# Patient Record
Sex: Female | Born: 1935 | Race: White | Hispanic: No | Smoking: Current every day smoker
Health system: Southern US, Community
[De-identification: ages and names within clinical notes are randomized; demographics above are authoritative.]

## PROBLEM LIST (undated history)

## (undated) DIAGNOSIS — K449 Diaphragmatic hernia without obstruction or gangrene: Secondary | ICD-10-CM

## (undated) DIAGNOSIS — I6529 Occlusion and stenosis of unspecified carotid artery: Secondary | ICD-10-CM

## (undated) DIAGNOSIS — K838 Other specified diseases of biliary tract: Secondary | ICD-10-CM

## (undated) DIAGNOSIS — I639 Cerebral infarction, unspecified: Secondary | ICD-10-CM

## (undated) DIAGNOSIS — R112 Nausea with vomiting, unspecified: Secondary | ICD-10-CM

## (undated) DIAGNOSIS — K8020 Calculus of gallbladder without cholecystitis without obstruction: Secondary | ICD-10-CM

## (undated) DIAGNOSIS — C801 Malignant (primary) neoplasm, unspecified: Secondary | ICD-10-CM

## (undated) DIAGNOSIS — Z9889 Other specified postprocedural states: Secondary | ICD-10-CM

## (undated) DIAGNOSIS — I700 Atherosclerosis of aorta: Secondary | ICD-10-CM

## (undated) DIAGNOSIS — I2510 Atherosclerotic heart disease of native coronary artery without angina pectoris: Secondary | ICD-10-CM

## (undated) DIAGNOSIS — E785 Hyperlipidemia, unspecified: Secondary | ICD-10-CM

## (undated) DIAGNOSIS — R51 Headache: Secondary | ICD-10-CM

## (undated) DIAGNOSIS — I219 Acute myocardial infarction, unspecified: Secondary | ICD-10-CM

## (undated) HISTORY — DX: Hyperlipidemia, unspecified: E78.5

## (undated) HISTORY — DX: Diaphragmatic hernia without obstruction or gangrene: K44.9

## (undated) HISTORY — DX: Other specified diseases of biliary tract: K83.8

## (undated) HISTORY — DX: Cerebral infarction, unspecified: I63.9

## (undated) HISTORY — PX: TONSILLECTOMY: SUR1361

## (undated) HISTORY — DX: Headache: R51

## (undated) HISTORY — PX: APPENDECTOMY: SHX54

## (undated) HISTORY — PX: ROTATOR CUFF REPAIR: SHX139

## (undated) HISTORY — PX: ANKLE FRACTURE SURGERY: SHX122

## (undated) HISTORY — DX: Calculus of gallbladder without cholecystitis without obstruction: K80.20

## (undated) HISTORY — DX: Atherosclerotic heart disease of native coronary artery without angina pectoris: I25.10

## (undated) HISTORY — PX: BLADDER SURGERY: SHX569

## (undated) HISTORY — PX: ABDOMINAL HYSTERECTOMY: SHX81

## (undated) HISTORY — DX: Acute myocardial infarction, unspecified: I21.9

## (undated) HISTORY — DX: Occlusion and stenosis of unspecified carotid artery: I65.29

## (undated) HISTORY — DX: Atherosclerosis of aorta: I70.0

---

## 1997-10-08 ENCOUNTER — Inpatient Hospital Stay (HOSPITAL_COMMUNITY): Admission: RE | Admit: 1997-10-08 | Discharge: 1997-10-10 | Payer: Self-pay | Admitting: Orthopedic Surgery

## 2001-05-22 DIAGNOSIS — I639 Cerebral infarction, unspecified: Secondary | ICD-10-CM

## 2001-05-22 HISTORY — DX: Cerebral infarction, unspecified: I63.9

## 2002-01-19 ENCOUNTER — Inpatient Hospital Stay (HOSPITAL_COMMUNITY): Admission: EM | Admit: 2002-01-19 | Discharge: 2002-01-24 | Payer: Self-pay

## 2002-01-21 ENCOUNTER — Encounter (INDEPENDENT_AMBULATORY_CARE_PROVIDER_SITE_OTHER): Payer: Self-pay | Admitting: Cardiology

## 2002-01-22 ENCOUNTER — Encounter: Payer: Self-pay | Admitting: Neurology

## 2002-01-23 ENCOUNTER — Encounter: Payer: Self-pay | Admitting: Neurology

## 2002-07-01 ENCOUNTER — Ambulatory Visit (HOSPITAL_COMMUNITY): Admission: RE | Admit: 2002-07-01 | Discharge: 2002-07-01 | Payer: Self-pay | Admitting: Neurology

## 2003-01-01 ENCOUNTER — Ambulatory Visit (HOSPITAL_COMMUNITY): Admission: RE | Admit: 2003-01-01 | Discharge: 2003-01-01 | Payer: Self-pay | Admitting: Neurology

## 2003-07-05 ENCOUNTER — Inpatient Hospital Stay (HOSPITAL_COMMUNITY): Admission: EM | Admit: 2003-07-05 | Discharge: 2003-07-09 | Payer: Self-pay | Admitting: Emergency Medicine

## 2003-10-14 ENCOUNTER — Ambulatory Visit (HOSPITAL_COMMUNITY): Admission: RE | Admit: 2003-10-14 | Discharge: 2003-10-14 | Payer: Self-pay | Admitting: Cardiology

## 2005-07-28 ENCOUNTER — Encounter: Admission: RE | Admit: 2005-07-28 | Discharge: 2005-07-28 | Payer: Self-pay | Admitting: Orthopedic Surgery

## 2006-09-04 ENCOUNTER — Ambulatory Visit: Payer: Self-pay | Admitting: Cardiology

## 2006-11-16 ENCOUNTER — Ambulatory Visit: Payer: Self-pay | Admitting: Vascular Surgery

## 2007-11-25 ENCOUNTER — Ambulatory Visit: Payer: Self-pay | Admitting: Cardiology

## 2007-11-27 ENCOUNTER — Ambulatory Visit: Payer: Self-pay | Admitting: Vascular Surgery

## 2008-06-01 ENCOUNTER — Encounter: Admission: RE | Admit: 2008-06-01 | Discharge: 2008-06-01 | Payer: Self-pay | Admitting: Family Medicine

## 2008-12-09 ENCOUNTER — Encounter: Payer: Self-pay | Admitting: Cardiology

## 2008-12-09 ENCOUNTER — Ambulatory Visit: Payer: Self-pay | Admitting: Vascular Surgery

## 2009-02-17 DIAGNOSIS — I6350 Cerebral infarction due to unspecified occlusion or stenosis of unspecified cerebral artery: Secondary | ICD-10-CM | POA: Insufficient documentation

## 2009-02-17 DIAGNOSIS — K2980 Duodenitis without bleeding: Secondary | ICD-10-CM | POA: Insufficient documentation

## 2009-02-17 DIAGNOSIS — I6529 Occlusion and stenosis of unspecified carotid artery: Secondary | ICD-10-CM | POA: Insufficient documentation

## 2009-02-17 DIAGNOSIS — I2510 Atherosclerotic heart disease of native coronary artery without angina pectoris: Secondary | ICD-10-CM | POA: Insufficient documentation

## 2009-02-17 DIAGNOSIS — E785 Hyperlipidemia, unspecified: Secondary | ICD-10-CM

## 2009-02-17 DIAGNOSIS — F411 Generalized anxiety disorder: Secondary | ICD-10-CM | POA: Insufficient documentation

## 2009-02-18 ENCOUNTER — Ambulatory Visit: Payer: Self-pay | Admitting: Cardiology

## 2009-02-18 DIAGNOSIS — Z720 Tobacco use: Secondary | ICD-10-CM

## 2009-03-03 ENCOUNTER — Ambulatory Visit (HOSPITAL_COMMUNITY): Admission: RE | Admit: 2009-03-03 | Discharge: 2009-03-03 | Payer: Self-pay | Admitting: Cardiology

## 2009-03-03 ENCOUNTER — Ambulatory Visit: Payer: Self-pay

## 2009-03-03 ENCOUNTER — Ambulatory Visit: Payer: Self-pay | Admitting: Cardiology

## 2009-03-03 ENCOUNTER — Encounter: Payer: Self-pay | Admitting: Cardiology

## 2009-03-16 ENCOUNTER — Telehealth: Payer: Self-pay | Admitting: Cardiology

## 2010-01-19 ENCOUNTER — Ambulatory Visit: Payer: Self-pay | Admitting: Vascular Surgery

## 2010-10-04 NOTE — Procedures (Signed)
CAROTID DUPLEX EXAM   INDICATION:  Followup carotid stenosis.   HISTORY:  Diabetes:  No.  Cardiac:  CAD with stent in 2005.  Hypertension:  No.  Smoking:  Yes.  Previous Surgery:  No.  CV History:  Currently asymptomatic.  Amaurosis Fugax No, Paresthesias No, Hemiparesis No                                       RIGHT             LEFT  Brachial systolic pressure:         134               130  Brachial Doppler waveforms:         Normal            Normal  Vertebral direction of flow:        Antegrade  DUPLEX VELOCITIES (cm/sec)  CCA peak systolic                   63  ECA peak systolic                   80  ICA peak systolic                   131  ICA end diastolic                   36  PLAQUE MORPHOLOGY:                  Mixed  PLAQUE AMOUNT:                      Mild  PLAQUE LOCATION:                    ICA   IMPRESSION:  1. Doppler velocity suggests a 40%-59% stenosis of the right proximal      internal carotid artery.  2. The left carotid system was not evaluated due to known functional      occlusion of the left internal carotid artery.  3. Mildly increased velocities of the right internal carotid artery      noted when compared to the previous exam on 12/09/2008.   ___________________________________________  Janetta Hora. Fields, MD   CH/MEDQ  D:  01/20/2010  T:  01/20/2010  Job:  [id number]

## 2010-10-04 NOTE — Assessment & Plan Note (Signed)
OFFICE VISIT   [name], [name]  DOB:  September 14, 1928                                       11/27/2007  ZOXWR#:[id number]   The patient returns for followup today.  She has had a previous left  internal carotid artery occlusion.  We have been following her for mild  asymptomatic right internal carotid artery disease.  She had a stroke in  August of 2003.  She has had no symptoms since then.  She states she  does get a little bit dizzy if she stands up too quickly, but otherwise  has been doing well.  She is trying to quit smoking.  She is down from 2  packs a day to 1/2 pack per day.  She continues to take 1 aspirin daily.   PHYSICAL EXAM:  Today blood pressure is 133/68 in the left arm, 147/64  in the right arm, pulse is 70 and regular.  HEENT is unremarkable.  She  has 2+ carotid pulses without bruit.  Chest:  Clear to auscultation.  Cardiac exam is regular rate and rhythm without murmur.  Abdomen is  soft, nontender without mass.  Extremities:  She has 2+ femoral pulses.   She had a repeat carotid duplex exam today which showed less than 40%  stenosis of the right internal carotid artery and then a chronic  occlusion of the left internal carotid artery.  This is essentially  unchanged from 1 year ago.  Overall, the patient is doing well.  Hopefully, she will be able to quit smoking.  She will continue her  aspirin daily.  Will repeat her duplex exam in 1 year's time.   Cc Dr. Jeannetta Nap and Dr. Riley Kill office note and duplex report.   Janetta Hora. Fields, MD  Electronically Signed   CEF/MEDQ  D:  11/27/2007  T:  11/28/2007  Job:  [id number]   cc:   Arturo Morton. Riley Kill, MD, [facility]  Windle Guard, M.D.

## 2010-10-04 NOTE — Procedures (Signed)
CAROTID DUPLEX EXAM   INDICATION:  Followup, carotid artery disease, known left ICA occlusion.   HISTORY:  Diabetes:  No.  Cardiac:  CAD.  Hypertension:  No.  Smoking:  No.  Previous Surgery:  No.  CV History:  Left CVA in 2003.  Amaurosis Fugax No, Paresthesias No, Hemiparesis No.                                       RIGHT             LEFT  Brachial systolic pressure:         162               160  Brachial Doppler waveforms:         Normal            Normal  Vertebral direction of flow:        Antegrade         Antegrade  DUPLEX VELOCITIES (cm/sec)  CCA peak systolic                   79                84  ECA peak systolic                   91                107  ICA peak systolic                   99                Occluded  ICA end diastolic                   23  PLAQUE MORPHOLOGY:                  Heterogenous      Calcific  PLAQUE AMOUNT:                      Mild              Moderate-to-severe  PLAQUE LOCATION:                    ICA/ECA           ICA   IMPRESSION:  1. 1-39% stenosis of the right internal carotid artery.  2. Known occlusion of the left internal carotid artery, by      arteriogram, with elevated velocities of >250 cm/s noted in the      left proximal internal carotid artery region, which appeared to be      a possible collateral vessel.  3. No significant change noted from the previous examination on      11/16/06.   ___________________________________________  Janetta Hora. Fields, MD   CH/MEDQ  D:  11/27/2007  T:  11/27/2007  Job:  [id number]

## 2010-10-04 NOTE — Letter (Signed)
November 25, 2007    Windle Guard, M.D.  [address]  [place], [place] [address].   RE:  [name], [name]  MRN:  [id number]  /  DOB:  08-12-1928   Dear Andrey Campanile:   I had the pleasure of seeing [name] in the office today in  followup.  As you know, she continues to smoke about half a pack of  cigarettes a day.  She does have carotid disease and is scheduled to see  Dr. Darrick Penna.  She gave me her lipid numbers, and these look really quite  encouraging.  I know she is on an aggressive lipid regimen which  includes small doses of light niacin, lovastatin as well as gemfibrozil.  Her HDL was 79, LDL 69, and triglycerides 54.   PHYSICAL EXAMINATION:  VITALS:  Today on physical, the blood pressure  was 160/80, but then when rechecked by me it was 140/80 with a pulse of  68.  HEENT:  Interestingly, I could not hear loud carotid bruits.  LUNGS:  The lung fields were clear to auscultation and percussion with  prolonged expiration.  HEART:  Cardiac rhythm was regular.   The electrocardiogram demonstrates normal sinus rhythm with criteria for  LVH voltage-wise, but without other changes.   Overall, she seems to be doing well from a cardiac standpoint.  Her  lipids are relatively optimal.  She had a previously placed non drug-  eluting stent for acute myocardial infarction.  She does need follow up  on her carotids and this will be done soon.  With regard to her lipid  situation, I do think that perhaps she might be able to get by without  her gemfibrozil, but certainly would leave this up to your  recommendations.   I appreciate the opportunity of sharing this nice lady's care.    Sincerely,       Arturo Morton. Riley Kill, MD, [facility]      Arturo Morton. Riley Kill, MD, [facility]    TDS/MedQ  DD: 11/25/2007  DT: 11/25/2007  Job #: [id number]

## 2010-10-04 NOTE — Procedures (Signed)
CAROTID DUPLEX EXAM   INDICATION:  Followup evaluation of known carotid artery disease.  The  patient has a known left ICA occlusion by angiogram.   HISTORY:  Diabetes:  No.  Cardiac:  Coronary artery disease stent on 02/02/2004.  Hypertension:  No.  Smoking:  Yes.  Previous Surgery:  No.  CV History:  The patient had a stroke in 2003.  Previous duplex on  11/27/2007 revealed a 1-39% right ICA stenosis and an occlusion at the  left ICA.  The patient reports no cerebrovascular symptoms at this time.  Amaurosis Fugax No, Paresthesias No, Hemiparesis No                                       RIGHT             LEFT  Brachial systolic pressure:         168               172  Brachial Doppler waveforms:         Triphasic         Triphasic  Vertebral direction of flow:        Antegrade         Antegrade  DUPLEX VELOCITIES (cm/sec)  CCA peak systolic                   71                56  ECA peak systolic                   90                85  ICA peak systolic                   106               Occluded  ICA end diastolic                   33                Occluded  PLAQUE MORPHOLOGY:                  Mixed             Mixed  PLAQUE AMOUNT:                      Mild              Severe  PLAQUE LOCATION:                    Proximal ICA      Throughout ICA   IMPRESSION:  1. 20-39% right ICA stenosis.  2. Although the left ICA is functionally occluded by angiogram there      is a very small flow channel with a peak systolic velocity of 381      cm/second and end diastolic velocity of 142 cm/second.  3. No significant change from previous study.  4. Dr. Darrick Penna was notified of preliminary results and a followup      appointment was scheduled for 12 months.   ___________________________________________  Janetta Hora. Fields, MD   MC/MEDQ  D:  12/09/2008  T:  12/09/2008  Job:  [id number]   cc:  Arturo Morton. Riley Kill, MD, [facility]  Dr Shelah Lewandowsky

## 2010-10-07 NOTE — H&P (Signed)
NAME:  [name], [name]                       ACCOUNT NO.:  [id number]   MEDICAL RECORD NO.:  [id number]                   PATIENT TYPE:  INP   LOCATION:  1825                                 FACILITY:  [facility]   PHYSICIAN:  Doylene Canning. Ladona Ridgel, M.D.               DATE OF BIRTH:  01-Feb-1929   DATE OF ADMISSION:  07/05/2003  DATE OF DISCHARGE:                                HISTORY & PHYSICAL   ADMISSION DIAGNOSIS:  Chest pain/right upper quadrant pain consistent with  non-Q-wave myocardial infarction.   HISTORY OF PRESENT ILLNESS:  The patient is a 77 year old woman with a  history of known cerebrovascular disease followed by Dr. Windle Guard.  She  is status post stroke.  She has hyperlipidemia.  She was in her usual state  of health until last night when she developed right upper quadrant and  substernal chest pain radiating into the neck and jaw.  It also radiated  into her shoulders.  It was associated with shortness of breath and severe  nausea and vomiting.  The discomfort was waxing and waning; however, it was  always present.  Today she called her primary physician who instructed her  to come to the emergency room for additional evaluation and treatment.  She  denies syncope.  She denies chronic cough.  She does have diaphoresis  associated with her nausea and vomiting.   PAST MEDICAL HISTORY:  1. As previously noted.  In addition there is history of anxiety disorder.  2. She is status post hysterectomy in the past.  3. She is status post rotator cuff surgery.   SOCIAL HISTORY:  The patient lives in [place].  She has a long tobacco  abuse history, stopping smoking two years ago.  She denies alcohol abuse.   FAMILY HISTORY:  Notable for coronary disease.   REVIEW OF SYSTEMS:  The patient had some fevers several days ago.  Those have resolved.  She  denies chills.  She does have diaphoresis and sweating with her symptoms as  previously noted.  She denies headache, vision  or hearing problems.  She  denies any skin rashes.  She denies shortness of breath except with her  chest pressure.  She denies claudication or wheezing.  She denies cough or  syncope as noted.  She denies dysuria , hematuria, or nocturia.  She denies  any weakness, numbness, or anxiety problems.  She denies any arthralgias or  other joint aches.  She has nausea and vomiting as previously noted.  She  denies any reflux symptoms.  She denies polyuria or polydipsia.  The rest of  the review of systems was negative.   PHYSICAL EXAMINATION:  GENERAL:  On exam she is a pleasant, ill-appearing,  elderly-appearing woman in no distress.  She looks somewhat older than her  stated age.  VITAL SIGNS:  Blood pressure was 115/62, the pulse was 62 and regular, the  respirations were 20.  Temperature was 98.4.  HEENT:  Normocephalic, atraumatic.  Pupils equal and round.  The oropharynx  was moist.  The sclerae were anicteric.  NECK:  Revealed no jugular venous distention.  There is no thyromegaly.  The  trachea was midline.  CARDIOVASCULAR:  Revealed a regular rate and rhythm with normal S1 and S2.  I could not appreciate any obvious murmurs or gallops.  LUNGS:  Clear bilaterally to auscultation.  There are somewhat decreased  breath sounds throughout.  ABDOMEN:  Soft.  Nontender, nondistended.  There is no organomegaly.  She  did have some tenderness in the right upper quadrant.  EXTREMITIES:  There was no cyanosis, clubbing, or edema noted.  Pulses were  1+ and symmetric in the lower extremities.  NEUROLOGIC:  Grossly nonfocal.  All extremities moved well.   LABORATORY DATA:  Demonstrates normal sinus rhythm with 1 mm of ST segment  depression in the high lateral leads as well as the lateral leads.   Initial labs demonstrated a hemoglobin of 13, a white count of 10.9.  Potassium 3.2, creatinine 0.9.  Initial I stat MB was 80 and troponin was  4.7.  Other labs were within normal limits with the  exception of the AST  which was 111.   IMPRESSION:  1. Acute non-Q-wave myocardial infarction.  2. Probable upper gastrointestinal bleed with stable hemoglobin at present.  3. Gallbladder disease.  4. Hyperlipidemia.  5. Cerebrovascular disease.   DISCUSSION:  We plan to admit the patient to the hospital.  Will confirm her  abnormal cardiac enzymes with a serum draw.  Different enzymes continue to  be elevated and she continues to have symptoms and we would proceed with  urgent catheterization and possible coronary intervention.  If her next set  of cardiac enzymes is in fact borderline and were normal, then we would not  in fact proceed with intervention secondary to concerns about worsening her  presently __________GI bleeding.  Because of the question for bleeding at  present will hold heparin, although this may be required if she undergoes  catheterization.  Will also start her on hydrogen pump blocker to treat any  acid reflux disease.                                                Doylene Canning. Ladona Ridgel, M.D.    GWT/MEDQ  D:  07/05/2003  T:  07/05/2003  Job:  [id number]   cc:   Windle Guard, M.D.  [address]  [place], [place] [address]  Fax: [phone]

## 2010-10-07 NOTE — Discharge Summary (Signed)
NAME:  [name], BERGGREN                       ACCOUNT NO.:  [id number]   MEDICAL RECORD NO.:  [id number]                   PATIENT TYPE:  INP   LOCATION:  3705                                 FACILITY:  [facility]   PHYSICIAN:  Rene Paci, M.D. [facility]          DATE OF BIRTH:  [birth year]   DATE OF ADMISSION:  07/05/2003  DATE OF DISCHARGE:  07/09/2003                           DISCHARGE SUMMARY - REFERRING   DISCHARGE DIAGNOSES:  1. Non-ST segment elevated myocardial infarction, inferior wall.  2. Mild duodenitis.  3. Former tobacco abuse.  4. Status post hysterectomy.  5. Status post rotator cuff surgery.  6. History of anxiety disorder.  7. History of cerebral vascular accident.  8. History of hyperlipidemia.   HOSPITAL COURSE:  Ms. [name] is a 77 year old female with a known history  of cerebrovascular disease as well as  hyperlipidemia. On the evening prior  to admission, she developed right upper quadrant and subsequent chest pain  radiating into the neck and jaw as well as into her shoulders. She did have  accompanying shortness of breath as well as nausea and vomiting. On July 05, 2003, she called her primary physician who directed her to the emergency  room. Her EKG on presentation revealed a normal sinus rhythm with 1-mm ST  segment depression in the high lateral leads as well as the lateral leads.  Troponin was 4.7.   Of note, the patient did complain of nausea and vomiting within 24 hours  prior to the admission and did note coffee-ground material. Ultimately, a GI  consult was obtained under the care of [facility], Dr. Arlyce Dice, and  mild duodenitis was noted.   The patient underwent emergent cardiac catheterization on July 05, 2003  under the care of Dr. Shawnie Pons. EF was noted to be 48% with mild MVP  and mild MR. The right coronary artery revealed a subtotal lesion 99% which  was reduced to 0% lesion post procedure with a nondrug-eluding  stent. This  type of stent was utilized secondary to the probable GI bleed with coffee-  ground material, even though the hemoglobin was stable. Otherwise, the  patient's blood vessels revealed a LAD with two mid 30% lesions. The  circumflex had a 70% ostial marginal lesion.   The patient continued to progress well over the next several days.  Ambulation was reinforced, and by July 09, 2003, the patient was ready  for discharge to home.   LABORATORY DATA:  Lab studies during this stay included hemoglobin of 10.6,  hematocrit 31.3, white count 6.9, platelets 267. Hemoccult negative. Sodium  128, potassium 3.5, BUN 11, creatinine 0.9. Maximum CK 18.01 with 236 MB  fraction, maximum troponin of 53.39. BNP 435. Total cholesterol 190,  triglycerides 89, HDL 77, LDL 95.   DISCHARGE INSTRUCTIONS:  The patient discharge medications include enteric-  coated aspirin 325 mg a day, Plavix 75 mg a day. The patient will need to  remain  on this combination for one year. In addition, Toprol-XL 25 mg a day.  She is to continue the Lexapro, calcium, Lopid, Lipitor, Xanax, and  multivitamins. She is given a prescription for sublingual nitroglycerin  p.r.n. chest pain. No strenuous activity. Will resume activity as per  cardiac rehab. Remain on a low fat diet. Clean over catheter site with soap  and water, no scrubbing. Call for questions or concerns. She does have a  followup appointment with Dr. Riley Kill on July 31, 2003 at 3 p.m.   In addition, we have ordered Pepcid for this patient for the mild  duodenitis, and she will need to remain on this for at least one year while  she is on the aspirin and Plavix.      Guy Franco, P.A. [facility]                      Rene Paci, M.D. [facility]    LB/MEDQ  D:  07/09/2003  T:  07/09/2003  Job:  [id number]   cc:   Arturo Morton. Riley Kill, M.D.   Windle Guard, M.D.  [address]  [place], [place] [address]  Fax: [phone]

## 2010-10-07 NOTE — Cardiovascular Report (Signed)
NAME:  [name], [name]                       ACCOUNT NO.:  [id number]   MEDICAL RECORD NO.:  [id number]                   PATIENT TYPE:  INP   LOCATION:  1825                                 FACILITY:  [facility]   PHYSICIAN:  Arturo Morton. Riley Kill, M.D.             DATE OF BIRTH:  1929/05/11   DATE OF PROCEDURE:  07/05/2003  DATE OF DISCHARGE:                              CARDIAC CATHETERIZATION   INDICATIONS FOR PROCEDURE:  [name] is a 77 year old woman who presents  with enzyme positive non ST elevation MI. She was seen in the emergency room  by Dr. Ladona Ridgel and set up for cardiac catheterization. At the time in the  emergency room she had vomiting which consisted of a small  amount of blood.  However, her enzymes were markedly positive and she was brought to the  catheterization laboratory  for urgent evaluation. The  risks, benefits and  alternatives were discussed  with the patient in the catheterization  laboratory by me and previously by Dr. Ladona Ridgel.   PROCEDURE:  1. Left heart catheterization.  2. Selective coronary arteriography.  3. Selective left ventriculography.  4. Percutaneous transluminal coronary angioplasty and stenting of the right     coronary artery using a non DES platform secondary to GI bleed.   DESCRIPTION OF PROCEDURE:  The patient was brought to the catheterization  laboratory  and prepped and draped in the usual fashion. Through an anterior  puncture the right femoral artery was easily entered. A 6 French sheath was  then placed. Views of the left and right coronary arteries were then  obtained in multiple angiographic projections. There was slow antegrade flow  in the right coronary artery. Ventriculography was performed in the RAO  projection. We got the IV team to start a 2nd IV.   A JR-4 6 [place] guiding catheter with sideholes was then utilized. We gave  bivalirudin with the hope that this could be discontinued quickly. The  artery was then crossed  with a high-torque floppy wire. Initial dilatation  was done with a 2.25 x 20 Maverick balloon at 8 atmospheres. The vessel then  opened.   The patient developed significant bradycardia  with reperfusion. She was  given 0.6 mg of Atropine with eventual rise in blood pressure and heart  rate. Stenting was then performed using a 2.75 x 28 Express 2 non drug  eluding stent. This was taken up to 15 atmospheres. Post dilatations were  done with a 3.25-mm Power Sail balloon. Several inflations were performed to  document complete apposition of the stent. The Power Sail was subsequently  removed.   All catheters were subsequently removed and the femoral sheaths sewn into  place. The Angiomax was discontinued. She was taken to the surgical  intensive care unit in satisfactory clinical condition, due to the absence  of a bed in the coronary care unit.   HEMODYNAMIC DATA:  1. Central aortic pressure 165/83.  2. Left ventricle 143/11.  3. No gradient on pullback across the aortic valve.   ANGIOGRAPHIC DATA:  1. Ventriculography was performed in the RAO projection. There was mild     mitral valve prolapse and mild mitral regurgitation. There was     inferobasilar hypo to akinesis. Ejection fraction was calculated at 48%.  2. The left main coronary artery was free of critical disease and had mild     calcification.  3. The left anterior descending coronary artery coursed to the apex. Beyond     the origin of the diagonal there was about 30% narrowing and then a 30%     to 50% area beyond this in a bend point in the vessel. There was slight     hypodensity at this location. There was a major diagonal branch with some     mild proximal irregularity but without high-grade stenosis.  4. There was a ramus intermediate without significant narrowing.  5. The circumflex provides a small  marginal branch and then a large     bifurcating marginal branch as well as a posterolateral branch. The      marginal branch has 70% narrowing but is a relatively  small  vessel.  6. The right coronary artery is large caliber vessel. Just beyond the origin     of the SA nodal artery is a subtotal occlusion of the vessel in the mid     vessel with TIMI 1 flow. Following  balloon  dilatation and stenting,     this was reduced to 0% with TIMI 3 flow into the distal vessel. The     distal vessel was without critical narrowing.   CONCLUSION:  1. Moderate reduction in LV function with inferobasal wall motion     abnormality.  2. Subtotal occlusion of the right coronary artery with TIMI 1 flow  with     subsequent percutaneous stenting using a non drug-eluding stent because     of the recent  history of GI bleed.  3. Scattered abnormalities of the left coronary system as noted above.   DISPOSITION:  The patient will be treated with beta blockers, hopefully  aspirin and clopidogrel. A GI consult will be obtained. She will be started  on intravenous Protonix as an 80 mg dose, then an 8 mg per hour drip. This  will eventually be discontinued. Hopefully the patient will stabilize.  Angiomax has been totally discontinued. She will be off Angiomax at the  present time. She will need no other  intravenous anticoagulations.  Unfortunately she has been on Plavix continuously and this should provide a  small  amount of buffer with regard to antiplatelet therapy in the short  run. Four weeks of antiplatelet therapy should be adequate with regard to  Plavix if there are continued problems with GI bleeding.                                               Arturo Morton. Riley Kill, M.D.    TDS/MEDQ  D:  07/05/2003  T:  07/05/2003  Job:  [id number]   cc:   Doylene Canning. Ladona Ridgel, M.D.   Windle Guard, M.D.  [address]  [place], [place] [address]  Fax: [phone]

## 2010-10-07 NOTE — H&P (Signed)
NAME:  [name], [name]                       ACCOUNT NO.:  [id number]   MEDICAL RECORD NO.:  [id number]                   PATIENT TYPE:  EMS   LOCATION:  MAJO                                 FACILITY:  [facility]   PHYSICIAN:  Marlan Palau, M.D.               DATE OF BIRTH:  August 04, 1928   DATE OF ADMISSION:  01/19/2002  DATE OF DISCHARGE:                                HISTORY & PHYSICAL   HISTORY OF PRESENT ILLNESS:  The patient is a 77 year old right handed white  female born Feb 22, 1929, with a history of tobacco abuse; otherwise  without significant past medical history.  Her primary physician is Dr.  Windle Guard.  This patient comes to the [facility] Emergency Room for an  evaluation of undulating numbness of the right upper extremity that has been  occurring for the last 10 days.  On the evening prior to this admission, the  patient developed some weakness of the right arm and right leg with  inability to walk, inability to use the right arm effectively, increased  numbness, no headache, no visual field changes noted.  The patient improved  a bit but noted some worsening this morning.  The patient has come to the  emergency room and again has noted some improvement in her deficit.  A CT  scan of the brain has been performed showing bilateral deep white matter  disease, left greater than right.  No definite acute changes are seen.  No  intracranial hemorrhages seen.  The patient is being admitted for further  evaluation of an unstable undulating neurologic deficit.   PAST MEDICAL HISTORY:  1. New onset of right hemiparesis that is undulating in severity.  2. Tobacco abuse.  3. Right cataract surgery.  4. History of anemia.  5. History of right ankle fracture status post surgery in the past.  6. History of rotator cuff tear repair on the left.  7. History of bladder resuspension procedure in the past.  8. History of hysterectomy.   MEDICATIONS:  The patient was just  recently started on Celebrex.  The  patient was not on aspirin on a regular basis prior to this admission.   ALLERGIES:  DEXEDRINE.   SOCIAL HISTORY:  Smokes a half-pack of cigarettes a day.  Drinks alcohol on  occasion.  This patient is married, lives in the [place] area.  Is  essentially retired.  Has three children, four stepchildren.  One child died  with leukemia.   FAMILY MEDICAL HISTORY:  Notable for the fact that the mother died with an  MI and stroke.  Father died with senile dementia of Alzheimer's type and  pneumonia, stroke.  The patient has one sister who has had a stroke.   REVIEW OF SYSTEMS:  Notable for feeling hot last evening.  The patient  denies headache, has slight neck pain.  Denies shortness of breath.  Denies  chest pain.  Denies any problems in controlling the bowels or the bladder.  Has had some dizziness.  Denies any blackout episodes.   PHYSICAL EXAMINATION:  VITAL SIGNS:  Blood pressure is 165/67.  Heart rate  is 97.  Respiratory rate 20.  Temperature afebrile.  GENERAL:  This patient is fairly well-developed white female who is alert,  cooperative at the time of examination.  HEENT:  Head is atraumatic.  Eyes:  Pupils equal, round, and reactive to  light.  Disks:  Soft, flat bilaterally, cataract is present on the left, not  on the right.  NECK:  Supple, no carotid bruits noted.  RESPIRATORY:  Clear.  CARDIOVASCULAR:  Regular rate and rhythm without obvious murmurs or rubs  noted.  ABDOMEN:  Reveals positive bowel sounds, no organomegaly or tenderness  noted.  EXTREMITIES:  Without significant edema.  NEUROLOGIC:  Cranial nerves as above.  Facial symmetry is present.  The  patient has good pinprick sensation on face bilaterally.  Has good full  visual fields.  Double simultaneous stimulation.  Speech is well enunciated,  not aphasic.  Motor test was 5/5 strength in all four extremities.  Good  symmetric motor tone is noted throughout. Sensory  testing is intact to  pinprick, soft touch, vibratory sensation throughout.  The patient has good  finger-nose-finger bilaterally, good toe-to-finger bilaterally.  Deep tendon  reflexes somewhat depressed but symmetric and equivocal.  Toe upgoing on the  right and downgoing on the left.  The patient is not ambulated.   LABORATORY DATA:  Notable for a sodium of 139, potassium 4.1, chloride of  104, CO2 27, glucose 102, BUN of 14, creatinine 0.9, calcium 9.3, white  count 6.9, hemoglobin 13.1, hematocrit of 38.1, MCV of 87.0, platelets of  211, INR 0.9.   CT of the head shows small vessel disease, left greater than right, as  above.  EKG has been performed and shows normal sinus rhythm with  nonspecific ST and T wave abnormalities.  Heart rate is 87.  Chest x-ray is  pending.   IMPRESSION:  1. Left brain transient ischemic attack versus stroke.  2. Tobacco abuse.   DISCUSSION:  This patient has had an unstable neurologic picture with  undulating deficits of numbness, weakness on the right body.  Do need to  consider large vessel occlusion in this case.  Will pursue further workup at  this point.  Heparin is indicated at this time.   PLAN:  1. Admission to [facility].  2. IV heparin therapy.  3. MRI scan of the brain.  4. MI angiogram, intracranial and carotid vessels.  5. Follow patient's clinical course in house.  6. A 2D echocardiogram will be performed.                                               Marlan Palau, M.D.    CKW/MEDQ  D:  01/19/2002  T:  01/20/2002  Job:  [id number]   cc:   Windle Guard, M.D.   [facility]

## 2010-10-07 NOTE — Assessment & Plan Note (Signed)
[facility]                            CARDIOLOGY OFFICE NOTE   NAME:[name], Martha GUIDO                    MRN:          [id number]  DATE:09/04/2006                            DOB:          07/30/1928    Ms. Vosler is in for a followup visit. She rarely if ever gets any  chest discomfort. She occasionally has an episode of dizziness. She has  bilateral carotid disease and is scheduled to see Dr. Darrick Penna in a  regular followup visit soon. She remains on aspirin currently. She does  unfortunately continue to smoke. She had stopped but has resumed this.   The patient previously underwent stenting with a non drug-eluting stent.  This was done in the setting of an acute infarct and the patient had  subtotal occlusion but she also had vomiting of bloody material and  underwent upper endoscopy at the time of her infarct. She is having  regular lab studies done with Dr. Windle Guard. Her sugar was recently  noted to be elevated at 120 mg/DL.   PHYSICAL EXAMINATION:  VITAL SIGNS:  Today on examination, the blood  pressure was 130/70, the pulse was 89.  LUNGS:  The lung fields reveal prolonged expiration.  CARDIAC:  There is not a significant murmur noted. She does have soft  bilateral carotid bruits.   The patient has had previous CT scanning that demonstrated no evidence  of a subdural hematoma that was obtained 5 days after a fall. She did  have scattered low density in the periventricular what matter which was  nonspecific but suggestive of probably small vessel ischemic change.   Today her EKG reveals sinus rhythm with minor nonspecific ST  abnormality.   IMPRESSION:  1. Coronary artery disease status post percutaneous coronary      intervention with non drug-eluting platform for acute myocardial      infarction.  2. Bilateral carotid artery disease.  3. Hyperlipidemia on lipid-lowering therapy.  4. Borderline sugar.  5. Continued tobacco  use.   RECOMMENDATIONS:  1. Strong recommendation to followup with Dr. Darrick Penna.  2. Continued strong recommendation to followup with Dr. Jeannetta Nap for      lipid management.  3. Discontinuation of cigarette smoking.     Arturo Morton. Riley Kill, MD, [facility]  Electronically Signed   TDS/MedQ  DD: 09/04/2006  DT: 09/04/2006  Job #: [id number]

## 2010-10-07 NOTE — Discharge Summary (Signed)
NAME:  [name], [name]                       ACCOUNT NO.:  [id number]   MEDICAL RECORD NO.:  [id number]                   PATIENT TYPE:  INP   LOCATION:  3025                                 FACILITY:  [facility]   PHYSICIAN:  Casimiro Needle L. Thad Ranger, M.D.           DATE OF BIRTH:  [birth year]   DATE OF ADMISSION:  01/19/2002  DATE OF DISCHARGE:  01/24/2002                                 DISCHARGE SUMMARY   DISCHARGE DIAGNOSES:  1. Left frontoparietal scattered white matter infarctions.  2. Left intracranial artery total occlusion.  3. Right carotid stenosis at 25 percent.  4. Hyperlipidemia.  5. Hypertension.  6. Tobacco abuse.  7. History of right cataract surgery.  8. History of anemia.  9. History of right ankle fracture, status post surgery in the pat.  10.History of rotator cuff tear repair on the left.  11.History of bladder resuspension procedure in the past.  12.History of hysterectomy.   DISCHARGE MEDICATIONS:  1. Aspirin 325 mg q.d.  2. Plavix 75 mg q.d.  3. Lipitor 10 mg q.d.  4. Pain management, not applicable.   DISCHARGE ACTIVITIES:  No restrictions; however, no driving times 1 week.   DISCHARGE DIET:  Low-fat, low-salt, regular consistency thin liquids.   WOUND CARE:  Not applicable.   STUDIES PERFORMED:  1. CT of the head performed 01/19/2002 showing scattered paraventricular and     subcortical white matter disease.  No acute strokes seen.  2. MRI of the brain 01/22/2002 showing several small acute nonhemorrhagic     left frontal lobe infarcts along the left middle cerebral artery     distribution, suspicious for embolic disease.  Atrophy.  3. MRI of the brain performed 01/22/2002 showing severe intracranial     atherosclerotic disease most notable along the left internal carotid     artery distribution which appears that the left internal carotid artery     is occluded or severely narrowed.  Several __________ stenoses along the     portion of the left  anterior circulation.  Significant stenosis also     noted in the M1 segment of the right MCA and posterior cerebral arteries.  4. MRA of the neck showing left ICA occlusion beyond its origin.  No String     sign noted; however, it cannot be excluded, 50 percent stenosis of the     proximal right ICA, high grade focal stenosis left vertebral artery,     focal stenosis proximal right subclavian artery, co-dominant     vertebrobasilar system.  5. A 2D echocardiogram performed 01/21/2002 showed ejection fraction of 55-     65 percent.  Inadequate evaluation of left ventricular regional wall     abnormalities.  No embolic source identified.  6. Carotid doppler performed 01/21/2002 showing left ICA occlusion, right 40-     60 percent ICA.  Vertebral artery flow antegrade on the left.  Right     vertebral artery  flow stenosis.  7. Cerebral angiogram performed 01/23/2002 by Dr. Kerby Nora, appearing     better than expected.  Left ICA was totally occluded.  Right ICA was 25-     30 percent stenosed.  No focal vertebral disease.  Does appear better     than MRA.  8. Chest x-ray.  It was performed 01/19/2002 showing COPD, ? mild pulmonary     vascular congestion; however, no overt CHF.  9. EKG performed 01/19/2002 showing normal sinus rhythm with nonspecific ST     and T wave abnormalities.   LABORATORY DATA:  CBC was normal.  Differential normal.  Coagulation studies  normal.  Admission chemistry also normal with CMET at discharge showing  sodium 136, potassium 3.7, chloride 103, CO2 26, BUN 12, creatinine 1.0.  Glucose 118.  Note liver functions were slightly elevated with SGOT of 48,  SGPT of 42, total bilirubin normal at 0.5, alkaline phosphatase normal at 88  and albumin 3.4.  Cholesterol was elevated 283, triglycerides elevated at  428, HDL normal at 47, LDL unable to be calculated.   HISTORY OF PRESENT ILLNESS:  [name] is a 77 year old right-  handed white female with a  history of tobacco abuse.  She comes to the [facility] Emergency Room for evaluation of undulating numbness of the right upper  extremity that has been occurring for the past 10 days.  On the evening  prior to admission, the patient developed some weakness of the right arm and  right leg with inability to walk, inability to use the right arm  effectively, increased numbness.  It improved a bit but noticed increased  worsening in the morning.  By the time the patient got to the emergency room  the patient again had had some improvement in her deficit.  CT of the brain  showed some bilateral deep white matter disease, left greater than right,  but no acute stroke.  No acute intracranial hemorrhage.  The patient was  admitted for further evaluation.   HOSPITAL COURSE:  MRI did reveal acute stroke in the left frontoparietal  white matter.  MRA revealed multiple stenoses including a left ICA occlusion  and significant stenosis in the right ICA.  Followup cerebral angiogram by  Dr. Kerby Nora revealed stenosis to be not as severe as originally felt,  but did have a totally occluded left ICA and a 25-30 percent right ICA.  Her  symptoms of stroke including her numbness and weakness resolved during  hospitalization.  It was decided to place the patient on aspirin and Plavix  for stroke prophylaxis and followup dopplers in 6 months to 1 year   The patient has multiple secondary risks factors including elevated  cholesterol and triglycerides, smoking history, and hypertension.  The  patient was started on Lipitor 10 mg p.o. q.d. in the hospital, will follow  up labs in 4-6 weeks.  Consider increasing Lipitor dose at that time.  The  patient was encouraged to stop smoking, stay hydrated, and take it easy for  the next few weeks.  Will follow back in the office and also will see her  primary physician.   CONDITION ON DISCHARGE:  The patient alert and oriented x3.  Speech clear. No aphagia.  No  facial weakness.  Strength is normal.  No ataxia.  Gait is  steady.  Sensation intact.  Chest is clear.  Heart rate is regular.   DISCHARGE INSTRUCTIONS:  1. Discharge home.  2. Aspirin  and Plavix for stroke prophylaxis.  3. New statin this admission.  Will need followup labs in 4-6 weeks with     probable increase in statin dose.  4. Stop smoking.  5.     Followup with primary physician, please make an appointment.  6. Followup with [facility].  Make an appointment with Demetrio Lapping, PA on the day that Dr. Thad Ranger is there in 4-6 weeks.     Annie Main, NP                           Marolyn Hammock. Thad Ranger, M.D.    SB/MEDQ  D:  01/24/2002  T:  01/26/2002  Job:  [id number]   cc:   Casimiro Needle L. Thad Ranger, M.D.  [address]  [place]  [place] [address]  Fax: [phone]   Windle Guard, M.D.

## 2011-01-03 ENCOUNTER — Encounter: Payer: Self-pay | Admitting: Vascular Surgery

## 2011-01-18 ENCOUNTER — Encounter: Payer: Self-pay | Admitting: Vascular Surgery

## 2011-01-19 ENCOUNTER — Ambulatory Visit (INDEPENDENT_AMBULATORY_CARE_PROVIDER_SITE_OTHER): Payer: Medicare Other | Admitting: Vascular Surgery

## 2011-01-19 ENCOUNTER — Encounter: Payer: Self-pay | Admitting: Vascular Surgery

## 2011-01-19 VITALS — BP 157/82 | HR 80 | Ht 61.5 in | Wt 138.0 lb

## 2011-01-19 DIAGNOSIS — I6529 Occlusion and stenosis of unspecified carotid artery: Secondary | ICD-10-CM

## 2011-01-19 NOTE — Progress Notes (Signed)
Patient is an 77 year old female with known left internal carotid artery occlusion. We have been following her for a moderate right carotid stenosis. She denies any symptoms of TIA amaurosis or stroke. Chronic medical problems include elevated cholesterol hypertension and tobacco abuse. These problems are currently stable and followed by her primary care physician. She was previously taking Plavix but now on aspirin therapy alone.  Review of systems: She denies any shortness of breath or chest pain.   Physical exam: Blood pressure 157/82, pulse 80, height 5' 1.5" (1.562 m), weight 138 lb (62.596 kg), SpO2 98.00%.  HEENT: Negative  Chest: Clear to auscultation bilaterally  Cardiac: Regular rate and rhythm without murmur  Neck: 2+ carotid pulses no bruit  Peripheral vascular: 2+ radial pulses bilaterally  Noninvasive vascular exam: Patient had a carotid duplex exam today which I reviewed and interpreted. This shows a 40-60% right internal carotid artery stenosis. Patient has a known left internal carotid artery occlusion from 2003.  Assessment: Moderate right internal carotid artery stenosis unchanged. She is asymptomatic.  Plan: The patient has cut back on her smoking and will continue to try to quit smoking.  She will have a followup carotid duplex exam in one year.  She will return for followup sooner if she has any symptoms of TIA amaurosis or stroke.  She'll continue to take her aspirin daily.

## 2011-02-09 NOTE — Procedures (Unsigned)
CAROTID DUPLEX EXAM  INDICATION:  Followup carotid stenosis.  HISTORY: Diabetes:  No. Cardiac:  Stent, CAD. Hypertension:  Yes. Smoking:  Currently. Previous Surgery:  No carotid intervention, known left internal carotid artery occlusion by MRA on 01/23/2002. CV History:  2003 CVA. Amaurosis Fugax No, Paresthesias No, Hemiparesis No                                      RIGHT             LEFT Brachial systolic pressure:         138               138 Brachial Doppler waveforms:         WNL               WNL Vertebral direction of flow:        Antegrade DUPLEX VELOCITIES (cm/sec) CCA peak systolic                   92 ECA peak systolic                   114 ICA peak systolic                   159 ICA end diastolic                   41 PLAQUE MORPHOLOGY:                  Heterogeneous PLAQUE AMOUNT:                      Moderate PLAQUE LOCATION:                    CCA/ICA  IMPRESSION: 1. Right internal carotid artery stenosis in the 40%-59% range. 2. Right external carotid artery appears patent. 3. Right vertebral artery is patent and antegrade. 4. Left internal carotid artery known occlusion and not evaluated on     today's study. 5. Essentially unchanged since the previous study on 01/19/2010.  ___________________________________________ Janetta Hora. Fields, MD  SH/MEDQ  D:  01/20/2011  T:  01/20/2011  Job:  [id number]

## 2011-08-25 ENCOUNTER — Telehealth: Payer: Self-pay | Admitting: Cardiology

## 2011-08-25 MED ORDER — NITROGLYCERIN 0.4 MG SL SUBL: 0.4000 mg | SUBLINGUAL_TABLET | SUBLINGUAL | Status: DC | PRN

## 2011-08-25 NOTE — Telephone Encounter (Signed)
Spoke with pt. Pt  asking for fresh NTG, she states she is not having any problems. Last OV with Dr Riley Kill 01/2009. I told pt I would refill NTG but would not be able to fill again until OV with Dr Riley Kill. She agreed with making an appt. I transferred her to PCP to schedule appt with Dr Riley Kill.

## 2011-08-25 NOTE — Telephone Encounter (Signed)
New refill Pt wants to get refill of nitroglycerin tablets. She said she last got filled about five years ago. Please call to [facility] and let her know

## 2011-10-18 ENCOUNTER — Ambulatory Visit (INDEPENDENT_AMBULATORY_CARE_PROVIDER_SITE_OTHER): Payer: Medicare Other | Admitting: Cardiology

## 2011-10-18 ENCOUNTER — Encounter: Payer: Self-pay | Admitting: Cardiology

## 2011-10-18 VITALS — BP 162/76 | HR 81 | Ht 61.5 in | Wt 133.0 lb

## 2011-10-18 DIAGNOSIS — I10 Essential (primary) hypertension: Secondary | ICD-10-CM

## 2011-10-18 DIAGNOSIS — F17200 Nicotine dependence, unspecified, uncomplicated: Secondary | ICD-10-CM

## 2011-10-18 DIAGNOSIS — I6529 Occlusion and stenosis of unspecified carotid artery: Secondary | ICD-10-CM

## 2011-10-18 DIAGNOSIS — E785 Hyperlipidemia, unspecified: Secondary | ICD-10-CM

## 2011-10-18 DIAGNOSIS — I2510 Atherosclerotic heart disease of native coronary artery without angina pectoris: Secondary | ICD-10-CM

## 2011-10-18 MED ORDER — METOPROLOL SUCCINATE ER 100 MG PO TB24: 100.0000 mg | ORAL_TABLET | Freq: Every day | ORAL | Status: DC

## 2011-10-18 NOTE — Progress Notes (Signed)
HPI:  The patient returns for a follow up visit.  She sees Dr. Jeannetta Nap and I have not seen her in some time.  It has been a stressful period for her.  Her sister in [place] is in hospice.  She does continue to smoke about a pack of cigarettes per day.  She denies any ongoing chest pain.  Dr. Jeannetta Nap checks per labs regularly according to the patient.   Current Outpatient Prescriptions  Medication Sig Dispense Refill  . aspirin 81 MG EC tablet Take 81 mg by mouth daily.        . Calcium Carbonate-Vitamin D (CALCIUM-D PO) Take by mouth daily.        Marland Kitchen gemfibrozil (LOPID) 600 MG tablet 600 mg 2 (two) times daily.       . Glucosamine-Chondroit-Vit C-Mn (GLUCOSAMINE CHONDR 1500 COMPLX PO) Take 1,500 capsules by mouth daily.      Marland Kitchen LOVASTATIN PO Take 20 mg by mouth daily.       . metoprolol succinate (TOPROL-XL) 100 MG 24 hr tablet Take 100 mg by mouth. Take 1/2 tablet daily      . multivitamin (THERAGRAN) per tablet Take 1 tablet by mouth daily.        Marland Kitchen NIACIN PO Take by mouth daily.        . nitroGLYCERIN (NITROSTAT) 0.4 MG SL tablet Place 1 tablet (0.4 mg total) under the tongue every 5 (five) minutes as needed for chest pain.  25 tablet  0    Allergies  Allergen Reactions  . Dextroamphetamine Sulfate Er     Past Medical History  Diagnosis Date  . Carotid artery occlusion   . Stroke 2003  . Hiatal hernia   . Headache     Past Surgical History  Procedure Date  . Abdominal hysterectomy   . Rotator cuff surgery     left shoulder  . Tonsillectomy   . Bladder surgery     Family History  Problem Relation Age of Onset  . Stroke Mother     History   Social History  . Marital Status: Married    Spouse Name: N/A    Number of Children: N/A  . Years of Education: N/A   Occupational History  . Not on file.   Social History Main Topics  . Smoking status: Current Everyday Smoker    Types: Cigarettes  . Smokeless tobacco: Not on file  . Alcohol Use: No  . Drug Use: No  .  Sexually Active: Not on file   Other Topics Concern  . Not on file   Social History Narrative  . No narrative on file    ROS: Please see the HPI.  All other systems reviewed and negative.  PHYSICAL EXAM:  BP 162/76  Pulse 81  Ht 5' 1.5" (1.562 m)  Wt 133 lb (60.328 kg)  BMI 24.72 kg/m2  General:Thin older woman in no distress.   Head:  Normocephalic and atraumatic. Neck: no JVD.  No L bruit, soft R bruit.   Lungs: Moderate ronchii and some expiratory wheezes.   Heart: Normal S1 and S2.  S4 gallop.  No murmur, rubs or gallops.  Abdomen:  Normal bowel sounds; soft; non tender; no organomegaly Pulses: Pulses normal in all 4 extremities. Extremities: No clubbing or cyanosis. No edema. Neurologic: Alert and oriented x 3.  EKG:  NSR.  LVH with repolarization changes.  Cath 2005    ANGIOGRAPHIC DATA:  1. Ventriculography was performed in the RAO projection. There  was mild  mitral valve prolapse and mild mitral regurgitation. There was  inferobasilar hypo to akinesis. Ejection fraction was calculated at 48%.  2. The left main coronary artery was free of critical disease and had mild  calcification.  3. The left anterior descending coronary artery coursed to the apex. Beyond  the origin of the diagonal there was about 30% narrowing and then a 30%  to 50% area beyond this in a bend point in the vessel. There was slight  hypodensity at this location. There was a major diagonal branch with some  mild proximal irregularity but without high-grade stenosis.  4. There was a ramus intermediate without significant narrowing.  5. The circumflex provides a small marginal branch and then a large  bifurcating marginal branch as well as a posterolateral branch. The  marginal branch has 70% narrowing but is a relatively small vessel.  6. The right coronary artery is large caliber vessel. Just beyond the origin  of the SA nodal artery is a subtotal occlusion of the vessel in the mid  vessel  with TIMI 1 flow. Following balloon dilatation and stenting,  this was reduced to 0% with TIMI 3 flow into the distal vessel. The  distal vessel was without critical narrowing.  CONCLUSION:  1. Moderate reduction in LV function with inferobasal wall motion  abnormality.  2. Subtotal occlusion of the right coronary artery with TIMI 1 flow with  subsequent percutaneous stenting using a non drug-eluding stent because  of the recent history of GI bleed.  3. Scattered abnormalities of the left coronary system as noted above.  DISPOSITION: The patient will be treated with beta blockers, hopefully  aspirin and clopidogrel. A GI consult will be obtained. She will be started  on intravenous Protonix as an 80 mg dose, then an 8 mg per hour drip. This  will eventually be discontinued. Hopefully the patient will stabilize.  Angiomax has been totally discontinued. She will be off Angiomax at the  present time. She will need no other intravenous anticoagulations.  Unfortunately she has been on Plavix continuously and this should provide a  small amount of buffer with regard to antiplatelet therapy in the short  run. Four weeks of antiplatelet therapy should be adequate with regard to  Plavix if there are continued problems with GI bleeding.  Arturo Morton. Riley Kill, M.D.  TDS/MEDQ D: 07/05/2003 T: 07/05/2003 Job: [id number]    ASSESSMENT AND PLAN:

## 2011-10-18 NOTE — Patient Instructions (Addendum)
Your physician has recommended you make the following change in your medication: STOP Niacin, INCREASE Metoprolol Succinate to 100mg  one by mouth daily  Your physician wants you to follow-up in: 4 MONTHS with Dr Riley Kill.  You will receive a reminder letter in the mail two months in advance. If you don't receive a letter, please call our office to schedule the follow-up appointment.

## 2011-10-19 DIAGNOSIS — I10 Essential (primary) hypertension: Secondary | ICD-10-CM | POA: Insufficient documentation

## 2011-10-19 NOTE — Assessment & Plan Note (Signed)
Followed by Dr. Jeannetta Nap.  Of note, I told her that she could stop her niacin---she takes a tiny dose and I reviewed some of the recent niacin data with her.

## 2011-10-19 NOTE — Assessment & Plan Note (Signed)
Unfortunately the patient continues to smoke. She understands that she is at increased risk for MI.  She stopped initially when she had her stroke, which preceeded her MI, but has struggled ever since.  She currently has no symptoms.  Continued medical therapy would be appropriate.

## 2011-10-19 NOTE — Assessment & Plan Note (Signed)
Her BP is elevated, and she says whenever she checks it it seems to be in the 160 range or higher.  Therefore, the findings today are likely indicative of where she remains on a regular basis.  He still has a lot of room with her pulse, so to simplify things seems appropriate to give her a trial of Metoprolol XL 100mg  per day  (She already is on half that amount).  I have encouraged her to follow up with Dr. Jeannetta Nap soon to assess her BP and make appropriate adjustments.  She was notified that the metoprolol could cause some side effects, and these were reviewed.

## 2011-10-19 NOTE — Assessment & Plan Note (Signed)
Has been unsuccesful in her attempts.  Options reviewed.  TS

## 2011-10-19 NOTE — Assessment & Plan Note (Signed)
Has been following with Dr. Darrick Penna on a regular basis.  See data. It appears he will continue to monitor.

## 2012-01-05 ENCOUNTER — Ambulatory Visit: Payer: Medicare Other | Admitting: Neurosurgery

## 2012-01-05 ENCOUNTER — Other Ambulatory Visit: Payer: Medicare Other

## 2012-01-17 ENCOUNTER — Other Ambulatory Visit: Payer: Medicare Other

## 2012-01-17 ENCOUNTER — Ambulatory Visit: Payer: Medicare Other | Admitting: Neurosurgery

## 2012-11-27 ENCOUNTER — Other Ambulatory Visit: Payer: Self-pay | Admitting: *Deleted

## 2012-11-27 DIAGNOSIS — I2510 Atherosclerotic heart disease of native coronary artery without angina pectoris: Secondary | ICD-10-CM

## 2012-11-27 MED ORDER — METOPROLOL SUCCINATE ER 100 MG PO TB24: 100.0000 mg | ORAL_TABLET | Freq: Every day | ORAL | Status: DC

## 2012-11-27 NOTE — Telephone Encounter (Signed)
Metoprolol succinate refill sent

## 2013-02-14 ENCOUNTER — Encounter (HOSPITAL_COMMUNITY): Payer: Self-pay | Admitting: Emergency Medicine

## 2013-02-14 ENCOUNTER — Emergency Department (HOSPITAL_COMMUNITY)
Admission: EM | Admit: 2013-02-14 | Discharge: 2013-02-14 | Discharge status: Against Medical Advice | Payer: Medicare Other | Attending: Emergency Medicine | Admitting: Emergency Medicine

## 2013-02-14 ENCOUNTER — Emergency Department (HOSPITAL_COMMUNITY): Payer: Medicare Other

## 2013-02-14 DIAGNOSIS — F17200 Nicotine dependence, unspecified, uncomplicated: Secondary | ICD-10-CM | POA: Insufficient documentation

## 2013-02-14 DIAGNOSIS — K838 Other specified diseases of biliary tract: Secondary | ICD-10-CM | POA: Insufficient documentation

## 2013-02-14 DIAGNOSIS — Z8679 Personal history of other diseases of the circulatory system: Secondary | ICD-10-CM | POA: Insufficient documentation

## 2013-02-14 DIAGNOSIS — Z79899 Other long term (current) drug therapy: Secondary | ICD-10-CM | POA: Insufficient documentation

## 2013-02-14 DIAGNOSIS — Z8719 Personal history of other diseases of the digestive system: Secondary | ICD-10-CM | POA: Insufficient documentation

## 2013-02-14 DIAGNOSIS — R1013 Epigastric pain: Secondary | ICD-10-CM

## 2013-02-14 DIAGNOSIS — K8020 Calculus of gallbladder without cholecystitis without obstruction: Secondary | ICD-10-CM

## 2013-02-14 DIAGNOSIS — Z8673 Personal history of transient ischemic attack (TIA), and cerebral infarction without residual deficits: Secondary | ICD-10-CM | POA: Insufficient documentation

## 2013-02-14 DIAGNOSIS — Z7982 Long term (current) use of aspirin: Secondary | ICD-10-CM | POA: Insufficient documentation

## 2013-02-14 LAB — CBC WITH DIFFERENTIAL/PLATELET
Basophils Relative: 1 % (ref 0–1)
Eosinophils Absolute: 0.1 10*3/uL (ref 0.0–0.7)
Eosinophils Relative: 2 % (ref 0–5)
HCT: 42.6 % (ref 36.0–46.0)
Hemoglobin: 14.3 g/dL (ref 12.0–15.0)
MCH: 30.2 pg (ref 26.0–34.0)
MCHC: 33.6 g/dL (ref 30.0–36.0)
MCV: 89.9 fL (ref 78.0–100.0)
Monocytes Absolute: 0.5 10*3/uL (ref 0.1–1.0)
Monocytes Relative: 6 % (ref 3–12)

## 2013-02-14 LAB — COMPREHENSIVE METABOLIC PANEL
Albumin: 3.7 g/dL (ref 3.5–5.2)
BUN: 14 mg/dL (ref 6–23)
Creatinine, Ser: 1.02 mg/dL (ref 0.50–1.10)
GFR calc Af Amer: 58 mL/min — ABNORMAL LOW (ref >90)
Glucose, Bld: 118 mg/dL — ABNORMAL HIGH (ref 70–99)
Total Bilirubin: 0.3 mg/dL (ref 0.3–1.2)
Total Protein: 7.2 g/dL (ref 6.0–8.3)

## 2013-02-14 LAB — LIPASE, BLOOD: Lipase: 40 U/L (ref 11–59)

## 2013-02-14 LAB — POCT I-STAT TROPONIN I: Troponin i, poc: 0.01 ng/mL (ref 0.00–0.08)

## 2013-02-14 NOTE — ED Provider Notes (Signed)
Medical screening examination/treatment/procedure(s) were performed by non-physician practitioner and as supervising physician I was immediately available for consultation/collaboration.  Shaden Higley R. Eulalie Speights, MD 02/14/13 2049 

## 2013-02-14 NOTE — ED Provider Notes (Signed)
CSN: [id number]     Arrival date & time 02/14/13  1255 History   First MD Initiated Contact with Patient 02/14/13 1319     Chief Complaint  Patient presents with  . Abdominal Pain   (Consider location/radiation/quality/duration/timing/severity/associated sxs/prior Treatment) Patient is a 79 y.o. female presenting with abdominal pain. The history is provided by the patient and medical records.  Abdominal Pain Associated symptoms: nausea    Pt presents to the ED for epigastric abdominal pain with radiation to right chest, onset this morning.  Pain was described as constat, sharp, associated with some nausea, dry heaving, and diaphoresis.  Pt states sx were concerning but they were identical to her prior MI in 2005.  At the time of prior MI she did have a cardiac catherization with bare metal stent placed in RCA.  Pt was previously followed by Dr. Riley Kill but has not seen another cardiologist since he retired last year.  States she rarely has any episodes of chest pain.  Pt does note she has had problems with her gallbladder in the past, prior instance of choledocholithiasis several years ago.  Pt pain free on arrival following 325mg  ASA en route by EMS.  No recent fevers, sweats, or chills.  Pt is a daily smoker.  Past Medical History  Diagnosis Date  . Carotid artery occlusion   . Stroke 2003  . Hiatal hernia   . NWGNFAOZ(308.6)    Past Surgical History  Procedure Laterality Date  . Abdominal hysterectomy    . Rotator cuff surgery      left shoulder  . Tonsillectomy    . Bladder surgery     Family History  Problem Relation Age of Onset  . Stroke Mother    History  Substance Use Topics  . Smoking status: Current Every Day Smoker    Types: Cigarettes  . Smokeless tobacco: Not on file  . Alcohol Use: No   OB History   Grav Para Term Preterm Abortions TAB SAB Ect Mult Living                 Review of Systems  Gastrointestinal: Positive for nausea and abdominal pain.  All  other systems reviewed and are negative.    Allergies  Dextroamphetamine sulfate er  Home Medications   Current Outpatient Rx  Name  Route  Sig  Dispense  Refill  . aspirin 81 MG EC tablet   Oral   Take 81 mg by mouth daily.           Marland Kitchen atenolol (TENORMIN) 25 MG tablet   Oral   Take 25 mg by mouth daily.         . pravastatin (PRAVACHOL) 10 MG tablet   Oral   Take 10 mg by mouth daily.         Marland Kitchen EXPIRED: nitroGLYCERIN (NITROSTAT) 0.4 MG SL tablet   Sublingual   Place 1 tablet (0.4 mg total) under the tongue every 5 (five) minutes as needed for chest pain.   25 tablet   0    BP 165/60  Pulse 82  Temp(Src) 97.6 F (36.4 C) (Oral)  Ht 5' 1.25" (1.556 m)  Wt 135 lb (61.236 kg)  BMI 25.29 kg/m2  SpO2 100%  Physical Exam  Nursing note and vitals reviewed. Constitutional: She is oriented to person, place, and time. She appears well-developed and well-nourished. No distress.  HENT:  Head: Normocephalic and atraumatic.  Mouth/Throat: Oropharynx is clear and moist.  Eyes: Conjunctivae and EOM  are normal. Pupils are equal, round, and reactive to light.  Neck: Normal range of motion. Neck supple.  Cardiovascular: Normal rate, regular rhythm and normal heart sounds.   Pulmonary/Chest: Effort normal and breath sounds normal. No respiratory distress.  Coarse breath sounds bilaterally  Abdominal: Soft. Bowel sounds are normal. There is no tenderness. There is no rigidity, no guarding and no CVA tenderness.  Musculoskeletal: Normal range of motion. She exhibits no edema.  Neurological: She is alert and oriented to person, place, and time. She has normal strength. She displays no tremor. No cranial nerve deficit or sensory deficit. She displays no seizure activity. Gait normal.  No focal neuro deficits appreciated  Skin: Skin is warm and dry. She is not diaphoretic.  Psychiatric: She has a normal mood and affect.    ED Course  Procedures (including critical care  time)   Date: 02/14/2013  Rate: 77  Rhythm: normal sinus rhythm  QRS Axis: normal  Intervals: normal  ST/T Wave abnormalities: nonspecific ST/T changes  Conduction Disutrbances:none  Narrative Interpretation:   Old EKG Reviewed: changes noted   Labs Review Labs Reviewed  COMPREHENSIVE METABOLIC PANEL - Abnormal; Notable for the following:    Glucose, Bld 118 (*)    AST 48 (*)    Alkaline Phosphatase 122 (*)    GFR calc non Af Amer 50 (*)    GFR calc Af Amer 58 (*)    All other components within normal limits  CBC WITH DIFFERENTIAL  LIPASE, BLOOD  POCT I-STAT TROPONIN I   Imaging Review Dg Chest 2 View  02/14/2013   CLINICAL DATA:  Mid chest pain. History of tobacco abuse.  EXAM: CHEST  2 VIEW  COMPARISON:  One-view chest 07/05/2003.  FINDINGS: The heart size is normal. Emphysematous changes are present. No focal airspace disease is evident. The visualized soft tissues and bony thorax are unremarkable.  IMPRESSION: 1. No acute cardiopulmonary disease. 2. Emphysema.   Electronically Signed   By: Gennette Pac   On: 02/14/2013 14:00   US Abdomen Complete  02/14/2013   CLINICAL DATA:  Right upper quadrant abdominal pain.  EXAM: ULTRASOUND ABDOMEN COMPLETE  COMPARISON:  None.  FINDINGS: Gallbladder  Approximate 1.6 cm shadowing gallstone in the vicinity of the gallbladder neck which did not move with changes in patient position. No other visible gallstones. No gallbladder wall thickening or pericholecystic fluid. Negative sonographic Murphy sign according to the ultrasound technologist.  Common bile duct  Diameter: 11 mm, moderately dilated. Distal duct obscured by duodenum bowel gas. No visible bile duct stones.  Liver  Mild central intrahepatic biliary ductal dilation. Normal size and echotexture without focal parenchymal abnormality. Patent portal vein with hepatopetal flow.  IVC  Patent.  Pancreas  Normal size and echotexture without focal parenchymal abnormality.  Spleen  Normal size  and echotexture without focal parenchymal abnormality.  Right Kidney  Length: Approximately 8.2 cm. Mild diffuse cortical thinning. Normal parenchymal echotexture without focal parenchymal abnormality. No hydronephrosis. No visible shadowing calculi.  Left Kidney  Length: Approximately 8.7 cm. Mild diffuse cortical thinning. Normal parenchymal echotexture without focal parenchymal abnormality. No hydronephrosis. No visible shadowing calculi.  Abdominal aorta  Atherosclerotic with maximum diameter of the distal aorta approximating 2.5 cm.  IMPRESSION: 1. Approximate 1.6 cm gallstone in the gallbladder neck which did not move with changes in patient position, indicating that it may be impacted. No sonographic evidence of acute cholecystitis, however. 2. Dilation of the common bile duct up to approximately 11 mm.  No visible bile duct stones. Mild central intrahepatic biliary ductal dilation. 3. Mild diffuse cortical thinning involving both kidneys, consistent with the patient's age. No focal renal parenchymal abnormalities. 4. Abdominal aortic atherosclerosis without evidence of aneurysm.   Electronically Signed   By: Hulan Saas   On: 02/14/2013 16:45    MDM   1. Cholelithiasis   2. Common bile duct dilation   3. Epigastric abdominal pain     EKG NSR, nonspecific ST/T changes which appear more pronounced when compared with previous.  Trop negative.  CXR with emphysematous changes.  Abd u/s as above-- possible impacted 1.6cm stone in gallbladder neck and dilated CBD at 11mm.  No evidence of acute cholecystitis.  Given pts hx of prior choledocholithiasis and prior MI w/stenting, advised pt to be admitted for further evaluation of pain with serial trops and possible ERCP/MRCP.   Pt agreeable to this.   5:51 PM Spoke with pt again, she has changed her mind and does not want hospital admission.  She is of sound mind, no hx of dementia, capable of making her own medical decisions.  I have addressed the  risks associated with this including worsening symptoms, illness, and potential death.  She acknowledged understanding and still wishes to leave.  She was given GI FU if she would like further work-up on OP basis.  Also strongly encouraged her to FU with cardiology given today's ED visit.  She has agreed to do so.   Given strict return precautions should sx worsen.  Pt signed out AMA.   Garlon Hatchet, PA-C 02/14/13 1906

## 2013-02-14 NOTE — ED Notes (Signed)
Pt to ED via EMS with c/o epigastric pain that radiates to right chest, onset this morning. Pt states symptoms similar to when she had heart attack 12 years ago. Pt reports SOB with coughing. Per EMS, BP-138/87, Pulse-82, SpO-96% on room air, RR-18.

## 2013-02-17 ENCOUNTER — Encounter: Payer: Self-pay | Admitting: Internal Medicine

## 2013-03-14 ENCOUNTER — Encounter: Payer: Self-pay | Admitting: Gastroenterology

## 2013-03-20 ENCOUNTER — Ambulatory Visit (INDEPENDENT_AMBULATORY_CARE_PROVIDER_SITE_OTHER): Payer: Medicare Other | Admitting: Internal Medicine

## 2013-03-20 ENCOUNTER — Other Ambulatory Visit (INDEPENDENT_AMBULATORY_CARE_PROVIDER_SITE_OTHER): Payer: Medicare Other

## 2013-03-20 ENCOUNTER — Encounter: Payer: Self-pay | Admitting: Internal Medicine

## 2013-03-20 VITALS — BP 160/80 | HR 108 | Ht 61.0 in | Wt 133.0 lb

## 2013-03-20 DIAGNOSIS — K8020 Calculus of gallbladder without cholecystitis without obstruction: Secondary | ICD-10-CM

## 2013-03-20 DIAGNOSIS — I700 Atherosclerosis of aorta: Secondary | ICD-10-CM | POA: Insufficient documentation

## 2013-03-20 DIAGNOSIS — K838 Other specified diseases of biliary tract: Secondary | ICD-10-CM | POA: Insufficient documentation

## 2013-03-20 LAB — COMPREHENSIVE METABOLIC PANEL
AST: 20 U/L (ref 0–37)
Albumin: 4 g/dL (ref 3.5–5.2)
Alkaline Phosphatase: 98 U/L (ref 39–117)
BUN: 17 mg/dL (ref 6–23)
CO2: 27 mEq/L (ref 19–32)
Creatinine, Ser: 1.1 mg/dL (ref 0.4–1.2)
GFR: 52.7 mL/min — ABNORMAL LOW (ref >60.00)
Glucose, Bld: 106 mg/dL — ABNORMAL HIGH (ref 70–99)
Sodium: 139 mEq/L (ref 135–145)
Total Bilirubin: 0.7 mg/dL (ref 0.3–1.2)
Total Protein: 7.3 g/dL (ref 6.0–8.3)

## 2013-03-20 NOTE — Assessment & Plan Note (Signed)
She at least has this at this point, wonder she might not even have some chronic cholecystitis. We will refer her to surgery for consideration of surgical therapy with cholecystectomy. MRCP is ordered given the dilated bile duct. I explained that she could need an ERCP with stone extraction depending upon what is seen on the MRCP. Repeat LFTs today as well. She was advised that she developed fever or vomiting or severe pains that would not remit she should go to the emergency room.

## 2013-03-20 NOTE — Assessment & Plan Note (Signed)
MRCP to clear the bile duct prior to possible laparoscopic cholecystectomy is ordered.

## 2013-03-20 NOTE — Progress Notes (Signed)
Subjective:    Patient ID: [name], female    DOB: June 14, 1928, 79 y.o.   MRN: [id number]  HPI The patient is a very pleasant 79 year old married white woman who went to the emergency department in late September because of severe right upper quadrant pain associated with nausea and vomiting along with sweats. This happened in midafternoon presumably after lunch. She said she had similar pains 25 years ago was told it was her gallbladder. Over the intervening time she  not seem to really have much in the way of problems. The attack she had in late September or lasted for about 90 minutes and was gone by the time she got to the emergency room.  She had an ultrasound, see below. There is a gallstone in the neck of the gallbladder and it common duct maximally dilated at 11 mm. Mild elevation of LFTs. See below.  Allergies  Allergen Reactions  . Dextroamphetamine Sulfate Er Other (See Comments)    Euphoric feeling   Outpatient Prescriptions Prior to Visit  Medication Sig Dispense Refill  . aspirin 81 MG EC tablet Take 81 mg by mouth daily.        . Calcium Carbonate-Vitamin D (CALCIUM + D PO) Take 1 tablet by mouth daily.      . Glucosamine-Chondroitin (GLUCOSAMINE CHONDR COMPLEX PO) Take 1 capsule by mouth daily.      Marland Kitchen lovastatin (MEVACOR) 20 MG tablet Take 20 mg by mouth at bedtime.      . metoprolol succinate (TOPROL-XL) 100 MG 24 hr tablet Take 100 mg by mouth daily. Take with or immediately following a meal.      . Multiple Vitamin (MULTIVITAMIN WITH MINERALS) TABS tablet Take 1 tablet by mouth at bedtime.      . nitroGLYCERIN (NITROSTAT) 0.4 MG SL tablet Place 0.4 mg under the tongue every 5 (five) minutes as needed for chest pain.       No facility-administered medications prior to visit.   Past Medical History  Diagnosis Date  . Carotid artery occlusion   . Stroke 2003  . Hiatal hernia   . Headache(784.0)   . Gallstones   . Common bile duct dilation   . Abdominal  aortic atherosclerosis   . HLD (hyperlipidemia)   . CAD (coronary artery disease)   . Myocardial infarction    Past Surgical History  Procedure Laterality Date  . Abdominal hysterectomy    . Rotator cuff repair Left   . Tonsillectomy    . Bladder surgery    . Appendectomy     History   Social History  . Marital Status: Married    Spouse Name: N/A    Number of Children: 3  . Years of Education: N/A   Occupational History  . Retired    Social History Main Topics  . Smoking status: Current Every Day Smoker    Types: Cigarettes  . Smokeless tobacco: Never Used  . Alcohol Use: No  . Drug Use: No   Social History Narrative   The patient is married with 3 adult sons   Retired   4 cups of caffeine daily   Originally from [place]   03/20/2013         Family History  Problem Relation Age of Onset  . Stroke Mother   . Breast cancer Maternal Aunt   . Prostate cancer Maternal Grandfather         Review of Systems This is positive for those things mentiones in the HPI,  also positive for hearing decrease, urinary leakage. All other review of systems are negative.      Objective:   Physical Exam General:  Well-developed, well-nourished and in no acute distress Eyes:  anicteric. ENT:   Mouth and posterior pharynx free of lesions.  Neck:   supple w/o thyromegaly or mass.  Lungs: Clear to auscultation bilaterally. Heart:  S1S2, no rubs, murmurs, gallops. Abdomen:  soft, no hepatosplenomegaly, hernia, or mass and BS+. Mildly tender in the right upper quadrant on repeat exams, no clear Murphy's sign no rebound or guarding  Lymph:  no cervical or supraclavicular adenopathy. Extremities:   no edema Skin   no rash. Neuro:  A&O x 3.  Psych:  appropriate mood and  Affect.   Data Reviewed: IMPRESSION:  Abdominal ultrasound February 14, 2013 - images reviewed 1. Approximate 1.6 cm gallstone in the gallbladder neck which did  not move with changes in patient position,  indicating that it may be  impacted. No sonographic evidence of acute cholecystitis, however.  2. Dilation of the common bile duct up to approximately 11 mm. No  visible bile duct stones. Mild central intrahepatic biliary ductal  dilation.  3. Mild diffuse cortical thinning involving both kidneys, consistent  with the patient's age. No focal renal parenchymal abnormalities.  4. Abdominal aortic atherosclerosis without evidence of aneurysm.   Lab Results  Component Value Date   ALT 23 02/14/2013   AST 48* 02/14/2013   ALKPHOS 122* 02/14/2013   BILITOT 0.3 02/14/2013   Lab Results  Component Value Date   LIPASE 40 02/14/2013   Lab Results  Component Value Date   WBC 7.5 02/14/2013   HGB 14.3 02/14/2013   HCT 42.6 02/14/2013   MCV 89.9 02/14/2013   PLT 177 02/14/2013    ED note of 02/14/2013 reviewed     Assessment & Plan:   1. Symptomatic cholelithiasis   2. Dilated bile duct

## 2013-03-20 NOTE — Patient Instructions (Addendum)
Your physician has requested that you go to the basement for the following lab work before leaving today: CMET  You have been scheduled for an appointment with Dr. Axel Filler at [facility]. Your appointment is on 03/25/13 at 10:45pm. Please arrive at 10:30am for registration. Make certain to bring a list of current medications, including any over the counter medications or vitamins. Also bring your co-pay if you have one as well as your insurance cards. [facility] is located at [address], [address]. Should you need to reschedule your appointment, please contact them at [phone].  We are giving you handouts to read on gallstones and gallbladder removal by laparoscopy.  You have been scheduled for a open MRCP at [facility] on 03/21/13. Your appointment time is 6:45pm. Please arrive at 6:15pm to register. The prep consist of nothing to eat four hours prior to the test. However, if you have any metal in your body, have a pacemaker or defibrillator, please be sure to let your ordering physician know. This test typically takes 45 minutes to 1 hour to complete.  They are located at [address] , phone # [phone].  I appreciate the opportunity to care for you.

## 2013-03-21 ENCOUNTER — Ambulatory Visit
Admission: RE | Admit: 2013-03-21 | Discharge: 2013-03-21 | Disposition: A | Discharge status: Home / Self Care | Payer: Medicare Other | Source: Ambulatory Visit | Attending: Internal Medicine | Admitting: Internal Medicine

## 2013-03-21 DIAGNOSIS — K838 Other specified diseases of biliary tract: Secondary | ICD-10-CM

## 2013-03-21 DIAGNOSIS — K8020 Calculus of gallbladder without cholecystitis without obstruction: Secondary | ICD-10-CM

## 2013-03-25 ENCOUNTER — Telehealth (INDEPENDENT_AMBULATORY_CARE_PROVIDER_SITE_OTHER): Payer: Self-pay | Admitting: *Deleted

## 2013-03-25 ENCOUNTER — Ambulatory Visit (INDEPENDENT_AMBULATORY_CARE_PROVIDER_SITE_OTHER): Payer: Medicare Other | Admitting: General Surgery

## 2013-03-25 ENCOUNTER — Encounter (INDEPENDENT_AMBULATORY_CARE_PROVIDER_SITE_OTHER): Payer: Self-pay | Admitting: General Surgery

## 2013-03-25 VITALS — BP 148/88 | HR 71 | Temp 98.9°F | Resp 16 | Ht 61.0 in | Wt 133.6 lb

## 2013-03-25 DIAGNOSIS — Z01810 Encounter for preprocedural cardiovascular examination: Secondary | ICD-10-CM

## 2013-03-25 DIAGNOSIS — K8020 Calculus of gallbladder without cholecystitis without obstruction: Secondary | ICD-10-CM

## 2013-03-25 NOTE — Telephone Encounter (Signed)
I called and spoke with [name] to inform her of her appt with Dr. Tenny Craw at [facility] on 04/04/13 with an arrival time of 11:15am.  [name] is aware of their location as she has been there before.  [name] agreeable to appt.

## 2013-03-25 NOTE — Progress Notes (Signed)
Patient ID: [name], female   DOB: [birth year], 79 y.o.   MRN: [id number]  No chief complaint on file.   HPI [name] is a 79 y.o. female.  The patient is an 79 year old female who is referred by Dr. Marvell Fuller office for an evaluation of symptomatic gallstones. The patient states she's had a six-week history of a previous gallbladder attack and pain in the right upper quadrant. She states she's had this previously approximately 20 years ago. She underwent ultrasound which revealed a large 16 mm gallstone. The patient was undergo MRCP for further evaluation of a dilated common bile duct. The common bile duct measured 11 mm on ultrasound, and there is no obstructive stone that was seen.  Of note the patient has had a previous stroke in 2003 and heart attack in 2005. Patient was seen Dr. Tedra Senegal cardiology. She is not seen him recently or any other cardiologists. She does still take nitroglycerin.  HPI  Past Medical History  Diagnosis Date  . Carotid artery occlusion   . Stroke 2003  . Hiatal hernia   . Headache(784.0)   . Gallstones   . Common bile duct dilation   . Abdominal aortic atherosclerosis   . HLD (hyperlipidemia)   . CAD (coronary artery disease)   . Myocardial infarction     Past Surgical History  Procedure Laterality Date  . Abdominal hysterectomy    . Rotator cuff repair Left   . Tonsillectomy    . Bladder surgery    . Appendectomy      Family History  Problem Relation Age of Onset  . Stroke Mother   . Breast cancer Maternal Aunt   . Prostate cancer Maternal Grandfather     Social History History  Substance Use Topics  . Smoking status: Current Every Day Smoker    Types: Cigarettes  . Smokeless tobacco: Never Used  . Alcohol Use: No    Allergies  Allergen Reactions  . Dextroamphetamine Sulfate Er Other (See Comments)    Euphoric feeling    Current Outpatient Prescriptions  Medication Sig Dispense Refill  . aspirin 81 MG EC tablet  Take 81 mg by mouth daily.        . Calcium Carbonate-Vitamin D (CALCIUM + D PO) Take 1 tablet by mouth daily.      . Glucosamine-Chondroitin (GLUCOSAMINE CHONDR COMPLEX PO) Take 1 capsule by mouth daily.      Marland Kitchen lovastatin (MEVACOR) 20 MG tablet Take 20 mg by mouth at bedtime.      . metoprolol succinate (TOPROL-XL) 100 MG 24 hr tablet Take 100 mg by mouth daily. Take with or immediately following a meal.      . Multiple Vitamin (MULTIVITAMIN WITH MINERALS) TABS tablet Take 1 tablet by mouth at bedtime.      . nitroGLYCERIN (NITROSTAT) 0.4 MG SL tablet Place 0.4 mg under the tongue every 5 (five) minutes as needed for chest pain.       No current facility-administered medications for this visit.    Review of Systems Review of Systems  Blood pressure 148/88, pulse 71, temperature 98.9 F (37.2 C), temperature source Temporal, resp. rate 16, height 5\' 1"  (1.549 m), weight 133 lb 9.6 oz (60.601 kg).  Physical Exam Physical Exam  Data Reviewed Ultrasound revealed 16 mm gallstone.  LFTs within normal limits    Assessment    79 year old female with a symptomatic gallstone.     Plan    1. Will proceed with cardiac evaluation prior  to setting up her surgery. 2. We'll proceed with a laparoscopic cholecystectomy with IOC after cardiac evaluation. 3. All risks and benefits were discussed with the patient to generally include: infection, bleeding, possible need for post op ERCP, damage to the bile ducts, and bile leak. Alternatives were offered and described.  All questions were answered and the patient voiced understanding of the procedure and wishes to proceed at this point with a laparoscopic cholecystectomy         Marigene Ehlers., [facility] 03/25/2013, 10:57 AM

## 2013-03-25 NOTE — Progress Notes (Signed)
Quick Note:  Labs are ok  She did not have MRCP??  Please contact her ______

## 2013-04-02 ENCOUNTER — Other Ambulatory Visit: Payer: Medicare Other

## 2013-04-04 ENCOUNTER — Encounter: Payer: Self-pay | Admitting: Internal Medicine

## 2013-04-04 ENCOUNTER — Ambulatory Visit (INDEPENDENT_AMBULATORY_CARE_PROVIDER_SITE_OTHER): Payer: Medicare Other | Admitting: Internal Medicine

## 2013-04-04 VITALS — BP 157/87 | HR 81 | Ht 61.0 in | Wt 133.0 lb

## 2013-04-04 DIAGNOSIS — E785 Hyperlipidemia, unspecified: Secondary | ICD-10-CM

## 2013-04-04 DIAGNOSIS — I25810 Atherosclerosis of coronary artery bypass graft(s) without angina pectoris: Secondary | ICD-10-CM

## 2013-04-04 NOTE — Progress Notes (Signed)
HPI:   Patient is an 79 yo with a history of CAD and CV disease  She was previously followed by T Stuckey.   Since seen in clinic she denies CP  Breating is OK.    Current Outpatient Prescriptions  Medication Sig Dispense Refill  . aspirin 81 MG EC tablet Take 81 mg by mouth daily.        . Calcium Carbonate-Vitamin D (CALCIUM + D PO) Take 1 tablet by mouth daily.      . Glucosamine-Chondroitin (GLUCOSAMINE CHONDR COMPLEX PO) Take 1 capsule by mouth daily.      Marland Kitchen lovastatin (MEVACOR) 20 MG tablet Take 20 mg by mouth at bedtime.      . metoprolol succinate (TOPROL-XL) 100 MG 24 hr tablet Take 100 mg by mouth daily. Take with or immediately following a meal.      . Multiple Vitamin (MULTIVITAMIN WITH MINERALS) TABS tablet Take 1 tablet by mouth at bedtime.      . nitroGLYCERIN (NITROSTAT) 0.4 MG SL tablet Place 0.4 mg under the tongue every 5 (five) minutes as needed for chest pain.       No current facility-administered medications for this visit.    Allergies  Allergen Reactions  . Dextroamphetamine Sulfate Er Other (See Comments)    Euphoric feeling    Past Medical History  Diagnosis Date  . Carotid artery occlusion   . Stroke 2003  . Hiatal hernia   . Headache(784.0)   . Gallstones   . Common bile duct dilation   . Abdominal aortic atherosclerosis   . HLD (hyperlipidemia)   . CAD (coronary artery disease)   . Myocardial infarction     Past Surgical History  Procedure Laterality Date  . Abdominal hysterectomy    . Rotator cuff repair Left   . Tonsillectomy    . Bladder surgery    . Appendectomy      Family History  Problem Relation Age of Onset  . Stroke Mother   . Breast cancer Maternal Aunt   . Prostate cancer Maternal Grandfather     History   Social History  . Marital Status: Married    Spouse Name: N/A    Number of Children: 3  . Years of Education: N/A   Occupational History  . Retired    Social History Main Topics  . Smoking status: Current  Every Day Smoker    Types: Cigarettes  . Smokeless tobacco: Never Used  . Alcohol Use: No  . Drug Use: No  . Sexual Activity: Not on file   Other Topics Concern  . Not on file   Social History Narrative   The patient is married with 3 adult sons   Retired   4 cups of caffeine daily   Originally from [place]   03/20/2013          ROS: Please see the HPI.  All other systems reviewed and negative.  PHYSICAL EXAM:  BP 157/87  Pulse 81  Ht 5\' 1"  (1.549 m)  Wt 133 lb (60.328 kg)  BMI 25.14 kg/m2  General:Thin older woman in no distress.   Head:  Normocephalic and atraumatic. Neck: no JVD.  No L bruit, soft R bruit.   Lungs: Moderate ronchii and some expiratory wheezes.   Heart: Normal S1 and S2.  S4 gallop.  No murmur, rubs or gallops.  Abdomen:  Normal bowel sounds; soft; non tender; no organomegaly Pulses: Pulses normal in all 4 extremities. Extremities: No clubbing or cyanosis.  No edema. Neurologic: Alert and oriented  Cath 2005    ANGIOGRAPHIC DATA:  1. Ventriculography was performed in the RAO projection. There was mild  mitral valve prolapse and mild mitral regurgitation. There was  inferobasilar hypo to akinesis. Ejection fraction was calculated at 48%.  2. The left main coronary artery was free of critical disease and had mild  calcification.  3. The left anterior descending coronary artery coursed to the apex. Beyond  the origin of the diagonal there was about 30% narrowing and then a 30%  to 50% area beyond this in a bend point in the vessel. There was slight  hypodensity at this location. There was a major diagonal branch with some  mild proximal irregularity but without high-grade stenosis.  4. There was a ramus intermediate without significant narrowing.  5. The circumflex provides a small marginal branch and then a large  bifurcating marginal branch as well as a posterolateral branch. The  marginal branch has 70% narrowing but is a relatively small  vessel.  6. The right coronary artery is large caliber vessel. Just beyond the origin  of the SA nodal artery is a subtotal occlusion of the vessel in the mid  vessel with TIMI 1 flow. Following balloon dilatation and stenting,  this was reduced to 0% with TIMI 3 flow into the distal vessel. The  distal vessel was without critical narrowing.      ASSESSMENT AND PLAN:  1.  CAD  I am not convinced active ischenmia  She is being evaluate for surgery.  I would recomm An echot to evaluate LV function  From a cardiac standpoint I feel that she is at a rel low risk form major ischemic event  Follow BP, fluids carefuly    .2HTN  BP is a little high  Follow for now  3.  HL  Will need to review  On lovastatin.

## 2013-04-04 NOTE — Patient Instructions (Signed)
Your physician wants you to follow-up in: ONE YEAR WITH DR ROSS You will receive a reminder letter in the mail two months in advance. If you don't receive a letter, please call our office to schedule the follow-up appointment.   Your physician has requested that you have an echocardiogram. Echocardiography is a painless test that uses sound waves to create images of your heart. It provides your doctor with information about the size and shape of your heart and how well your heart's chambers and valves are working. This procedure takes approximately one hour. There are no restrictions for this procedure.   Your physician recommends that you return for lab work FASTING WITH ECHO 

## 2013-04-16 ENCOUNTER — Telehealth (INDEPENDENT_AMBULATORY_CARE_PROVIDER_SITE_OTHER): Payer: Self-pay

## 2013-04-16 ENCOUNTER — Other Ambulatory Visit (INDEPENDENT_AMBULATORY_CARE_PROVIDER_SITE_OTHER): Payer: Medicare Other

## 2013-04-16 ENCOUNTER — Encounter: Payer: Self-pay | Admitting: Cardiology

## 2013-04-16 ENCOUNTER — Ambulatory Visit (HOSPITAL_COMMUNITY): Payer: Medicare Other | Attending: Cardiology | Admitting: Radiology

## 2013-04-16 DIAGNOSIS — Z8673 Personal history of transient ischemic attack (TIA), and cerebral infarction without residual deficits: Secondary | ICD-10-CM | POA: Insufficient documentation

## 2013-04-16 DIAGNOSIS — F17200 Nicotine dependence, unspecified, uncomplicated: Secondary | ICD-10-CM | POA: Insufficient documentation

## 2013-04-16 DIAGNOSIS — I25810 Atherosclerosis of coronary artery bypass graft(s) without angina pectoris: Secondary | ICD-10-CM

## 2013-04-16 DIAGNOSIS — E785 Hyperlipidemia, unspecified: Secondary | ICD-10-CM

## 2013-04-16 DIAGNOSIS — I2510 Atherosclerotic heart disease of native coronary artery without angina pectoris: Secondary | ICD-10-CM | POA: Insufficient documentation

## 2013-04-16 LAB — LIPID PANEL
Cholesterol: 182 mg/dL (ref 0–200)
Triglycerides: 137 mg/dL (ref 0.0–149.0)
VLDL: 27.4 mg/dL (ref 0.0–40.0)

## 2013-04-16 LAB — CBC WITH DIFFERENTIAL/PLATELET
Basophils Relative: 0.7 % (ref 0.0–3.0)
Hemoglobin: 13.5 g/dL (ref 12.0–15.0)
Lymphs Abs: 1.9 10*3/uL (ref 0.7–4.0)
MCHC: 33.8 g/dL (ref 30.0–36.0)
MCV: 90.3 fl (ref 78.0–100.0)
Neutrophils Relative %: 60.1 % (ref 43.0–77.0)
RBC: 4.43 Mil/uL (ref 3.87–5.11)
RDW: 14.2 % (ref 11.5–14.6)

## 2013-04-16 NOTE — Telephone Encounter (Signed)
Called and spoke to patient to make aware that we have rec'd her cardiac clearance from Dr. Tenny Craw and to be expecting a call from our surgery schedulers no later than Monday 04/21/2013.  Patient aware our office will be closed Thanksgiving Day and that following Friday as well.

## 2013-04-16 NOTE — Progress Notes (Signed)
Echocardiogram performed.  

## 2013-04-21 ENCOUNTER — Encounter (HOSPITAL_COMMUNITY): Payer: Self-pay | Admitting: Pharmacy Technician

## 2013-04-23 ENCOUNTER — Other Ambulatory Visit: Payer: Self-pay | Admitting: *Deleted

## 2013-04-23 MED ORDER — LOVASTATIN 40 MG PO TABS: 40.0000 mg | ORAL_TABLET | Freq: Every day | ORAL | Status: DC

## 2013-04-25 ENCOUNTER — Other Ambulatory Visit (INDEPENDENT_AMBULATORY_CARE_PROVIDER_SITE_OTHER): Payer: Self-pay | Admitting: General Surgery

## 2013-05-01 ENCOUNTER — Encounter (HOSPITAL_COMMUNITY)
Admission: RE | Admit: 2013-05-01 | Discharge: 2013-05-01 | Disposition: A | Discharge status: Home / Self Care | Payer: Medicare Other | Source: Ambulatory Visit | Attending: General Surgery | Admitting: General Surgery

## 2013-05-01 ENCOUNTER — Encounter (HOSPITAL_COMMUNITY): Payer: Self-pay

## 2013-05-01 DIAGNOSIS — Z01812 Encounter for preprocedural laboratory examination: Secondary | ICD-10-CM | POA: Insufficient documentation

## 2013-05-01 DIAGNOSIS — Z01818 Encounter for other preprocedural examination: Secondary | ICD-10-CM | POA: Insufficient documentation

## 2013-05-01 HISTORY — DX: Other specified postprocedural states: Z98.890

## 2013-05-01 HISTORY — DX: Other specified postprocedural states: R11.2

## 2013-05-01 LAB — BASIC METABOLIC PANEL
BUN: 20 mg/dL (ref 6–23)
CO2: 27 mEq/L (ref 19–32)
Chloride: 101 mEq/L (ref 96–112)
GFR calc Af Amer: 56 mL/min — ABNORMAL LOW (ref >90)
Potassium: 4.1 mEq/L (ref 3.5–5.1)
Sodium: 139 mEq/L (ref 135–145)

## 2013-05-01 LAB — CBC
HCT: 43.1 % (ref 36.0–46.0)
Hemoglobin: 14.7 g/dL (ref 12.0–15.0)
MCHC: 34.1 g/dL (ref 30.0–36.0)
MCV: 91.9 fL (ref 78.0–100.0)
WBC: 7.4 10*3/uL (ref 4.0–10.5)

## 2013-05-01 MED ORDER — CHLORHEXIDINE GLUCONATE 4 % EX LIQD: 1.0000 "application " | Freq: Once | CUTANEOUS | Status: DC

## 2013-05-01 NOTE — Pre-Procedure Instructions (Addendum)
[name]  05/01/2013   Your procedure is scheduled on:  May 05, 2013 @ 7:30  Report to [facility](Entrance A) at 5:30 AM.  Call this number if you have problems the morning of surgery: [phone]   Remember:  Discontinue aspirin and herbal medications today 12/11   Do not eat food or drink liquids after midnight.   Take these medicines the morning of surgery with A SIP OF WATER: metoprolol succinate (TOPROL-XL)     Do not wear jewelry, make-up or nail polish.  Do not wear lotions, powders, or perfumes. You may wear deodorant.  Do not shave 48 hours prior to surgery. Men may shave face and neck.  Do not bring valuables to the hospital.  [facility] is not responsible  for any belongings or valuables.               Contacts, dentures or bridgework may not be worn into surgery.  Leave suitcase in the car. After surgery it may be brought to your room.  For patients admitted to the hospital, discharge time is determined by your  treatment team.               Patients discharged the day of surgery will not be allowed to drive  home.  Name and phone number of your driver:   Special Instructions: Shower using CHG 2 nights before surgery and the night before surgery.  If you shower the day of surgery use CHG.  Use special wash - you have one bottle of CHG for all showers.  You should use approximately 1/3 of the bottle for each shower.   Please read over the following fact sheets that you were given: Pain Booklet, Coughing and Deep Breathing and Surgical Site Infection Prevention

## 2013-05-04 MED ORDER — CEFAZOLIN SODIUM-DEXTROSE 2-3 GM-% IV SOLR
2.0000 g | INTRAVENOUS | Status: AC
  Administered 2013-05-05: 2 g via INTRAVENOUS
  Filled 2013-05-04: qty 50

## 2013-05-05 ENCOUNTER — Ambulatory Visit (HOSPITAL_COMMUNITY): Payer: Medicare Other | Admitting: Anesthesiology

## 2013-05-05 ENCOUNTER — Ambulatory Visit (HOSPITAL_COMMUNITY)
Admission: RE | Admit: 2013-05-05 | Discharge: 2013-05-05 | Disposition: A | Discharge status: Home / Self Care | Payer: Medicare Other | Source: Ambulatory Visit | Attending: General Surgery | Admitting: General Surgery

## 2013-05-05 ENCOUNTER — Encounter (HOSPITAL_COMMUNITY): Payer: Self-pay | Admitting: *Deleted

## 2013-05-05 ENCOUNTER — Encounter (HOSPITAL_COMMUNITY): Payer: Medicare Other | Admitting: Anesthesiology

## 2013-05-05 ENCOUNTER — Encounter (HOSPITAL_COMMUNITY)
Admission: RE | Disposition: A | Discharge status: Home / Self Care | Payer: Self-pay | Source: Ambulatory Visit | Attending: General Surgery

## 2013-05-05 ENCOUNTER — Ambulatory Visit (HOSPITAL_COMMUNITY): Payer: Medicare Other

## 2013-05-05 DIAGNOSIS — F17200 Nicotine dependence, unspecified, uncomplicated: Secondary | ICD-10-CM | POA: Insufficient documentation

## 2013-05-05 DIAGNOSIS — K449 Diaphragmatic hernia without obstruction or gangrene: Secondary | ICD-10-CM | POA: Insufficient documentation

## 2013-05-05 DIAGNOSIS — K8010 Calculus of gallbladder with chronic cholecystitis without obstruction: Secondary | ICD-10-CM | POA: Insufficient documentation

## 2013-05-05 DIAGNOSIS — I6529 Occlusion and stenosis of unspecified carotid artery: Secondary | ICD-10-CM | POA: Insufficient documentation

## 2013-05-05 DIAGNOSIS — Z8673 Personal history of transient ischemic attack (TIA), and cerebral infarction without residual deficits: Secondary | ICD-10-CM | POA: Insufficient documentation

## 2013-05-05 DIAGNOSIS — Z79899 Other long term (current) drug therapy: Secondary | ICD-10-CM | POA: Insufficient documentation

## 2013-05-05 DIAGNOSIS — I2510 Atherosclerotic heart disease of native coronary artery without angina pectoris: Secondary | ICD-10-CM | POA: Insufficient documentation

## 2013-05-05 DIAGNOSIS — E785 Hyperlipidemia, unspecified: Secondary | ICD-10-CM | POA: Insufficient documentation

## 2013-05-05 DIAGNOSIS — I700 Atherosclerosis of aorta: Secondary | ICD-10-CM | POA: Insufficient documentation

## 2013-05-05 HISTORY — PX: CHOLECYSTECTOMY: SHX55

## 2013-05-05 SURGERY — LAPAROSCOPIC CHOLECYSTECTOMY WITH INTRAOPERATIVE CHOLANGIOGRAM
Anesthesia: General | Site: Abdomen

## 2013-05-05 MED ORDER — ACETAMINOPHEN 650 MG RE SUPP: 650.0000 mg | RECTAL | Status: DC | PRN

## 2013-05-05 MED ORDER — ROCURONIUM BROMIDE 100 MG/10ML IV SOLN
INTRAVENOUS | Status: DC | PRN
  Administered 2013-05-05: 30 mg via INTRAVENOUS

## 2013-05-05 MED ORDER — BUPIVACAINE HCL (PF) 0.25 % IJ SOLN
INTRAMUSCULAR | Status: AC
  Filled 2013-05-05: qty 30

## 2013-05-05 MED ORDER — NEOSTIGMINE METHYLSULFATE 1 MG/ML IJ SOLN
INTRAMUSCULAR | Status: DC | PRN
  Administered 2013-05-05: 3 mg via INTRAVENOUS

## 2013-05-05 MED ORDER — LIDOCAINE HCL (CARDIAC) 20 MG/ML IV SOLN
INTRAVENOUS | Status: DC | PRN
  Administered 2013-05-05: 40 mg via INTRAVENOUS

## 2013-05-05 MED ORDER — ACETAMINOPHEN 325 MG PO TABS: 650.0000 mg | ORAL_TABLET | ORAL | Status: DC | PRN

## 2013-05-05 MED ORDER — LIDOCAINE HCL (CARDIAC) 20 MG/ML IV SOLN: INTRAVENOUS | Status: DC | PRN

## 2013-05-05 MED ORDER — SODIUM CHLORIDE 0.9 % IV SOLN: 250.0000 mL | INTRAVENOUS | Status: DC | PRN

## 2013-05-05 MED ORDER — LACTATED RINGERS IV SOLN
INTRAVENOUS | Status: DC | PRN
  Administered 2013-05-05 (×2): via INTRAVENOUS

## 2013-05-05 MED ORDER — SODIUM CHLORIDE 0.9 % IJ SOLN: 3.0000 mL | INTRAMUSCULAR | Status: DC | PRN

## 2013-05-05 MED ORDER — EPHEDRINE SULFATE 50 MG/ML IJ SOLN
INTRAMUSCULAR | Status: DC | PRN
  Administered 2013-05-05 (×2): 10 mg via INTRAVENOUS

## 2013-05-05 MED ORDER — HYDROMORPHONE HCL PF 1 MG/ML IJ SOLN
INTRAMUSCULAR | Status: AC
  Filled 2013-05-05: qty 1

## 2013-05-05 MED ORDER — SODIUM CHLORIDE 0.9 % IR SOLN
Status: DC | PRN
  Administered 2013-05-05: 1000 mL

## 2013-05-05 MED ORDER — 0.9 % SODIUM CHLORIDE (POUR BTL) OPTIME
TOPICAL | Status: DC | PRN
  Administered 2013-05-05: 1000 mL

## 2013-05-05 MED ORDER — SODIUM CHLORIDE 0.9 % IJ SOLN: 3.0000 mL | Freq: Two times a day (BID) | INTRAMUSCULAR | Status: DC

## 2013-05-05 MED ORDER — ONDANSETRON HCL 4 MG/2ML IJ SOLN
INTRAMUSCULAR | Status: DC | PRN
  Administered 2013-05-05: 4 mg via INTRAVENOUS

## 2013-05-05 MED ORDER — LIDOCAINE HCL 4 % MT SOLN
OROMUCOSAL | Status: DC | PRN
  Administered 2013-05-05: 4 mL via TOPICAL

## 2013-05-05 MED ORDER — IOHEXOL 300 MG/ML  SOLN
INTRAMUSCULAR | Status: DC | PRN
  Administered 2013-05-05: 9 mL via INTRAVENOUS

## 2013-05-05 MED ORDER — OXYCODONE-ACETAMINOPHEN 5-325 MG PO TABS: 1.0000 | ORAL_TABLET | ORAL | Status: DC | PRN

## 2013-05-05 MED ORDER — FENTANYL CITRATE 0.05 MG/ML IJ SOLN
INTRAMUSCULAR | Status: DC | PRN
  Administered 2013-05-05 (×4): 50 ug via INTRAVENOUS

## 2013-05-05 MED ORDER — BUPIVACAINE HCL 0.25 % IJ SOLN
INTRAMUSCULAR | Status: DC | PRN
  Administered 2013-05-05: 7 mL

## 2013-05-05 MED ORDER — ONDANSETRON HCL 4 MG/2ML IJ SOLN: 4.0000 mg | Freq: Four times a day (QID) | INTRAMUSCULAR | Status: DC | PRN

## 2013-05-05 MED ORDER — OXYCODONE HCL 5 MG PO TABS: 5.0000 mg | ORAL_TABLET | ORAL | Status: DC | PRN

## 2013-05-05 MED ORDER — HYDROMORPHONE HCL PF 1 MG/ML IJ SOLN
0.2500 mg | INTRAMUSCULAR | Status: DC | PRN
  Administered 2013-05-05: 0.25 mg via INTRAVENOUS
  Administered 2013-05-05: 0.5 mg via INTRAVENOUS
  Administered 2013-05-05: 0.25 mg via INTRAVENOUS

## 2013-05-05 MED ORDER — ARTIFICIAL TEARS OP OINT
TOPICAL_OINTMENT | OPHTHALMIC | Status: DC | PRN
  Administered 2013-05-05: 1 via OPHTHALMIC

## 2013-05-05 MED ORDER — GLYCOPYRROLATE 0.2 MG/ML IJ SOLN
INTRAMUSCULAR | Status: DC | PRN
  Administered 2013-05-05: .4 mg via INTRAVENOUS

## 2013-05-05 MED ORDER — PROPOFOL 10 MG/ML IV BOLUS
INTRAVENOUS | Status: DC | PRN
  Administered 2013-05-05: 100 mg via INTRAVENOUS

## 2013-05-05 MED ORDER — ONDANSETRON HCL 4 MG/2ML IJ SOLN: 4.0000 mg | Freq: Once | INTRAMUSCULAR | Status: DC | PRN

## 2013-05-05 MED ORDER — ROCURONIUM BROMIDE 100 MG/10ML IV SOLN: INTRAVENOUS | Status: DC | PRN

## 2013-05-05 SURGICAL SUPPLY — 47 items
APL SKNCLS STERI-STRIP NONHPOA (GAUZE/BANDAGES/DRESSINGS) ×1
APPLIER CLIP 5 13 M/L LIGAMAX5 (MISCELLANEOUS)
APR CLP MED LRG 5 ANG JAW (MISCELLANEOUS)
BAG SPEC RTRVL LRG 6X4 10 (ENDOMECHANICALS)
BENZOIN TINCTURE PRP APPL 2/3 (GAUZE/BANDAGES/DRESSINGS) ×2 IMPLANT
CANISTER SUCTION 2500CC (MISCELLANEOUS) ×2 IMPLANT
CHLORAPREP W/TINT 26ML (MISCELLANEOUS) ×2 IMPLANT
CLIP APPLIE 5 13 M/L LIGAMAX5 (MISCELLANEOUS) IMPLANT
CLIP LIGATING HEMO O LOK GREEN (MISCELLANEOUS) ×2 IMPLANT
COVER MAYO STAND STRL (DRAPES) ×2 IMPLANT
COVER SURGICAL LIGHT HANDLE (MISCELLANEOUS) ×2 IMPLANT
COVER TRANSDUCER ULTRASND (DRAPES) ×1 IMPLANT
DEVICE TROCAR PUNCTURE CLOSURE (ENDOMECHANICALS) ×2 IMPLANT
DRAPE C-ARM 42X72 X-RAY (DRAPES) ×2 IMPLANT
DRAPE UTILITY 15X26 W/TAPE STR (DRAPE) ×4 IMPLANT
ELECT REM PT RETURN 9FT ADLT (ELECTROSURGICAL) ×2
ELECTRODE REM PT RTRN 9FT ADLT (ELECTROSURGICAL) ×1 IMPLANT
GAUZE SPONGE 2X2 8PLY STRL LF (GAUZE/BANDAGES/DRESSINGS) ×1 IMPLANT
GLOVE BIO SURGEON STRL SZ7 (GLOVE) ×1 IMPLANT
GLOVE BIO SURGEON STRL SZ7.5 (GLOVE) ×2 IMPLANT
GLOVE BIOGEL PI IND STRL 7.0 (GLOVE) IMPLANT
GLOVE BIOGEL PI INDICATOR 7.0 (GLOVE) ×4
GLOVE SURG SS PI 7.0 STRL IVOR (GLOVE) ×2 IMPLANT
GOWN STRL NON-REIN LRG LVL3 (GOWN DISPOSABLE) ×5 IMPLANT
GOWN STRL REIN XL XLG (GOWN DISPOSABLE) ×2 IMPLANT
IV CATH 14GX2 1/4 (CATHETERS) ×2 IMPLANT
KIT BASIN OR (CUSTOM PROCEDURE TRAY) ×2 IMPLANT
KIT ROOM TURNOVER OR (KITS) ×2 IMPLANT
NDL INSUFFLATION 14GA 120MM (NEEDLE) ×1 IMPLANT
NEEDLE INSUFFLATION 14GA 120MM (NEEDLE) ×2 IMPLANT
NS IRRIG 1000ML POUR BTL (IV SOLUTION) ×2 IMPLANT
PAD ARMBOARD 7.5X6 YLW CONV (MISCELLANEOUS) ×4 IMPLANT
POUCH SPECIMEN RETRIEVAL 10MM (ENDOMECHANICALS) IMPLANT
SCISSORS LAP 5X35 DISP (ENDOMECHANICALS) ×2 IMPLANT
SET CHOLANGIOGRAPHY FRANKLIN (SET/KITS/TRAYS/PACK) ×2 IMPLANT
SET IRRIG TUBING LAPAROSCOPIC (IRRIGATION / IRRIGATOR) ×2 IMPLANT
SLEEVE ENDOPATH XCEL 5M (ENDOMECHANICALS) ×2 IMPLANT
SPECIMEN JAR SMALL (MISCELLANEOUS) ×2 IMPLANT
SPONGE GAUZE 2X2 STER 10/PKG (GAUZE/BANDAGES/DRESSINGS) ×1
STRIP CLOSURE SKIN 1/2X4 (GAUZE/BANDAGES/DRESSINGS) ×1 IMPLANT
SUT MNCRL AB 3-0 PS2 18 (SUTURE) ×2 IMPLANT
TAPE CLOTH SURG 4X10 WHT LF (GAUZE/BANDAGES/DRESSINGS) ×1 IMPLANT
TOWEL OR 17X24 6PK STRL BLUE (TOWEL DISPOSABLE) ×2 IMPLANT
TOWEL OR 17X26 10 PK STRL BLUE (TOWEL DISPOSABLE) ×2 IMPLANT
TRAY LAPAROSCOPIC (CUSTOM PROCEDURE TRAY) ×2 IMPLANT
TROCAR XCEL NON-BLD 11X100MML (ENDOMECHANICALS) ×2 IMPLANT
TROCAR XCEL NON-BLD 5MMX100MML (ENDOMECHANICALS) ×2 IMPLANT

## 2013-05-05 NOTE — Anesthesia Procedure Notes (Signed)
Procedure Name: Intubation Date/Time: 05/05/2013 7:43 AM Performed by: Sherie Don Pre-anesthesia Checklist: Patient identified, Emergency Drugs available, Suction available, Patient being monitored and Timeout performed Patient Re-evaluated:Patient Re-evaluated prior to inductionOxygen Delivery Method: Circle system utilized Preoxygenation: Pre-oxygenation with 100% oxygen Intubation Type: IV induction Laryngoscope Size: Mac and 3 Grade View: Grade I Tube type: Oral Tube size: 7.0 mm Number of attempts: 1 Airway Equipment and Method: Stylet and LTA kit utilized Placement Confirmation: ETT inserted through vocal cords under direct vision and breath sounds checked- equal and bilateral Secured at: 22 cm Tube secured with: Tape Dental Injury: Teeth and Oropharynx as per pre-operative assessment

## 2013-05-05 NOTE — Op Note (Signed)
05/05/2013  8:36 AM  PATIENT:  [name]  79 y.o. female  PRE-OPERATIVE DIAGNOSIS:  GALLSTONES   POST-OPERATIVE DIAGNOSIS:  GALLSTONES   PROCEDURE:  Procedure(s): LAPAROSCOPIC CHOLECYSTECTOMY WITH INTRAOPERATIVE CHOLANGIOGRAM (N/A)  SURGEON:  Surgeon(s) and Role:    * Axel Filler, MD - Primary   ASSISTANTS: none   ANESTHESIA:   general  EBL:  Total I/O In: 1000 [I.V.:1000] Out: -   BLOOD ADMINISTERED:none  DRAINS: none   LOCAL MEDICATIONS USED:  MARCAINE     SPECIMEN:  Source of Specimen:  gallbladder  DISPOSITION OF SPECIMEN:  PATHOLOGY  COUNTS:  YES  TOURNIQUET:  * No tourniquets in log *  DICTATION: .Dragon Dictation Findings:one large gallstone within the gallbladder and normal IOC  Indications for procedure: Pt is a 79 y/o F  with RUQ pain and seen to have gallstones.   She was counseled in clinic and decided to have this electively repaired.  Details of the procedure: The patient was taken to the operating and placed in the supine position with bilateral SCDs in place. A time out was called and all facts were verified. A pneumoperitoneum was obtained via A Veress needle technique to a pressure of 14mm of mercury. A 5mm trochar was then placed in the right upper quadrant under visualization, and there were no injuries to any abdominal organs. A 11 mm port was then placed in the umbilical region after infiltrating with local anesthesia under direct visualization. A second epigastric port was placed under direct visualization. The gallbladder was identified and retracted, the peritoneum was then sharply dissected from the gallbladder and this dissection was carried down to Calot's triangle. The cystic duct was identified and stripped away circumferentially and seen going into the gallbladder 360, the critical angle was obtained. It was noted to be very dilated and large. A Cook catheter was used to perform an intraoperative cholangiogram. The cystic duct  and common bile duct were seen free of filling defects. 2 clips were placed proximally one distally and the cystic duct transected. The cystic artery was identified and 2 clips placed proximally and one distally and transected. We then proceeded to remove the gallbladder off the hepatic fossa with Bovie cautery. A retrieval bag was then placed in the abdomen and gallbladder placed in the bag. The hepatic fossa was then reexamined and hemostasis was achieved with Bovie cautery and was excellent at this portion of the case. The subhepatic fossa and perihepatic fossa was then irrigated until the effluent was clear. The 11 mm trocar fascia was reapproximated with the Endo Close #1 Vicryl x2. The pneumoperitoneum was evacuated and all trochars removed under direct visulalization. The skin was then closed with 4-0 Monocryl and the skin dressed with Steri-Strips, gauze, and tape. The patient was awaken from general anesthesia and taken to the recovery room in stable condition.    PLAN OF CARE: Discharge to home after PACU  PATIENT DISPOSITION:  PACU - hemodynamically stable.   Delay start of Pharmacological VTE agent (>24hrs) due to surgical blood loss or risk of bleeding: no

## 2013-05-05 NOTE — H&P (Signed)
HPI  [name] is a 79 y.o. female. The patient is an 79 year old female who is referred by Dr. Marvell Fuller office for an evaluation of symptomatic gallstones. The patient states she's had a six-week history of a previous gallbladder attack and pain in the right upper quadrant. She states she's had this previously approximately 20 years ago. She underwent ultrasound which revealed a large 16 mm gallstone. The patient was undergo MRCP for further evaluation of a dilated common bile duct. The common bile duct measured 11 mm on ultrasound, and there is no obstructive stone that was seen.  Of note the patient has had a previous stroke in 2003 and heart attack in 2005. Patient was seen Dr. Tedra Senegal cardiology. She is not seen him recently or any other cardiologists. She does still take nitroglycerin.  HPI  Past Medical History   Diagnosis  Date   .  Carotid artery occlusion    .  Stroke  2003   .  Hiatal hernia    .  Headache(784.0)    .  Gallstones    .  Common bile duct dilation    .  Abdominal aortic atherosclerosis    .  HLD (hyperlipidemia)    .  CAD (coronary artery disease)    .  Myocardial infarction     Past Surgical History   Procedure  Laterality  Date   .  Abdominal hysterectomy     .  Rotator cuff repair  Left    .  Tonsillectomy     .  Bladder surgery     .  Appendectomy      Family History   Problem  Relation  Age of Onset   .  Stroke  Mother    .  Breast cancer  Maternal Aunt    .  Prostate cancer  Maternal Grandfather    Social History  History   Substance Use Topics   .  Smoking status:  Current Every Day Smoker     Types:  Cigarettes   .  Smokeless tobacco:  Never Used   .  Alcohol Use:  No    Allergies   Allergen  Reactions   .  Dextroamphetamine Sulfate Er  Other (See Comments)     Euphoric feeling    Current Outpatient Prescriptions   Medication  Sig  Dispense  Refill   .  aspirin 81 MG EC tablet  Take 81 mg by mouth daily.     .  Calcium  Carbonate-Vitamin D (CALCIUM + D PO)  Take 1 tablet by mouth daily.     .  Glucosamine-Chondroitin (GLUCOSAMINE CHONDR COMPLEX PO)  Take 1 capsule by mouth daily.     Marland Kitchen  lovastatin (MEVACOR) 20 MG tablet  Take 20 mg by mouth at bedtime.     .  metoprolol succinate (TOPROL-XL) 100 MG 24 hr tablet  Take 100 mg by mouth daily. Take with or immediately following a meal.     .  Multiple Vitamin (MULTIVITAMIN WITH MINERALS) TABS tablet  Take 1 tablet by mouth at bedtime.     .  nitroGLYCERIN (NITROSTAT) 0.4 MG SL tablet  Place 0.4 mg under the tongue every 5 (five) minutes as needed for chest pain.      No current facility-administered medications for this visit.   Review of Systems  Review of Systems  Blood pressure 148/88, pulse 71, temperature 98.9 F (37.2 C), temperature source Temporal, resp. rate 16, height 5\' 1"  (1.549 m),  weight 133 lb 9.6 oz (60.601 kg).  Physical Exam  Physical Exam  Data Reviewed  Ultrasound revealed 16 mm gallstone.  LFTs within normal limits  Assessment  79 year old female with a symptomatic gallstone.  Plan  1. Will proceed with cardiac evaluation prior to setting up her surgery.  2. We'll proceed with a laparoscopic cholecystectomy with IOC after cardiac evaluation.  3. All risks and benefits were discussed with the patient to generally include: infection, bleeding, possible need for post op ERCP, damage to the bile ducts, and bile leak. Alternatives were offered and described. All questions were answered and the patient voiced understanding of the procedure and wishes to proceed at this point with a laparoscopic cholecystectomy

## 2013-05-05 NOTE — Anesthesia Preprocedure Evaluation (Signed)
Anesthesia Evaluation  Patient identified by MRN, date of birth, ID band Patient awake    Reviewed: Allergy & Precautions, H&P , NPO status , Patient's Chart, lab work & pertinent test results  Airway       Dental   Pulmonary Current Smoker,          Cardiovascular hypertension, + CAD, + Past MI and + Peripheral Vascular Disease     Neuro/Psych  Headaches, Anxiety CVA    GI/Hepatic   Endo/Other    Renal/GU      Musculoskeletal   Abdominal   Peds  Hematology   Anesthesia Other Findings   Reproductive/Obstetrics                           Anesthesia Physical Anesthesia Plan  ASA: III  Anesthesia Plan: General   Post-op Pain Management:    Induction: Intravenous  Airway Management Planned: Oral ETT  Additional Equipment:   Intra-op Plan:   Post-operative Plan: Extubation in OR  Informed Consent: I have reviewed the patients History and Physical, chart, labs and discussed the procedure including the risks, benefits and alternatives for the proposed anesthesia with the patient or authorized representative who has indicated his/her understanding and acceptance.     Plan Discussed with:   Anesthesia Plan Comments:         Anesthesia Quick Evaluation

## 2013-05-05 NOTE — Preoperative (Signed)
Beta Blockers   Reason not to administer Beta Blockers:Toprol XL 05/05/13

## 2013-05-05 NOTE — Transfer of Care (Signed)
Immediate Anesthesia Transfer of Care Note  Patient: [name]  Procedure(s) Performed: Procedure(s): LAPAROSCOPIC CHOLECYSTECTOMY WITH INTRAOPERATIVE CHOLANGIOGRAM (N/A)  Patient Location: PACU  Anesthesia Type:General  Level of Consciousness: patient cooperative  Airway & Oxygen Therapy: Patient Spontanous Breathing and Patient connected to face mask oxygen  Post-op Assessment: Report given to PACU RN and Post -op Vital signs reviewed and stable  Post vital signs: Reviewed and stable  Complications: No apparent anesthesia complications

## 2013-05-05 NOTE — Anesthesia Postprocedure Evaluation (Signed)
  Anesthesia Post-op Note  Patient: [name]  Procedure(s) Performed: Procedure(s): LAPAROSCOPIC CHOLECYSTECTOMY WITH INTRAOPERATIVE CHOLANGIOGRAM (N/A)  Patient Location: PACU  Anesthesia Type:General  Level of Consciousness: awake, alert , oriented and patient cooperative  Airway and Oxygen Therapy: Patient Spontanous Breathing  Post-op Pain: mild  Post-op Assessment: Post-op Vital signs reviewed, Patient's Cardiovascular Status Stable, Respiratory Function Stable, Patent Airway, No signs of Nausea or vomiting and Pain level controlled  Post-op Vital Signs: stable  Complications: No apparent anesthesia complications

## 2013-05-06 ENCOUNTER — Encounter (HOSPITAL_COMMUNITY): Payer: Self-pay | Admitting: General Surgery

## 2013-05-07 ENCOUNTER — Telehealth (INDEPENDENT_AMBULATORY_CARE_PROVIDER_SITE_OTHER): Payer: Self-pay

## 2013-05-07 NOTE — Telephone Encounter (Signed)
The pt called to see when she can shower and remove her bandages.  She had a lap chole on 05/05/2013 by Dr Derrell Lolling.  I read her operative note and told her she should have tape over gauze and steri strips.  I asked her to remove the outer tape and gauze and shower today.  Gently wash with soap and water and let the water rinse over.  Gently pat the incisions dry.  She would like to be called with her postop appointment.

## 2013-05-12 ENCOUNTER — Telehealth (INDEPENDENT_AMBULATORY_CARE_PROVIDER_SITE_OTHER): Payer: Self-pay

## 2013-05-12 NOTE — Telephone Encounter (Signed)
Called patient several times and unable to speak with patient.  Each call someone picked the phone up and would not say anything then they would hang up.  Calling to answer questions patient has and to give post op appointment that's scheduled for 05/30/13 @ 4:15 pm w/Dr. Derrell Lolling

## 2013-05-12 NOTE — Telephone Encounter (Signed)
Message copied by Maryan Puls on Mon May 12, 2013 11:21 AM ------      Message from: Louie Casa      Created: Tue May 06, 2013 11:18 AM      Regarding: Dr. Ramirez/ 1st po appt/ Question      Contact: [phone]       Patient had Gallbladder surgery on 05/05/13 and has a question about the instructions received in the hospital after surgery and needs 2-3 weeks appointment.            Please could you schedule appointment and call her.            Thank you. ------

## 2013-05-30 ENCOUNTER — Ambulatory Visit (INDEPENDENT_AMBULATORY_CARE_PROVIDER_SITE_OTHER): Payer: Medicare Other | Admitting: General Surgery

## 2013-05-30 ENCOUNTER — Encounter (INDEPENDENT_AMBULATORY_CARE_PROVIDER_SITE_OTHER): Payer: Medicare Other | Admitting: General Surgery

## 2013-05-30 ENCOUNTER — Encounter (INDEPENDENT_AMBULATORY_CARE_PROVIDER_SITE_OTHER): Payer: Self-pay | Admitting: General Surgery

## 2013-05-30 VITALS — BP 160/100 | HR 72 | Temp 97.7°F | Resp 16 | Ht 61.0 in | Wt 132.6 lb

## 2013-05-30 DIAGNOSIS — Z9889 Other specified postprocedural states: Secondary | ICD-10-CM

## 2013-05-30 NOTE — Progress Notes (Signed)
Patient ID: [name], female   DOB: 07-27-1928, 80 y.o.   MRN: [id number] Post op course Patient is an 80 year old female status post laparoscopic cholecystectomy. Patient has been doing well postoperatively and no longer has a right upper quadrant pain. Patient tolerated a diet and having normal bowel function.  On Exam: Wounds clean dry and intact  Pathology:   Chronic cholecystitis and gallstone.  This was discussed with the patient.  Assessment and Plan A 80 year old female status post laparoscopic cholecystectomy 1. Patient can return to normal activity levels 2. Patient to return as needed   Ralene Ok, MD [facility] General & Minimally Invasive Surgery Trauma & Emergency Surgery

## 2013-06-09 ENCOUNTER — Encounter (INDEPENDENT_AMBULATORY_CARE_PROVIDER_SITE_OTHER): Payer: Medicare Other | Admitting: General Surgery

## 2014-01-09 ENCOUNTER — Emergency Department (HOSPITAL_COMMUNITY): Payer: Medicare Other

## 2014-01-09 ENCOUNTER — Encounter (HOSPITAL_COMMUNITY): Payer: Self-pay | Admitting: Emergency Medicine

## 2014-01-09 ENCOUNTER — Inpatient Hospital Stay (HOSPITAL_COMMUNITY)
Admission: EM | Admit: 2014-01-09 | Discharge: 2014-01-13 | DRG: 470 | Disposition: A | Discharge status: Home Health | Payer: Medicare Other | Attending: Internal Medicine | Admitting: Internal Medicine

## 2014-01-09 DIAGNOSIS — Z7982 Long term (current) use of aspirin: Secondary | ICD-10-CM

## 2014-01-09 DIAGNOSIS — J449 Chronic obstructive pulmonary disease, unspecified: Secondary | ICD-10-CM | POA: Diagnosis present

## 2014-01-09 DIAGNOSIS — I700 Atherosclerosis of aorta: Secondary | ICD-10-CM

## 2014-01-09 DIAGNOSIS — I6529 Occlusion and stenosis of unspecified carotid artery: Secondary | ICD-10-CM | POA: Diagnosis present

## 2014-01-09 DIAGNOSIS — S72001A Fracture of unspecified part of neck of right femur, initial encounter for closed fracture: Secondary | ICD-10-CM | POA: Diagnosis present

## 2014-01-09 DIAGNOSIS — I2510 Atherosclerotic heart disease of native coronary artery without angina pectoris: Secondary | ICD-10-CM | POA: Diagnosis present

## 2014-01-09 DIAGNOSIS — F17200 Nicotine dependence, unspecified, uncomplicated: Secondary | ICD-10-CM

## 2014-01-09 DIAGNOSIS — J4489 Other specified chronic obstructive pulmonary disease: Secondary | ICD-10-CM | POA: Diagnosis not present

## 2014-01-09 DIAGNOSIS — I6350 Cerebral infarction due to unspecified occlusion or stenosis of unspecified cerebral artery: Secondary | ICD-10-CM | POA: Diagnosis present

## 2014-01-09 DIAGNOSIS — Z8673 Personal history of transient ischemic attack (TIA), and cerebral infarction without residual deficits: Secondary | ICD-10-CM

## 2014-01-09 DIAGNOSIS — Z720 Tobacco use: Secondary | ICD-10-CM | POA: Diagnosis present

## 2014-01-09 DIAGNOSIS — I10 Essential (primary) hypertension: Secondary | ICD-10-CM | POA: Diagnosis present

## 2014-01-09 DIAGNOSIS — Z823 Family history of stroke: Secondary | ICD-10-CM

## 2014-01-09 DIAGNOSIS — K449 Diaphragmatic hernia without obstruction or gangrene: Secondary | ICD-10-CM | POA: Diagnosis present

## 2014-01-09 DIAGNOSIS — E871 Hypo-osmolality and hyponatremia: Secondary | ICD-10-CM | POA: Diagnosis present

## 2014-01-09 DIAGNOSIS — Z79899 Other long term (current) drug therapy: Secondary | ICD-10-CM

## 2014-01-09 DIAGNOSIS — F411 Generalized anxiety disorder: Secondary | ICD-10-CM

## 2014-01-09 DIAGNOSIS — I252 Old myocardial infarction: Secondary | ICD-10-CM | POA: Diagnosis not present

## 2014-01-09 DIAGNOSIS — W010XXA Fall on same level from slipping, tripping and stumbling without subsequent striking against object, initial encounter: Secondary | ICD-10-CM | POA: Diagnosis present

## 2014-01-09 DIAGNOSIS — S72033A Displaced midcervical fracture of unspecified femur, initial encounter for closed fracture: Principal | ICD-10-CM | POA: Diagnosis present

## 2014-01-09 DIAGNOSIS — Y92009 Unspecified place in unspecified non-institutional (private) residence as the place of occurrence of the external cause: Secondary | ICD-10-CM | POA: Diagnosis not present

## 2014-01-09 DIAGNOSIS — E785 Hyperlipidemia, unspecified: Secondary | ICD-10-CM | POA: Diagnosis present

## 2014-01-09 DIAGNOSIS — S72009A Fracture of unspecified part of neck of unspecified femur, initial encounter for closed fracture: Secondary | ICD-10-CM | POA: Diagnosis present

## 2014-01-09 DIAGNOSIS — K838 Other specified diseases of biliary tract: Secondary | ICD-10-CM

## 2014-01-09 DIAGNOSIS — M25559 Pain in unspecified hip: Secondary | ICD-10-CM | POA: Diagnosis present

## 2014-01-09 DIAGNOSIS — S72141A Displaced intertrochanteric fracture of right femur, initial encounter for closed fracture: Secondary | ICD-10-CM

## 2014-01-09 LAB — BASIC METABOLIC PANEL
Anion gap: 16 — ABNORMAL HIGH (ref 5–15)
BUN: 22 mg/dL (ref 6–23)
CHLORIDE: 93 meq/L — AB (ref 96–112)
CO2: 20 meq/L (ref 19–32)
CREATININE: 0.94 mg/dL (ref 0.50–1.10)
Calcium: 9.3 mg/dL (ref 8.4–10.5)
GFR calc Af Amer: 63 mL/min — ABNORMAL LOW (ref >90)
GFR calc non Af Amer: 55 mL/min — ABNORMAL LOW (ref >90)
GLUCOSE: 133 mg/dL — AB (ref 70–99)
POTASSIUM: 5 meq/L (ref 3.7–5.3)
Sodium: 129 mEq/L — ABNORMAL LOW (ref 137–147)

## 2014-01-09 LAB — URINALYSIS, ROUTINE W REFLEX MICROSCOPIC
BILIRUBIN URINE: NEGATIVE
Glucose, UA: NEGATIVE mg/dL
Ketones, ur: NEGATIVE mg/dL
Leukocytes, UA: NEGATIVE
Nitrite: NEGATIVE
PROTEIN: 100 mg/dL — AB
Specific Gravity, Urine: 1.012 (ref 1.005–1.030)
UROBILINOGEN UA: 0.2 mg/dL (ref 0.0–1.0)
pH: 6.5 (ref 5.0–8.0)

## 2014-01-09 LAB — TYPE AND SCREEN
ABO/RH(D): A NEG
Antibody Screen: NEGATIVE

## 2014-01-09 LAB — SURGICAL PCR SCREEN
MRSA, PCR: NEGATIVE
Staphylococcus aureus: NEGATIVE

## 2014-01-09 LAB — CBC WITH DIFFERENTIAL/PLATELET
Basophils Absolute: 0 10*3/uL (ref 0.0–0.1)
Basophils Relative: 0 % (ref 0–1)
EOS ABS: 0 10*3/uL (ref 0.0–0.7)
Eosinophils Relative: 0 % (ref 0–5)
HEMATOCRIT: 38.2 % (ref 36.0–46.0)
HEMOGLOBIN: 13.2 g/dL (ref 12.0–15.0)
Lymphocytes Relative: 12 % (ref 12–46)
Lymphs Abs: 1.4 10*3/uL (ref 0.7–4.0)
MCH: 30.7 pg (ref 26.0–34.0)
MCHC: 34.6 g/dL (ref 30.0–36.0)
MCV: 88.8 fL (ref 78.0–100.0)
MONOS PCT: 6 % (ref 3–12)
Monocytes Absolute: 0.7 10*3/uL (ref 0.1–1.0)
NEUTROS PCT: 82 % — AB (ref 43–77)
Neutro Abs: 9.2 10*3/uL — ABNORMAL HIGH (ref 1.7–7.7)
Platelets: 169 10*3/uL (ref 150–400)
RBC: 4.3 MIL/uL (ref 3.87–5.11)
RDW: 13.4 % (ref 11.5–15.5)
WBC: 11.4 10*3/uL — ABNORMAL HIGH (ref 4.0–10.5)

## 2014-01-09 LAB — URINE MICROSCOPIC-ADD ON

## 2014-01-09 LAB — PROTIME-INR
INR: 0.98 (ref 0.00–1.49)
Prothrombin Time: 13 seconds (ref 11.6–15.2)

## 2014-01-09 MED ORDER — MORPHINE SULFATE 2 MG/ML IJ SOLN
INTRAMUSCULAR | Status: AC
  Filled 2014-01-09: qty 1

## 2014-01-09 MED ORDER — FENTANYL CITRATE 0.05 MG/ML IJ SOLN
50.0000 ug | INTRAMUSCULAR | Status: AC | PRN
  Administered 2014-01-09 (×2): 50 ug via INTRAVENOUS
  Filled 2014-01-09 (×2): qty 2

## 2014-01-09 MED ORDER — ASPIRIN EC 81 MG PO TBEC
81.0000 mg | DELAYED_RELEASE_TABLET | Freq: Every day | ORAL | Status: DC
  Filled 2014-01-09 (×2): qty 1

## 2014-01-09 MED ORDER — ALBUTEROL SULFATE (2.5 MG/3ML) 0.083% IN NEBU
2.5000 mg | INHALATION_SOLUTION | Freq: Four times a day (QID) | RESPIRATORY_TRACT | Status: DC
  Administered 2014-01-09 (×2): 2.5 mg via RESPIRATORY_TRACT
  Filled 2014-01-09 (×2): qty 3

## 2014-01-09 MED ORDER — FENTANYL CITRATE 0.05 MG/ML IJ SOLN
50.0000 ug | INTRAMUSCULAR | Status: DC | PRN
  Administered 2014-01-09: 50 ug via INTRAVENOUS
  Filled 2014-01-09: qty 2

## 2014-01-09 MED ORDER — SODIUM CHLORIDE 0.9 % IV SOLN
INTRAVENOUS | Status: DC
  Administered 2014-01-09 – 2014-01-10 (×2): via INTRAVENOUS

## 2014-01-09 MED ORDER — CETYLPYRIDINIUM CHLORIDE 0.05 % MT LIQD
7.0000 mL | Freq: Two times a day (BID) | OROMUCOSAL | Status: DC
  Administered 2014-01-09 – 2014-01-11 (×4): 7 mL via OROMUCOSAL

## 2014-01-09 MED ORDER — NITROGLYCERIN 0.4 MG SL SUBL: 0.4000 mg | SUBLINGUAL_TABLET | SUBLINGUAL | Status: DC | PRN

## 2014-01-09 MED ORDER — METOPROLOL SUCCINATE ER 100 MG PO TB24
100.0000 mg | ORAL_TABLET | Freq: Every day | ORAL | Status: DC
  Administered 2014-01-09 – 2014-01-13 (×5): 100 mg via ORAL
  Filled 2014-01-09 (×5): qty 1

## 2014-01-09 MED ORDER — HYDROCODONE-ACETAMINOPHEN 5-325 MG PO TABS
1.0000 | ORAL_TABLET | Freq: Four times a day (QID) | ORAL | Status: DC | PRN
  Administered 2014-01-09: 1 via ORAL
  Administered 2014-01-09: 2 via ORAL
  Filled 2014-01-09 (×2): qty 2

## 2014-01-09 MED ORDER — ADULT MULTIVITAMIN W/MINERALS CH
1.0000 | ORAL_TABLET | Freq: Every day | ORAL | Status: DC
  Administered 2014-01-09 – 2014-01-11 (×2): 1 via ORAL
  Filled 2014-01-09 (×5): qty 1

## 2014-01-09 MED ORDER — ALBUTEROL SULFATE (2.5 MG/3ML) 0.083% IN NEBU
2.5000 mg | INHALATION_SOLUTION | Freq: Two times a day (BID) | RESPIRATORY_TRACT | Status: DC
  Administered 2014-01-10 – 2014-01-11 (×2): 2.5 mg via RESPIRATORY_TRACT
  Filled 2014-01-09 (×2): qty 3

## 2014-01-09 MED ORDER — MORPHINE SULFATE 2 MG/ML IJ SOLN
1.0000 mg | INTRAMUSCULAR | Status: DC | PRN
  Administered 2014-01-09 – 2014-01-10 (×4): 1 mg via INTRAVENOUS
  Filled 2014-01-09 (×4): qty 1

## 2014-01-09 MED ORDER — POLYETHYLENE GLYCOL 3350 17 G PO PACK
17.0000 g | PACK | Freq: Every day | ORAL | Status: DC
  Administered 2014-01-10 – 2014-01-13 (×5): 17 g via ORAL

## 2014-01-09 MED ORDER — ONDANSETRON HCL 4 MG/2ML IJ SOLN
4.0000 mg | Freq: Once | INTRAMUSCULAR | Status: AC
  Administered 2014-01-09: 4 mg via INTRAVENOUS
  Filled 2014-01-09: qty 2

## 2014-01-09 MED ORDER — ASPIRIN 81 MG PO TBEC: 81.0000 mg | DELAYED_RELEASE_TABLET | Freq: Every day | ORAL | Status: DC

## 2014-01-09 MED ORDER — MORPHINE SULFATE 2 MG/ML IJ SOLN
0.5000 mg | INTRAMUSCULAR | Status: DC | PRN
  Administered 2014-01-09 (×2): 0.5 mg via INTRAVENOUS
  Filled 2014-01-09: qty 1

## 2014-01-09 NOTE — ED Notes (Signed)
Bed: WA03 Expected date:  Expected time:  Means of arrival:  Comments: Hip from triage

## 2014-01-09 NOTE — ED Notes (Signed)
Pt is requesting pain medications, will notify Dr Alvino Chapel

## 2014-01-09 NOTE — Progress Notes (Signed)
INITIAL NUTRITION ASSESSMENT  DOCUMENTATION CODES Per approved criteria  -Not Applicable   INTERVENTION: - Diet advancement per MD - Encouraged high calorie/protein diet to promote strength building once diet advanced  - RD to continue to monitor   NUTRITION DIAGNOSIS: Inadequate oral intake related to inability to eat as evidenced by NPO.   Goal: Advance diet as tolerated to dysphagia 3/thin  Monitor:  Weights, labs, diet advancement  Reason for Assessment: Consult for hip fracture   80 y.o. female  Admitting Dx: Closed right hip fracture  ASSESSMENT: Pt with past medical history of tobacco abuse, probable COPD, history of CVA and acute MI back in 2002. Patient came in to the hospital because of right hip pain. Patient said she fell yesterday in her kitchen she slipped on water, she denies any symptoms prior to the fall. Found to have right hip fracture.   - Pt and family in room - Pt reports her appetite has been down the past 5-6 months - States she eats "whatever appeals" to her, recently it's been some tomatoes, usually just 1 meal/snack per day - Not on any nutritional supplements at home - Weight stable - Denies any problems swallowing but does need chopped meats due to problems chewing   Height: Ht Readings from Last 1 Encounters:  01/09/14 5\' 1"  (1.549 m)    Weight: Wt Readings from Last 1 Encounters:  01/09/14 135 lb (61.236 kg)    Ideal Body Weight: 105 lbs  % Ideal Body Weight: 128%  Wt Readings from Last 10 Encounters:  01/09/14 135 lb (61.236 kg)  05/30/13 132 lb 9.6 oz (60.147 kg)  05/01/13 134 lb 2 oz (60.839 kg)  04/04/13 133 lb (60.328 kg)  03/25/13 133 lb 9.6 oz (60.601 kg)  03/20/13 133 lb (60.328 kg)  02/14/13 135 lb (61.236 kg)  10/18/11 133 lb (60.328 kg)  01/19/11 138 lb (62.596 kg)  02/18/09 134 lb 8 oz (61.009 kg)    Usual Body Weight: 135 lbs  % Usual Body Weight: 100%  BMI:  Body mass index is 25.52 kg/(m^2).  Estimated  Nutritional Needs: Kcal: 1300-1500 Protein: 60-75g Fluid: 1.3-1.5L/day   Skin: intact  Diet Order: NPO  EDUCATION NEEDS: -No education needs identified at this time  No intake or output data in the 24 hours ending 01/09/14 1633  Last BM: 8/20   Labs:   Recent Labs Lab 01/09/14 1009  NA 129*  K 5.0  CL 93*  CO2 20  BUN 22  CREATININE 0.94  CALCIUM 9.3  GLUCOSE 133*    CBG (last 3)  No results found for this basename: GLUCAP,  in the last 72 hours  Scheduled Meds: . albuterol  2.5 mg Nebulization QID  . antiseptic oral rinse  7 mL Mouth Rinse BID  . aspirin EC  81 mg Oral Daily  . metoprolol succinate  100 mg Oral Daily  . multivitamin with minerals  1 tablet Oral QHS  . polyethylene glycol  17 g Oral Daily    Continuous Infusions: . sodium chloride 75 mL/hr at 01/09/14 1455    Past Medical History  Diagnosis Date  . Carotid artery occlusion   . Stroke 2003  . Hiatal hernia   . Headache(784.0)   . Gallstones   . Common bile duct dilation   . Abdominal aortic atherosclerosis   . HLD (hyperlipidemia)   . CAD (coronary artery disease)   . Myocardial infarction   . PONV (postoperative nausea and vomiting)  Past Surgical History  Procedure Laterality Date  . Abdominal hysterectomy    . Rotator cuff repair Left   . Tonsillectomy    . Bladder surgery    . Appendectomy    . Ankle fracture surgery    . Cholecystectomy N/A 05/05/2013    Procedure: LAPAROSCOPIC CHOLECYSTECTOMY WITH INTRAOPERATIVE CHOLANGIOGRAM;  Surgeon: Ralene Ok, MD;  Location: [facility];  Service: General;  Laterality: N/A;    Carlis Stable MS, [place], LDN [phone] Pager [phone] Weekend/After Hours Pager

## 2014-01-09 NOTE — Progress Notes (Signed)
UR completed 

## 2014-01-09 NOTE — Progress Notes (Signed)
  CARE MANAGEMENT ED NOTE 01/09/2014  Patient:  [name], [name]   Account Number:  [id number]  Date Initiated:  01/09/2014  Documentation initiated by:  Jackelyn Poling  Subjective/Objective Assessment:   80 yr old [employer] [place] medicare complete [place]  pt /o R hip/pelvic pain after a fall last night.  Pain score 10/10.  Pt reports that she slipped on water and fell.  Sts she can not straighten leg w/o excruciating pain.     Subjective/Objective Assessment Detail:   Sts she has been crawling around her house.  Pt took a Vicodin, last night, w/ a little relief.    Dx Hip fracture requiring operative repair, right, closed, initial encounter    pcp Dr Leonard Downing     Action/Plan:   ED CM spoke with pt, step daughter and grand daughter about home health services and provided choice Pending choice of agency Pt states she has not used an agency before    Action/Plan Detail:   Anticipated DC Date:  01/12/2014     Status Recommendation to Physician:   Result of Recommendation:    Other ED Services  Consult [facility]  Other  Outpatient Services - Pt will follow up   [facility] [place]   Choice offered to / List presented to:  C-1 Patient          Status of service:  Completed, signed off  ED Comments:   ED Comments Detail:

## 2014-01-09 NOTE — Consult Note (Signed)
Reason for Consult:Hip fracture Referring Physician: EDP  [name] is an 80 y.o. female.  HPI: Fell today. No LOC  Past Medical History  Diagnosis Date  . Carotid artery occlusion   . Stroke 2003  . Hiatal hernia   . Headache(784.0)   . Gallstones   . Common bile duct dilation   . Abdominal aortic atherosclerosis   . HLD (hyperlipidemia)   . CAD (coronary artery disease)   . Myocardial infarction   . PONV (postoperative nausea and vomiting)     Past Surgical History  Procedure Laterality Date  . Abdominal hysterectomy    . Rotator cuff repair Left   . Tonsillectomy    . Bladder surgery    . Appendectomy    . Ankle fracture surgery    . Cholecystectomy N/A 05/05/2013    Procedure: LAPAROSCOPIC CHOLECYSTECTOMY WITH INTRAOPERATIVE CHOLANGIOGRAM;  Surgeon: Armando Ramirez, MD;  Location: [facility] OR;  Service: General;  Laterality: N/A;    Family History  Problem Relation Age of Onset  . Stroke Mother   . Breast cancer Maternal Aunt   . Prostate cancer Maternal Grandfather     Social History:  reports that she has been smoking Cigarettes.  She has been smoking about 0.00 packs per day. She has never used smokeless tobacco. She reports that she does not drink alcohol or use illicit drugs.  Allergies:  Allergies  Allergen Reactions  . Dextroamphetamine Sulfate Er Other (See Comments)    Euphoric feeling    Medications: I have reviewed the patient's current medications.  Results for orders placed during the hospital encounter of 01/09/14 (from the past 48 hour(s))  BASIC METABOLIC PANEL     Status: Abnormal   Collection Time    01/09/14 10:09 AM      Result Value Ref Range   Sodium 129 (*) 137 - 147 mEq/L   Potassium 5.0  3.7 - 5.3 mEq/L   Comment: MARKED HEMOLYSIS     HEMOLYSIS AT THIS LEVEL MAY AFFECT RESULT   Chloride 93 (*) 96 - 112 mEq/L   CO2 20  19 - 32 mEq/L   Glucose, Bld 133 (*) 70 - 99 mg/dL   BUN 22  6 - 23 mg/dL   Creatinine, Ser 0.94  0.50 -  1.10 mg/dL   Calcium 9.3  8.4 - 10.5 mg/dL   GFR calc non Af Amer 55 (*) >90 mL/min   GFR calc Af Amer 63 (*) >90 mL/min   Comment: (NOTE)     The eGFR has been calculated using the CKD EPI equation.     This calculation has not been validated in all clinical situations.     eGFR's persistently <90 mL/min signify possible Chronic Kidney     Disease.   Anion gap 16 (*) 5 - 15  CBC WITH DIFFERENTIAL     Status: Abnormal   Collection Time    01/09/14 10:09 AM      Result Value Ref Range   WBC 11.4 (*) 4.0 - 10.5 K/uL   RBC 4.30  3.87 - 5.11 MIL/uL   Hemoglobin 13.2  12.0 - 15.0 g/dL   HCT 38.2  36.0 - 46.0 %   MCV 88.8  78.0 - 100.0 fL   MCH 30.7  26.0 - 34.0 pg   MCHC 34.6  30.0 - 36.0 g/dL   RDW 13.4  11.5 - 15.5 %   Platelets 169  150 - 400 K/uL   Neutrophils Relative % 82 (*) 43 -   77 %   Neutro Abs 9.2 (*) 1.7 - 7.7 K/uL   Lymphocytes Relative 12  12 - 46 %   Lymphs Abs 1.4  0.7 - 4.0 K/uL   Monocytes Relative 6  3 - 12 %   Monocytes Absolute 0.7  0.1 - 1.0 K/uL   Eosinophils Relative 0  0 - 5 %   Eosinophils Absolute 0.0  0.0 - 0.7 K/uL   Basophils Relative 0  0 - 1 %   Basophils Absolute 0.0  0.0 - 0.1 K/uL  PROTIME-INR     Status: None   Collection Time    01/09/14 10:09 AM      Result Value Ref Range   Prothrombin Time 13.0  11.6 - 15.2 seconds   INR 0.98  0.00 - 1.49  TYPE AND SCREEN     Status: None   Collection Time    01/09/14 10:15 AM      Result Value Ref Range   ABO/RH(D) A NEG     Antibody Screen NEG     Sample Expiration 01/12/2014      Dg Hip Complete Right  01/09/2014   CLINICAL DATA:  RIGHT hip pain post fall  EXAM: RIGHT HIP - COMPLETE 2+ VIEW  COMPARISON:  None  FINDINGS: Osseous demineralization.  Displaced subcapital fracture RIGHT femoral neck.  BILATERAL narrowing of the hip joints.  Pelvis appears intact.  No dislocation or additional fracture identified.  IMPRESSION: Displaced subcapital fracture RIGHT femoral neck.   Electronically Signed    By: Lavonia Dana M.D.   On: 01/09/2014 11:02   Dg Chest Port 1 View  01/09/2014   CLINICAL DATA:  Hip fracture, fall, smoker, coronary artery disease post MI, preoperative evaluation  EXAM: PORTABLE CHEST - 1 VIEW  COMPARISON:  02/14/2013  FINDINGS: Upper normal heart size.  Atherosclerotic calcification aorta.  Slight pulmonary vascular congestion.  Mediastinal contours otherwise normal.  Lungs clear.  No pleural effusion or pneumothorax.  Bones demineralized.  IMPRESSION: No acute abnormalities.   Electronically Signed   By: Lavonia Dana M.D.   On: 01/09/2014 11:01    Review of Systems  Musculoskeletal: Positive for joint pain.  All other systems reviewed and are negative.  Blood pressure 140/78, pulse 74, temperature 98.8 F (37.1 C), temperature source Oral, resp. rate 19, SpO2 99.00%. Physical Exam  Constitutional: She is oriented to person, place, and time. She appears well-developed.  HENT:  Head: Normocephalic.  Eyes: Pupils are equal, round, and reactive to light.  Neck: Normal range of motion.  Cardiovascular: Normal rate.   Respiratory: Effort normal.  GI: Soft.  Musculoskeletal:  Short ER hip right. NVI.  Neurological: She is alert and oriented to person, place, and time.  Skin: Skin is warm and dry.  Psychiatric: She has a normal mood and affect.    Assessment/Plan: Displace right femoral neck fracture Plan hemiarthroplasty. Risks discussed.  Steven Basso C 01/09/2014, 1:21 PM

## 2014-01-09 NOTE — Progress Notes (Signed)
Pt was lying in bed and awake when I arrived. Pt's daughter and granddaughter were bedside. Pt teasingly said she was awful and stubborn. I teased and said we are stubborn about getting her better - she and family enjoyed the humor. Pt's daughter and granddaughter acknowledged it is too soon for them to be back in the hospital after the recent loss of pt's husband. Pt was going in and out of sleep so I completed visit. pls call if additional spt is needed. Ernest Haber Chaplain [phone]  01/09/14 1500  Clinical Encounter Type  Visited With Patient and family together

## 2014-01-09 NOTE — ED Notes (Signed)
Pt c/o R hip/pelvic pain after a fall last night.  Pain score 10/10.  Pt reports that she slipped on water and fell.  Sts she can not straighten leg w/o excruciating pain.  Sts she has been crawling around her house.  Pt took a Vicodin, last night, w/ a little relief.

## 2014-01-09 NOTE — Progress Notes (Signed)
Choice provided to pt, step daughter and grand daughter  HOME HEALTH AGENCIES SERVING [place]   Agencies that are Medicare-Certified and are affiliated with [place]  Telephone Number Address  [employer] has ownership interest in this company; however, you are under no obligation to use this agency. [phone] or  [place] [address], [place] [address] [email or web address]   Agencies that are Medicare-Certified and are not affiliated with [place] Telephone Number Address  [facility] [phone] Fax [phone] [address], [place], [place]  [address] [email or web address]  [facility] [phone] Fax [phone] [address]   [place], [place]  [address] [email or web address]  [facility] [phone] Fax [phone] [place], [place] [address] [email or web address]  [place] [phone] Fax [phone] [place], [place]  [address] [email or web address]  [facility] [phone] Fax [phone] [address], [place], [place] [address] [email or web address]  [facility] [place], [place] [address] [email or web address]  [facility] [phone]  [address], [place] [address] [email or web address]  [facility] [phone] or [phone] Fax number [phone] [address], [place] [address] [place], [place]  [address] [email or web address]  [place] [phone] Fax [phone] [place], [place]   [address]  [place]  [phone] Fax [phone] [address] [place], [place] [address] [email or web address]

## 2014-01-09 NOTE — ED Provider Notes (Signed)
CSN: [id number]     Arrival date & time 01/09/14  0941 History   First MD Initiated Contact with Patient 01/09/14 [phone]     Chief Complaint  Patient presents with  . Fall  . Hip Pain     (Consider location/radiation/quality/duration/timing/severity/associated sxs/prior Treatment) Patient is a 80 y.o. female presenting with fall and hip pain. The history is provided by the patient.  Fall This is a new problem. Pertinent negatives include no chest pain, no abdominal pain, no headaches and no shortness of breath.  Hip Pain Pertinent negatives include no chest pain, no abdominal pain, no headaches and no shortness of breath.   patient slipped on some water in her kitchen and fell last night. She had been unable to walk since. Pain in her right hip. She states it hurt more once EMS was taken here. She's not complaining of any other pain. No fevers. She has a chronic cough and smokes a pack a day. No chest pain. No abdominal pain. No back pain.  Past Medical History  Diagnosis Date  . Carotid artery occlusion   . Stroke 2003  . Hiatal hernia   . Headache(784.0)   . Gallstones   . Common bile duct dilation   . Abdominal aortic atherosclerosis   . HLD (hyperlipidemia)   . CAD (coronary artery disease)   . Myocardial infarction   . PONV (postoperative nausea and vomiting)    Past Surgical History  Procedure Laterality Date  . Abdominal hysterectomy    . Rotator cuff repair Left   . Tonsillectomy    . Bladder surgery    . Appendectomy    . Ankle fracture surgery    . Cholecystectomy N/A 05/05/2013    Procedure: LAPAROSCOPIC CHOLECYSTECTOMY WITH INTRAOPERATIVE CHOLANGIOGRAM;  Surgeon: Ralene Ok, MD;  Location: [facility];  Service: General;  Laterality: N/A;   Family History  Problem Relation Age of Onset  . Stroke Mother   . Breast cancer Maternal Aunt   . Prostate cancer Maternal Grandfather    History  Substance Use Topics  . Smoking status: Current Every Day Smoker   Types: Cigarettes  . Smokeless tobacco: Never Used  . Alcohol Use: No   OB History   Grav Para Term Preterm Abortions TAB SAB Ect Mult Living                 Review of Systems  Constitutional: Negative for activity change and appetite change.  Eyes: Negative for pain.  Respiratory: Positive for cough. Negative for chest tightness and shortness of breath.   Cardiovascular: Negative for chest pain and leg swelling.  Gastrointestinal: Negative for nausea, vomiting, abdominal pain and diarrhea.  Genitourinary: Negative for flank pain.  Musculoskeletal: Negative for back pain and neck stiffness.       Left hip pain  Skin: Negative for rash.  Neurological: Negative for weakness, numbness and headaches.  Psychiatric/Behavioral: Negative for behavioral problems.      Allergies  Dextroamphetamine sulfate er  Home Medications   Prior to Admission medications   Medication Sig Start Date End Date Taking? Authorizing Provider  aspirin 81 MG EC tablet Take 81 mg by mouth daily.     Yes Historical Provider, MD  Calcium Carbonate-Vitamin D (CALCIUM + D PO) Take 1 tablet by mouth daily.   Yes Historical Provider, MD  Glucosamine-Chondroitin (GLUCOSAMINE CHONDR COMPLEX PO) Take 1 capsule by mouth daily.   Yes Historical Provider, MD  HYDROcodone-acetaminophen (NORCO) 10-325 MG per tablet Take 1 tablet by  mouth every 6 (six) hours as needed.   Yes Historical Provider, MD  lovastatin (MEVACOR) 40 MG tablet Take 1 tablet (40 mg total) by mouth at bedtime. 04/23/13  Yes Fay Records, MD  metoprolol succinate (TOPROL-XL) 100 MG 24 hr tablet Take 100 mg by mouth daily. Take with or immediately following a meal.   Yes Historical Provider, MD  Multiple Vitamin (MULTIVITAMIN WITH MINERALS) TABS tablet Take 1 tablet by mouth at bedtime.   Yes Historical Provider, MD  nitroGLYCERIN (NITROSTAT) 0.4 MG SL tablet Place 0.4 mg under the tongue every 5 (five) minutes as needed for chest pain.   Yes Historical  Provider, MD  docusate sodium (COLACE) 100 MG capsule Take 1 capsule (100 mg total) by mouth 2 (two) times daily as needed for mild constipation. 01/10/14   Johnn Hai, MD  docusate sodium (COLACE) 100 MG capsule Take 1 capsule (100 mg total) by mouth 2 (two) times daily as needed for mild constipation. 01/10/14   Johnn Hai, MD  enoxaparin (LOVENOX) 30 MG/0.3ML injection Inject 0.3 mLs (30 mg total) into the skin daily. 01/10/14   Johnn Hai, MD  enoxaparin (LOVENOX) 30 MG/0.3ML injection Inject 0.3 mLs (30 mg total) into the skin daily. 01/10/14   Johnn Hai, MD  HYDROcodone-acetaminophen (NORCO/VICODIN) 5-325 MG per tablet Take 1 tablet by mouth every 4 (four) hours as needed. 01/10/14   Johnn Hai, MD  HYDROcodone-acetaminophen (NORCO/VICODIN) 5-325 MG per tablet Take 1 tablet by mouth every 4 (four) hours as needed. 01/10/14   Johnn Hai, MD   BP 161/72  Pulse 70  Temp(Src) 99.4 F (37.4 C) (Oral)  Resp 16  Ht 5\' 1"  (1.549 m)  Wt 135 lb (61.236 kg)  BMI 25.52 kg/m2  SpO2 97% Physical Exam  Nursing note and vitals reviewed. Constitutional: She is oriented to person, place, and time. She appears well-developed and well-nourished.  HENT:  Head: Normocephalic and atraumatic.  Eyes: EOM are normal. Pupils are equal, round, and reactive to light.  Neck: Normal range of motion. Neck supple.  Cardiovascular: Normal rate, regular rhythm and normal heart sounds.   No murmur heard. Pulmonary/Chest: Effort normal. No respiratory distress. She has no wheezes. She has no rales.  Mildly harsh breath sounds diffusely  Abdominal: Soft. Bowel sounds are normal. She exhibits no distension. There is no tenderness. There is no rebound and no guarding.  Musculoskeletal: She exhibits tenderness.  No tenderness to upper extremities or left lower extremity. Tenderness to her right hip. Moderate tenderness. Patient held somewhat flexed and slightly externally rotated neurovascular  intact over her foot.  Neurological: She is alert and oriented to person, place, and time. No cranial nerve deficit.  Skin: Skin is warm and dry.  Psychiatric: She has a normal mood and affect. Her speech is normal.    ED Course  Procedures (including critical care time) Labs Review Labs Reviewed  BASIC METABOLIC PANEL - Abnormal; Notable for the following:    Sodium 129 (*)    Chloride 93 (*)    Glucose, Bld 133 (*)    GFR calc non Af Amer 55 (*)    GFR calc Af Amer 63 (*)    Anion gap 16 (*)    All other components within normal limits  CBC WITH DIFFERENTIAL - Abnormal; Notable for the following:    WBC 11.4 (*)    Neutrophils Relative % 82 (*)    Neutro Abs 9.2 (*)  All other components within normal limits  URINALYSIS, ROUTINE W REFLEX MICROSCOPIC - Abnormal; Notable for the following:    Hgb urine dipstick TRACE (*)    Protein, ur 100 (*)    All other components within normal limits  BASIC METABOLIC PANEL - Abnormal; Notable for the following:    Sodium 132 (*)    Glucose, Bld 166 (*)    GFR calc non Af Amer 62 (*)    GFR calc Af Amer 71 (*)    All other components within normal limits  CBC - Abnormal; Notable for the following:    RBC 3.50 (*)    Hemoglobin 10.8 (*)    HCT 31.8 (*)    Platelets 139 (*)    All other components within normal limits  BASIC METABOLIC PANEL - Abnormal; Notable for the following:    Sodium 134 (*)    Glucose, Bld 139 (*)    GFR calc non Af Amer 53 (*)    GFR calc Af Amer 62 (*)    All other components within normal limits  SURGICAL PCR SCREEN  PROTIME-INR  URINE MICROSCOPIC-ADD ON  CBC  TYPE AND SCREEN  ABO/RH    Imaging Review Dg Hip Complete Right  01/09/2014   CLINICAL DATA:  RIGHT hip pain post fall  EXAM: RIGHT HIP - COMPLETE 2+ VIEW  COMPARISON:  None  FINDINGS: Osseous demineralization.  Displaced subcapital fracture RIGHT femoral neck.  BILATERAL narrowing of the hip joints.  Pelvis appears intact.  No dislocation or  additional fracture identified.  IMPRESSION: Displaced subcapital fracture RIGHT femoral neck.   Electronically Signed   By: Lavonia Dana M.D.   On: 01/09/2014 11:02   Dg Pelvis Portable  01/10/2014   CLINICAL DATA:  Postop right hip arthroplasty  EXAM: PORTABLE PELVIS 1-2 VIEWS  COMPARISON:  Preoperative radiographs 01/09/2014  FINDINGS: Interval right hip arthroplasty without evidence of hardware complication. Expected surgical changes including subcutaneous air in the soft tissues about the right greater trochanter. The visualized bony pelvis is intact. The visualized left femur is unremarkable.  IMPRESSION: Surgical changes of right total hip arthroplasty without evidence of immediate complication.   Electronically Signed   By: Jacqulynn Cadet M.D.   On: 01/10/2014 10:51   Dg Chest Port 1 View  01/09/2014   CLINICAL DATA:  Hip fracture, fall, smoker, coronary artery disease post MI, preoperative evaluation  EXAM: PORTABLE CHEST - 1 VIEW  COMPARISON:  02/14/2013  FINDINGS: Upper normal heart size.  Atherosclerotic calcification aorta.  Slight pulmonary vascular congestion.  Mediastinal contours otherwise normal.  Lungs clear.  No pleural effusion or pneumothorax.  Bones demineralized.  IMPRESSION: No acute abnormalities.   Electronically Signed   By: Lavonia Dana M.D.   On: 01/09/2014 11:01     EKG Interpretation   Date/Time:  Friday January 09 2014 10:21:31 EDT Ventricular Rate:  70 PR Interval:  179 QRS Duration: 103 QT Interval:  429 QTC Calculation: 463 R Axis:   36 Text Interpretation:  Sinus rhythm Probable left atrial enlargement LVH  with secondary repolarization abnormality Anterior ST elevation, probably  due to LVH No significant change since last tracing Confirmed by Alvino Chapel   MD, Kayson Tasker [phone]) on 01/09/2014 10:48:03 AM      MDM   Final diagnoses:  Hip fracture requiring operative repair, right, closed, initial encounter    Patient with mechanical fall. Hip fracture.  Will admit to medicine with ortho consult.    Jasper Riling. Alvino Chapel, MD 01/11/14  0848 

## 2014-01-09 NOTE — H&P (Signed)
[facility] History and Physical  [name]:[id number] DOB: 1928/12/04 DOA: 01/09/2014  Referring physician: Dr. Alvino Chapel PCP: Leonard Downing, MD   Chief Complaint: Right hip pain  HPI: [name] is a 80 y.o. female with past medical history of tobacco abuse, probable COPD, history of CVA and acute MI back in 2002. Patient came in to the hospital because of right hip pain. Patient said she fell yesterday in her kitchen she slipped on water, she denies any symptoms prior to the fall. Denies any chest pain, shortness of breath, palpitations or flashing lights. No loss of consciousness after the fall. Patient had severe right hip pain after the fall but she managed to stay overnight at home and she came into the ED about 10 AM. In the ED x-ray shows a right subcapital hip fracture, Dr. Tonita Cong from orthopedics has been consulted, patient will be admitted to the [facility] for is medical and surgical evaluation.   Review of Systems:  Constitutional: negative for anorexia, fevers and sweats Eyes: negative for irritation, redness and visual disturbance Ears, nose, mouth, throat, and face: negative for earaches, epistaxis, nasal congestion and sore throat Respiratory: negative for cough, dyspnea on exertion, sputum and wheezing Cardiovascular: negative for chest pain, dyspnea, lower extremity edema, orthopnea, palpitations and syncope. Gastrointestinal: negative for abdominal pain, constipation, diarrhea, melena, nausea and vomiting Genitourinary:negative for dysuria, frequency and hematuria Hematologic/lymphatic: negative for bleeding, easy bruising and lymphadenopathy Musculoskeletal:negative for arthralgias, muscle weakness and stiff joints Neurological: negative for coordination problems, gait problems, headaches and weakness Endocrine: negative for diabetic symptoms including polydipsia, polyuria and weight loss Allergic/Immunologic: negative for anaphylaxis, hay fever  and urticaria  Past Medical History  Diagnosis Date  . Carotid artery occlusion   . Stroke 2003  . Hiatal hernia   . Headache(784.0)   . Gallstones   . Common bile duct dilation   . Abdominal aortic atherosclerosis   . HLD (hyperlipidemia)   . CAD (coronary artery disease)   . Myocardial infarction   . PONV (postoperative nausea and vomiting)    Past Surgical History  Procedure Laterality Date  . Abdominal hysterectomy    . Rotator cuff repair Left   . Tonsillectomy    . Bladder surgery    . Appendectomy    . Ankle fracture surgery    . Cholecystectomy N/A 05/05/2013    Procedure: LAPAROSCOPIC CHOLECYSTECTOMY WITH INTRAOPERATIVE CHOLANGIOGRAM;  Surgeon: Ralene Ok, MD;  Location: [facility];  Service: General;  Laterality: N/A;   Social History:   reports that she has been smoking Cigarettes.  She has been smoking about 0.00 packs per day. She has never used smokeless tobacco. She reports that she does not drink alcohol or use illicit drugs.  Allergies  Allergen Reactions  . Dextroamphetamine Sulfate Er Other (See Comments)    Euphoric feeling    Family History  Problem Relation Age of Onset  . Stroke Mother   . Breast cancer Maternal Aunt   . Prostate cancer Maternal Grandfather      Prior to Admission medications   Medication Sig Start Date End Date Taking? Authorizing Provider  aspirin 81 MG EC tablet Take 81 mg by mouth daily.     Yes Historical Provider, MD  Calcium Carbonate-Vitamin D (CALCIUM + D PO) Take 1 tablet by mouth daily.   Yes Historical Provider, MD  Glucosamine-Chondroitin (GLUCOSAMINE CHONDR COMPLEX PO) Take 1 capsule by mouth daily.   Yes Historical Provider, MD  HYDROcodone-acetaminophen (NORCO) 10-325 MG per tablet  Take 1 tablet by mouth every 6 (six) hours as needed.   Yes Historical Provider, MD  lovastatin (MEVACOR) 40 MG tablet Take 1 tablet (40 mg total) by mouth at bedtime. 04/23/13  Yes Fay Records, MD  metoprolol succinate (TOPROL-XL)  100 MG 24 hr tablet Take 100 mg by mouth daily. Take with or immediately following a meal.   Yes Historical Provider, MD  Multiple Vitamin (MULTIVITAMIN WITH MINERALS) TABS tablet Take 1 tablet by mouth at bedtime.   Yes Historical Provider, MD  nitroGLYCERIN (NITROSTAT) 0.4 MG SL tablet Place 0.4 mg under the tongue every 5 (five) minutes as needed for chest pain.   Yes Historical Provider, MD   Physical Exam: Filed Vitals:   01/09/14 1230  BP: 140/78  Pulse: 74  Temp:   Resp: 19   Constitutional: Oriented to person, place, and time. Well-developed and well-nourished. Cooperative.  Head: Normocephalic and atraumatic.  Nose: Nose normal.  Mouth/Throat: Uvula is midline, oropharynx is clear and moist and mucous membranes are normal.  Eyes: Conjunctivae and EOM are normal. Pupils are equal, round, and reactive to light.  Neck: Trachea normal and normal range of motion. Neck supple.  Cardiovascular: Normal rate, regular rhythm, S1 normal, S2 normal, normal heart sounds and intact distal pulses.   Pulmonary/Chest: Effort normal and breath sounds normal.  Abdominal: Soft. Bowel sounds are normal. There is no hepatosplenomegaly. There is no tenderness.  Musculoskeletal: Right lower extremity showed shortening and external rotation.  Neurological: Alert and oriented to person, place, and time. Has normal strength. No cranial nerve deficit or sensory deficit.  Skin: Skin is warm, dry and intact.  Psychiatric: Has a normal mood and affect. Speech is normal and behavior is normal.   Labs on Admission:  Basic Metabolic Panel:  Recent Labs Lab 01/09/14 1009  NA 129*  K 5.0  CL 93*  CO2 20  GLUCOSE 133*  BUN 22  CREATININE 0.94  CALCIUM 9.3   Liver Function Tests: No results found for this basename: AST, ALT, ALKPHOS, BILITOT, PROT, ALBUMIN,  in the last 168 hours No results found for this basename: LIPASE, AMYLASE,  in the last 168 hours No results found for this basename: AMMONIA,   in the last 168 hours CBC:  Recent Labs Lab 01/09/14 1009  WBC 11.4*  NEUTROABS 9.2*  HGB 13.2  HCT 38.2  MCV 88.8  PLT 169   Cardiac Enzymes: No results found for this basename: CKTOTAL, CKMB, CKMBINDEX, TROPONINI,  in the last 168 hours  BNP (last 3 results) No results found for this basename: PROBNP,  in the last 8760 hours CBG: No results found for this basename: GLUCAP,  in the last 168 hours  Radiological Exams on Admission: Dg Hip Complete Right  01/09/2014   CLINICAL DATA:  RIGHT hip pain post fall  EXAM: RIGHT HIP - COMPLETE 2+ VIEW  COMPARISON:  None  FINDINGS: Osseous demineralization.  Displaced subcapital fracture RIGHT femoral neck.  BILATERAL narrowing of the hip joints.  Pelvis appears intact.  No dislocation or additional fracture identified.  IMPRESSION: Displaced subcapital fracture RIGHT femoral neck.   Electronically Signed   By: Lavonia Dana M.D.   On: 01/09/2014 11:02   Dg Chest Port 1 View  01/09/2014   CLINICAL DATA:  Hip fracture, fall, smoker, coronary artery disease post MI, preoperative evaluation  EXAM: PORTABLE CHEST - 1 VIEW  COMPARISON:  02/14/2013  FINDINGS: Upper normal heart size.  Atherosclerotic calcification aorta.  Slight pulmonary vascular  congestion.  Mediastinal contours otherwise normal.  Lungs clear.  No pleural effusion or pneumothorax.  Bones demineralized.  IMPRESSION: No acute abnormalities.   Electronically Signed   By: Lavonia Dana M.D.   On: 01/09/2014 11:01    EKG: Independently reviewed.   Assessment/Plan Principal Problem:   Closed right hip fracture Active Problems:   TOBACCO ABUSE   CAROTID ARTERY OCCLUSION   CVA   Hypertension   Hip fracture requiring operative repair    Right hip fracture -Had a right hip fracture resulted from with severe like mechanical fall. -Right subcapital hip fracture, orthopedics consulted. -Patient placed on hip fracture protocol, heparin for DVT prophylaxis and narcotics for pain  management. -Likely she will need skilled nursing facility post discharge.  Cardiac risk stratification -Patient has history of CVA, acute MI in 2012, no diabetes or chronic kidney disease. -Patient functional status is good to excellent, has no problems with chest pain, SOB with ambulation. -She is class III for her revised cardiac risk index which means 6.6% chance to develop cardiac events post surgery. -Patient is on Toprol-XL 100 mg, continue. -Recommend to proceed with surgery without further workup.  Hyponatremia -Patient appears to be euvolemic, cannot rule out mild SIADH secondary to her smoking/probable COPD. -Patient started on normal saline at 75 mL/hour, check BMP in a.m.  Hypertension -Continue home medications.  Tobacco abuse -Bronchodilators as needed. I explained to her that she has a higher chance to develop lung infections after intubation because of history of smoking.  Code Status: Full code Family Communication: Plan discussed with the patient in the presence of her daughter at bedside Disposition Plan: MedSurg, inpatient  Time spent: 93 minutes  [facility] Pager [phone]

## 2014-01-10 ENCOUNTER — Inpatient Hospital Stay (HOSPITAL_COMMUNITY): Payer: Medicare Other | Admitting: Registered Nurse

## 2014-01-10 ENCOUNTER — Encounter (HOSPITAL_COMMUNITY): Payer: Medicare Other | Admitting: Registered Nurse

## 2014-01-10 ENCOUNTER — Encounter (HOSPITAL_COMMUNITY)
Admission: EM | Disposition: A | Discharge status: Home Health | Payer: Self-pay | Source: Home / Self Care | Attending: Internal Medicine

## 2014-01-10 ENCOUNTER — Inpatient Hospital Stay (HOSPITAL_COMMUNITY): Payer: Medicare Other

## 2014-01-10 ENCOUNTER — Encounter (HOSPITAL_COMMUNITY): Payer: Self-pay | Admitting: Registered Nurse

## 2014-01-10 DIAGNOSIS — I700 Atherosclerosis of aorta: Secondary | ICD-10-CM

## 2014-01-10 HISTORY — PX: HIP ARTHROPLASTY: SHX981

## 2014-01-10 LAB — BASIC METABOLIC PANEL
Anion gap: 11 (ref 5–15)
BUN: 16 mg/dL (ref 6–23)
CHLORIDE: 98 meq/L (ref 96–112)
CO2: 23 meq/L (ref 19–32)
CREATININE: 0.85 mg/dL (ref 0.50–1.10)
Calcium: 8.7 mg/dL (ref 8.4–10.5)
GFR calc Af Amer: 71 mL/min — ABNORMAL LOW (ref >90)
GFR calc non Af Amer: 62 mL/min — ABNORMAL LOW (ref >90)
Glucose, Bld: 166 mg/dL — ABNORMAL HIGH (ref 70–99)
Potassium: 4.2 mEq/L (ref 3.7–5.3)
Sodium: 132 mEq/L — ABNORMAL LOW (ref 137–147)

## 2014-01-10 LAB — CBC
HCT: 39.1 % (ref 36.0–46.0)
HEMOGLOBIN: 12.9 g/dL (ref 12.0–15.0)
MCH: 30.1 pg (ref 26.0–34.0)
MCHC: 33 g/dL (ref 30.0–36.0)
MCV: 91.4 fL (ref 78.0–100.0)
Platelets: 152 10*3/uL (ref 150–400)
RBC: 4.28 MIL/uL (ref 3.87–5.11)
RDW: 13.5 % (ref 11.5–15.5)
WBC: 10.4 10*3/uL (ref 4.0–10.5)

## 2014-01-10 LAB — ABO/RH: ABO/RH(D): A NEG

## 2014-01-10 SURGERY — HEMIARTHROPLASTY, HIP, DIRECT ANTERIOR APPROACH, FOR FRACTURE
Anesthesia: General | Site: Hip | Laterality: Right

## 2014-01-10 MED ORDER — CEFAZOLIN SODIUM-DEXTROSE 2-3 GM-% IV SOLR
2.0000 g | INTRAVENOUS | Status: AC
  Administered 2014-01-10: 2 g via INTRAVENOUS
  Filled 2014-01-10: qty 50

## 2014-01-10 MED ORDER — GLYCOPYRROLATE 0.2 MG/ML IJ SOLN
INTRAMUSCULAR | Status: DC | PRN
  Administered 2014-01-10: 0.2 mg via INTRAVENOUS
  Administered 2014-01-10: .4 mg via INTRAVENOUS

## 2014-01-10 MED ORDER — EPHEDRINE SULFATE 50 MG/ML IJ SOLN
INTRAMUSCULAR | Status: DC | PRN
  Administered 2014-01-10: 10 mg via INTRAVENOUS

## 2014-01-10 MED ORDER — FENTANYL CITRATE 0.05 MG/ML IJ SOLN
INTRAMUSCULAR | Status: DC | PRN
  Administered 2014-01-10 (×4): 50 ug via INTRAVENOUS

## 2014-01-10 MED ORDER — FENTANYL CITRATE 0.05 MG/ML IJ SOLN
INTRAMUSCULAR | Status: AC
  Filled 2014-01-10: qty 2

## 2014-01-10 MED ORDER — DOCUSATE SODIUM 100 MG PO CAPS: 100.0000 mg | ORAL_CAPSULE | Freq: Two times a day (BID) | ORAL | Status: DC | PRN

## 2014-01-10 MED ORDER — CEFAZOLIN SODIUM 1-5 GM-% IV SOLN
1.0000 g | Freq: Four times a day (QID) | INTRAVENOUS | Status: AC
  Administered 2014-01-10 (×2): 1 g via INTRAVENOUS
  Filled 2014-01-10 (×2): qty 50

## 2014-01-10 MED ORDER — ROCURONIUM BROMIDE 100 MG/10ML IV SOLN
INTRAVENOUS | Status: DC | PRN
  Administered 2014-01-10: 20 mg via INTRAVENOUS
  Administered 2014-01-10: 5 mg via INTRAVENOUS

## 2014-01-10 MED ORDER — KCL IN DEXTROSE-NACL 20-5-0.45 MEQ/L-%-% IV SOLN
INTRAVENOUS | Status: DC
  Filled 2014-01-10: qty 1000

## 2014-01-10 MED ORDER — HYDROCODONE-ACETAMINOPHEN 5-325 MG PO TABS: 1.0000 | ORAL_TABLET | ORAL | Status: DC | PRN

## 2014-01-10 MED ORDER — METOCLOPRAMIDE HCL 10 MG PO TABS: 5.0000 mg | ORAL_TABLET | Freq: Three times a day (TID) | ORAL | Status: DC | PRN

## 2014-01-10 MED ORDER — LIDOCAINE HCL (CARDIAC) 20 MG/ML IV SOLN
INTRAVENOUS | Status: AC
  Filled 2014-01-10: qty 5

## 2014-01-10 MED ORDER — ONDANSETRON HCL 4 MG PO TABS: 4.0000 mg | ORAL_TABLET | Freq: Four times a day (QID) | ORAL | Status: DC | PRN

## 2014-01-10 MED ORDER — LACTATED RINGERS IV SOLN
INTRAVENOUS | Status: DC | PRN
  Administered 2014-01-10: 07:00:00 via INTRAVENOUS

## 2014-01-10 MED ORDER — EPHEDRINE SULFATE 50 MG/ML IJ SOLN
INTRAMUSCULAR | Status: AC
  Filled 2014-01-10: qty 1

## 2014-01-10 MED ORDER — ONDANSETRON HCL 4 MG/2ML IJ SOLN: 4.0000 mg | Freq: Four times a day (QID) | INTRAMUSCULAR | Status: DC | PRN

## 2014-01-10 MED ORDER — PROMETHAZINE HCL 25 MG/ML IJ SOLN
INTRAMUSCULAR | Status: AC
  Filled 2014-01-10: qty 1

## 2014-01-10 MED ORDER — METOCLOPRAMIDE HCL 5 MG/ML IJ SOLN: 5.0000 mg | Freq: Three times a day (TID) | INTRAMUSCULAR | Status: DC | PRN

## 2014-01-10 MED ORDER — NEOSTIGMINE METHYLSULFATE 10 MG/10ML IV SOLN
INTRAVENOUS | Status: DC | PRN
  Administered 2014-01-10: 3 mg via INTRAVENOUS

## 2014-01-10 MED ORDER — PROMETHAZINE HCL 25 MG/ML IJ SOLN: 6.2500 mg | INTRAMUSCULAR | Status: DC | PRN

## 2014-01-10 MED ORDER — SODIUM CHLORIDE 0.9 % IR SOLN
Freq: Once | Status: DC
  Filled 2014-01-10: qty 1

## 2014-01-10 MED ORDER — FENTANYL CITRATE 0.05 MG/ML IJ SOLN
INTRAMUSCULAR | Status: AC
  Filled 2014-01-10: qty 5

## 2014-01-10 MED ORDER — PROPOFOL 10 MG/ML IV BOLUS
INTRAVENOUS | Status: AC
  Filled 2014-01-10: qty 20

## 2014-01-10 MED ORDER — PHENOL 1.4 % MT LIQD: 1.0000 | OROMUCOSAL | Status: DC | PRN

## 2014-01-10 MED ORDER — STERILE WATER FOR IRRIGATION IR SOLN
Status: DC | PRN
  Administered 2014-01-10: 1500 mL

## 2014-01-10 MED ORDER — ACETAMINOPHEN 325 MG PO TABS: 650.0000 mg | ORAL_TABLET | Freq: Four times a day (QID) | ORAL | Status: DC | PRN

## 2014-01-10 MED ORDER — MORPHINE SULFATE 2 MG/ML IJ SOLN
0.5000 mg | INTRAMUSCULAR | Status: DC | PRN
  Administered 2014-01-10: 0.5 mg via INTRAVENOUS
  Filled 2014-01-10: qty 1

## 2014-01-10 MED ORDER — MENTHOL 3 MG MT LOZG: 1.0000 | LOZENGE | OROMUCOSAL | Status: DC | PRN

## 2014-01-10 MED ORDER — ENOXAPARIN SODIUM 30 MG/0.3ML ~~LOC~~ SOLN: 30.0000 mg | SUBCUTANEOUS | Status: DC

## 2014-01-10 MED ORDER — SODIUM CHLORIDE 0.9 % IR SOLN
Status: AC
  Filled 2014-01-10: qty 1

## 2014-01-10 MED ORDER — CHLORHEXIDINE GLUCONATE 4 % EX LIQD
60.0000 mL | Freq: Once | CUTANEOUS | Status: AC
  Administered 2014-01-10: 4 via TOPICAL
  Filled 2014-01-10: qty 60

## 2014-01-10 MED ORDER — FENTANYL CITRATE 0.05 MG/ML IJ SOLN
25.0000 ug | INTRAMUSCULAR | Status: DC | PRN
  Administered 2014-01-10 (×2): 25 ug via INTRAVENOUS
  Administered 2014-01-10: 50 ug via INTRAVENOUS

## 2014-01-10 MED ORDER — ACETAMINOPHEN 650 MG RE SUPP: 650.0000 mg | Freq: Four times a day (QID) | RECTAL | Status: DC | PRN

## 2014-01-10 MED ORDER — GLYCOPYRROLATE 0.2 MG/ML IJ SOLN
INTRAMUSCULAR | Status: AC
Start: 2014-01-10 — End: 2014-01-10
  Filled 2014-01-10: qty 2

## 2014-01-10 MED ORDER — SODIUM CHLORIDE 0.9 % IJ SOLN
INTRAMUSCULAR | Status: AC
  Filled 2014-01-10: qty 10

## 2014-01-10 MED ORDER — LIDOCAINE HCL (CARDIAC) 20 MG/ML IV SOLN
INTRAVENOUS | Status: DC | PRN
  Administered 2014-01-10: 50 mg via INTRAVENOUS

## 2014-01-10 MED ORDER — SODIUM CHLORIDE 0.9 % IV SOLN
INTRAVENOUS | Status: DC
  Administered 2014-01-10 – 2014-01-11 (×2): via INTRAVENOUS

## 2014-01-10 MED ORDER — DEXAMETHASONE SODIUM PHOSPHATE 10 MG/ML IJ SOLN
INTRAMUSCULAR | Status: DC | PRN
  Administered 2014-01-10: 10 mg via INTRAVENOUS

## 2014-01-10 MED ORDER — ONDANSETRON HCL 4 MG/2ML IJ SOLN
INTRAMUSCULAR | Status: DC | PRN
  Administered 2014-01-10: 4 mg via INTRAVENOUS

## 2014-01-10 MED ORDER — SODIUM CHLORIDE 0.9 % IR SOLN: Freq: Once | Status: DC

## 2014-01-10 MED ORDER — ONDANSETRON HCL 4 MG/2ML IJ SOLN
INTRAMUSCULAR | Status: AC
  Filled 2014-01-10: qty 2

## 2014-01-10 MED ORDER — MEPERIDINE HCL 25 MG/ML IJ SOLN: 6.2500 mg | INTRAMUSCULAR | Status: DC | PRN

## 2014-01-10 MED ORDER — CEFAZOLIN SODIUM-DEXTROSE 2-3 GM-% IV SOLR
INTRAVENOUS | Status: AC
  Filled 2014-01-10: qty 50

## 2014-01-10 MED ORDER — ATROPINE SULFATE 0.4 MG/ML IJ SOLN
INTRAMUSCULAR | Status: AC
  Filled 2014-01-10: qty 2

## 2014-01-10 MED ORDER — PROPOFOL 10 MG/ML IV BOLUS
INTRAVENOUS | Status: DC | PRN
  Administered 2014-01-10: 100 mg via INTRAVENOUS

## 2014-01-10 MED ORDER — ENOXAPARIN SODIUM 30 MG/0.3ML ~~LOC~~ SOLN
30.0000 mg | SUBCUTANEOUS | Status: DC
  Administered 2014-01-11 – 2014-01-13 (×3): 30 mg via SUBCUTANEOUS
  Filled 2014-01-10 (×4): qty 0.3

## 2014-01-10 MED ORDER — HYDROCODONE-ACETAMINOPHEN 5-325 MG PO TABS
1.0000 | ORAL_TABLET | Freq: Four times a day (QID) | ORAL | Status: DC | PRN
  Administered 2014-01-10: 2 via ORAL
  Administered 2014-01-10 – 2014-01-11 (×4): 1 via ORAL
  Administered 2014-01-12 – 2014-01-13 (×6): 2 via ORAL
  Filled 2014-01-10: qty 1
  Filled 2014-01-10: qty 2
  Filled 2014-01-10: qty 1
  Filled 2014-01-10 (×5): qty 2
  Filled 2014-01-10: qty 1
  Filled 2014-01-10: qty 2
  Filled 2014-01-10: qty 1
  Filled 2014-01-10: qty 2

## 2014-01-10 MED ORDER — ACETAMINOPHEN 10 MG/ML IV SOLN
1000.0000 mg | Freq: Once | INTRAVENOUS | Status: DC
  Filled 2014-01-10: qty 100

## 2014-01-10 MED ORDER — DEXAMETHASONE SODIUM PHOSPHATE 10 MG/ML IJ SOLN
INTRAMUSCULAR | Status: AC
  Filled 2014-01-10: qty 1

## 2014-01-10 MED ORDER — KCL IN DEXTROSE-NACL 20-5-0.45 MEQ/L-%-% IV SOLN
INTRAVENOUS | Status: AC
  Filled 2014-01-10: qty 1000

## 2014-01-10 SURGICAL SUPPLY — 52 items
BAG SPEC THK2 15X12 ZIP CLS (MISCELLANEOUS) ×2
BAG ZIPLOCK 12X15 (MISCELLANEOUS) ×6 IMPLANT
BLADE SAW SGTL 18X1.27X75 (BLADE) ×2 IMPLANT
BLADE SAW SGTL 18X1.27X75MM (BLADE) ×1
BRUSH FEMORAL CANAL (MISCELLANEOUS) ×1 IMPLANT
CAPT HIP HD POR BIPOL/UNIPOL ×2 IMPLANT
CHLORAPREP W/TINT 26ML (MISCELLANEOUS) IMPLANT
CLOTH 2% CHLOROHEXIDINE 3PK (PERSONAL CARE ITEMS) ×3 IMPLANT
DRAPE INCISE IOBAN 66X45 STRL (DRAPES) ×3 IMPLANT
DRAPE ORTHO SPLIT 77X108 STRL (DRAPES) ×3
DRAPE POUCH INSTRU U-SHP 10X18 (DRAPES) ×3 IMPLANT
DRAPE SURG ORHT 6 SPLT 77X108 (DRAPES) ×1 IMPLANT
DRAPE U-SHAPE 47X51 STRL (DRAPES) ×3 IMPLANT
DRSG AQUACEL AG ADV 3.5X10 (GAUZE/BANDAGES/DRESSINGS) ×2 IMPLANT
DRSG EMULSION OIL 3X16 NADH (GAUZE/BANDAGES/DRESSINGS) ×3 IMPLANT
DRSG PAD ABDOMINAL 8X10 ST (GAUZE/BANDAGES/DRESSINGS) ×3 IMPLANT
DURAPREP 26ML APPLICATOR (WOUND CARE) ×3 IMPLANT
ELECT REM PT RETURN 9FT ADLT (ELECTROSURGICAL) ×3
ELECTRODE REM PT RTRN 9FT ADLT (ELECTROSURGICAL) ×1 IMPLANT
EVACUATOR 1/8 PVC DRAIN (DRAIN) ×3 IMPLANT
FACESHIELD WRAPAROUND (MASK) ×12 IMPLANT
FACESHIELD WRAPAROUND OR TEAM (MASK) ×4 IMPLANT
GAUZE SPONGE 4X4 12PLY STRL (GAUZE/BANDAGES/DRESSINGS) ×3 IMPLANT
GLOVE BIOGEL PI IND STRL 7.5 (GLOVE) ×1 IMPLANT
GLOVE BIOGEL PI IND STRL 8 (GLOVE) ×1 IMPLANT
GLOVE BIOGEL PI INDICATOR 7.5 (GLOVE) ×2
GLOVE BIOGEL PI INDICATOR 8 (GLOVE) ×2
GLOVE SURG SS PI 7.5 STRL IVOR (GLOVE) ×3 IMPLANT
GLOVE SURG SS PI 8.0 STRL IVOR (GLOVE) ×6 IMPLANT
GOWN STRL REUS W/ TWL XL LVL3 (GOWN DISPOSABLE) ×2 IMPLANT
GOWN STRL REUS W/TWL LRG LVL3 (GOWN DISPOSABLE) ×3 IMPLANT
GOWN STRL REUS W/TWL XL LVL3 (GOWN DISPOSABLE) ×12 IMPLANT
HANDPIECE INTERPULSE COAX TIP (DISPOSABLE)
IMMOBILIZER KNEE 20 (SOFTGOODS)
IMMOBILIZER KNEE 20 THIGH 36 (SOFTGOODS) IMPLANT
KIT BASIN OR (CUSTOM PROCEDURE TRAY) ×3 IMPLANT
MANIFOLD NEPTUNE II (INSTRUMENTS) ×3 IMPLANT
NS IRRIG 1000ML POUR BTL (IV SOLUTION) ×3 IMPLANT
PACK TOTAL JOINT (CUSTOM PROCEDURE TRAY) ×3 IMPLANT
POSITIONER SURGICAL ARM (MISCELLANEOUS) ×3 IMPLANT
SET HNDPC FAN SPRY TIP SCT (DISPOSABLE) ×1 IMPLANT
STAPLER VISISTAT (STAPLE) ×3 IMPLANT
SUT ETHIBOND NAB CT1 #1 30IN (SUTURE) ×13 IMPLANT
SUT VIC AB 0 CT1 27 (SUTURE) ×6
SUT VIC AB 0 CT1 27XBRD ANTBC (SUTURE) ×2 IMPLANT
SUT VIC AB 1 CT1 27 (SUTURE) ×12
SUT VIC AB 1 CT1 27XBRD ANTBC (SUTURE) ×4 IMPLANT
SUT VIC AB 2-0 CT1 27 (SUTURE) ×6
SUT VIC AB 2-0 CT1 TAPERPNT 27 (SUTURE) ×2 IMPLANT
SUT VLOC 180 0 24IN GS25 (SUTURE) ×2 IMPLANT
TOWER CARTRIDGE SMART MIX (DISPOSABLE) ×1 IMPLANT
WATER STERILE IRR 1500ML POUR (IV SOLUTION) ×3 IMPLANT

## 2014-01-10 NOTE — Transfer of Care (Signed)
Immediate Anesthesia Transfer of Care Note  Patient: [name]  Procedure(s) Performed: Procedure(s) with comments: RIGHT HIP HEMIARTHROPLASTY (Right) - DEPUY AML LATERAL WITH MARK II  Patient Location: PACU  Anesthesia Type:General  Level of Consciousness: awake, alert , oriented and patient cooperative  Airway & Oxygen Therapy: Patient Spontanous Breathing and Patient connected to face mask oxygen  Post-op Assessment: Report given to PACU RN, Post -op Vital signs reviewed and stable and Patient moving all extremities  Post vital signs: Reviewed and stable  Complications: No apparent anesthesia complications

## 2014-01-10 NOTE — Brief Op Note (Signed)
01/09/2014 - 01/10/2014  8:58 AM  PATIENT:  [name]  80 y.o. female  PRE-OPERATIVE DIAGNOSIS:  closed right hip fracture  POST-OPERATIVE DIAGNOSIS:  closed right hip fracture  PROCEDURE:  Procedure(s) with comments: RIGHT HIP HEMIARTHROPLASTY (Right) - DEPUY AML LATERAL WITH MARK II  SURGEON:  Surgeon(s) and Role:    * Johnn Hai, MD - Primary  PHYSICIAN ASSISTANT:   ASSISTANTS: [place]   ANESTHESIA:   general  EBL:  Total I/O In: 1000 [I.V.:1000] Out: 325 [Urine:125; Blood:200]  BLOOD ADMINISTERED:none  DRAINS: none   LOCAL MEDICATIONS USED:  MARCAINE     SPECIMEN:  No Specimen  DISPOSITION OF SPECIMEN:  N/A  COUNTS:  YES  TOURNIQUET:  * No tourniquets in log *  DICTATION: .Other Dictation: Dictation Number [id number]  PLAN OF CARE: Admit to inpatient   PATIENT DISPOSITION:  PACU - hemodynamically stable.   Delay start of Pharmacological VTE agent (>24hrs) due to surgical blood loss or risk of bleeding: no

## 2014-01-10 NOTE — Interval H&P Note (Signed)
History and Physical Interval Note:  01/10/2014 7:19 AM  [name]  has presented today for surgery, with the diagnosis of closed right hip fracture  The various methods of treatment have been discussed with the patient and family. After consideration of risks, benefits and other options for treatment, the patient has consented to  Procedure(s) with comments: RIGHT HIP HEMIARTHROPLASTY (Right) - DEPUY AML LATERAL WITH MARK II as a surgical intervention .  The patient's history has been reviewed, patient examined, no change in status, stable for surgery.  I have reviewed the patient's chart and labs.  Questions were answered to the patient's satisfaction.     Korinne Greenstein C

## 2014-01-10 NOTE — Anesthesia Preprocedure Evaluation (Addendum)
Anesthesia Evaluation  Patient identified by MRN, date of birth, ID band Patient awake    Reviewed: Allergy & Precautions, H&P , NPO status , Patient's Chart, lab work & pertinent test results, reviewed documented beta blocker date and time   History of Anesthesia Complications (+) PONV and history of anesthetic complications  Airway Mallampati: I TM Distance: >3 FB Neck ROM: full    Dental  (+) Edentulous Upper   Pulmonary neg pulmonary ROS, Current Smoker (+smoker's cough),  + rhonchi (occasional scattered)         Cardiovascular hypertension, On Home Beta Blockers + CAD, + Past MI (2004) and + Peripheral Vascular Disease Rhythm:regular Rate:Normal  Denies CP - hasnt used NTG in years   Neuro/Psych  Headaches, Anxiety CVA (h/o 2002), No Residual Symptoms    GI/Hepatic Neg liver ROS, hiatal hernia,   Endo/Other  negative endocrine ROS  Renal/GU negative Renal ROS  negative genitourinary   Musculoskeletal   Abdominal   Peds  Hematology negative hematology ROS (+)   Anesthesia Other Findings   Reproductive/Obstetrics negative OB ROS                          Anesthesia Physical Anesthesia Plan  ASA: III  Anesthesia Plan: General ETT   Post-op Pain Management:    Induction: Intravenous  Airway Management Planned: Oral ETT  Additional Equipment:   Intra-op Plan:   Post-operative Plan: Extubation in OR  Informed Consent: I have reviewed the patients History and Physical, chart, labs and discussed the procedure including the risks, benefits and alternatives for the proposed anesthesia with the patient or authorized representative who has indicated his/her understanding and acceptance.   Dental Advisory Given  Plan Discussed with: CRNA and Surgeon  Anesthesia Plan Comments:        Anesthesia Quick Evaluation

## 2014-01-10 NOTE — H&P (View-Only) (Signed)
Reason for Consult:Hip fracture Referring Physician: EDP  [name] is an 80 y.o. female.  HPI: Fell today. No LOC  Past Medical History  Diagnosis Date  . Carotid artery occlusion   . Stroke 2003  . Hiatal hernia   . Headache(784.0)   . Gallstones   . Common bile duct dilation   . Abdominal aortic atherosclerosis   . HLD (hyperlipidemia)   . CAD (coronary artery disease)   . Myocardial infarction   . PONV (postoperative nausea and vomiting)     Past Surgical History  Procedure Laterality Date  . Abdominal hysterectomy    . Rotator cuff repair Left   . Tonsillectomy    . Bladder surgery    . Appendectomy    . Ankle fracture surgery    . Cholecystectomy N/A 05/05/2013    Procedure: LAPAROSCOPIC CHOLECYSTECTOMY WITH INTRAOPERATIVE CHOLANGIOGRAM;  Surgeon: Armando Ramirez, MD;  Location: [facility];  Service: General;  Laterality: N/A;    Family History  Problem Relation Age of Onset  . Stroke Mother   . Breast cancer Maternal Aunt   . Prostate cancer Maternal Grandfather     Social History:  reports that she has been smoking Cigarettes.  She has been smoking about 0.00 packs per day. She has never used smokeless tobacco. She reports that she does not drink alcohol or use illicit drugs.  Allergies:  Allergies  Allergen Reactions  . Dextroamphetamine Sulfate Er Other (See Comments)    Euphoric feeling    Medications: I have reviewed the patient's current medications.  Results for orders placed during the hospital encounter of 01/09/14 (from the past 48 hour(s))  BASIC METABOLIC PANEL     Status: Abnormal   Collection Time    01/09/14 10:09 AM      Result Value Ref Range   Sodium 129 (*) 137 - 147 mEq/L   Potassium 5.0  3.7 - 5.3 mEq/L   Comment: MARKED HEMOLYSIS     HEMOLYSIS AT THIS LEVEL MAY AFFECT RESULT   Chloride 93 (*) 96 - 112 mEq/L   CO2 20  19 - 32 mEq/L   Glucose, Bld 133 (*) 70 - 99 mg/dL   BUN 22  6 - 23 mg/dL   Creatinine, Ser 0.94  0.50 -  1.10 mg/dL   Calcium 9.3  8.4 - 10.5 mg/dL   GFR calc non Af Amer 55 (*) >90 mL/min   GFR calc Af Amer 63 (*) >90 mL/min   Comment: (NOTE)     The eGFR has been calculated using the CKD EPI equation.     This calculation has not been validated in all clinical situations.     eGFR's persistently <90 mL/min signify possible Chronic Kidney     Disease.   Anion gap 16 (*) 5 - 15  CBC WITH DIFFERENTIAL     Status: Abnormal   Collection Time    01/09/14 10:09 AM      Result Value Ref Range   WBC 11.4 (*) 4.0 - 10.5 K/uL   RBC 4.30  3.87 - 5.11 MIL/uL   Hemoglobin 13.2  12.0 - 15.0 g/dL   HCT 38.2  36.0 - 46.0 %   MCV 88.8  78.0 - 100.0 fL   MCH 30.7  26.0 - 34.0 pg   MCHC 34.6  30.0 - 36.0 g/dL   RDW 13.4  11.5 - 15.5 %   Platelets 169  150 - 400 K/uL   Neutrophils Relative % 82 (*) 43 -   77 %   Neutro Abs 9.2 (*) 1.7 - 7.7 K/uL   Lymphocytes Relative 12  12 - 46 %   Lymphs Abs 1.4  0.7 - 4.0 K/uL   Monocytes Relative 6  3 - 12 %   Monocytes Absolute 0.7  0.1 - 1.0 K/uL   Eosinophils Relative 0  0 - 5 %   Eosinophils Absolute 0.0  0.0 - 0.7 K/uL   Basophils Relative 0  0 - 1 %   Basophils Absolute 0.0  0.0 - 0.1 K/uL  PROTIME-INR     Status: None   Collection Time    01/09/14 10:09 AM      Result Value Ref Range   Prothrombin Time 13.0  11.6 - 15.2 seconds   INR 0.98  0.00 - 1.49  TYPE AND SCREEN     Status: None   Collection Time    01/09/14 10:15 AM      Result Value Ref Range   ABO/RH(D) A NEG     Antibody Screen NEG     Sample Expiration 01/12/2014      Dg Hip Complete Right  01/09/2014   CLINICAL DATA:  RIGHT hip pain post fall  EXAM: RIGHT HIP - COMPLETE 2+ VIEW  COMPARISON:  None  FINDINGS: Osseous demineralization.  Displaced subcapital fracture RIGHT femoral neck.  BILATERAL narrowing of the hip joints.  Pelvis appears intact.  No dislocation or additional fracture identified.  IMPRESSION: Displaced subcapital fracture RIGHT femoral neck.   Electronically Signed    By: Lavonia Dana M.D.   On: 01/09/2014 11:02   Dg Chest Port 1 View  01/09/2014   CLINICAL DATA:  Hip fracture, fall, smoker, coronary artery disease post MI, preoperative evaluation  EXAM: PORTABLE CHEST - 1 VIEW  COMPARISON:  02/14/2013  FINDINGS: Upper normal heart size.  Atherosclerotic calcification aorta.  Slight pulmonary vascular congestion.  Mediastinal contours otherwise normal.  Lungs clear.  No pleural effusion or pneumothorax.  Bones demineralized.  IMPRESSION: No acute abnormalities.   Electronically Signed   By: Lavonia Dana M.D.   On: 01/09/2014 11:01    Review of Systems  Musculoskeletal: Positive for joint pain.  All other systems reviewed and are negative.  Blood pressure 140/78, pulse 74, temperature 98.8 F (37.1 C), temperature source Oral, resp. rate 19, SpO2 99.00%. Physical Exam  Constitutional: She is oriented to person, place, and time. She appears well-developed.  HENT:  Head: Normocephalic.  Eyes: Pupils are equal, round, and reactive to light.  Neck: Normal range of motion.  Cardiovascular: Normal rate.   Respiratory: Effort normal.  GI: Soft.  Musculoskeletal:  Short ER hip right. NVI.  Neurological: She is alert and oriented to person, place, and time.  Skin: Skin is warm and dry.  Psychiatric: She has a normal mood and affect.    Assessment/Plan: Displace right femoral neck fracture Plan hemiarthroplasty. Risks discussed.  Fallan Mccarey C 01/09/2014, 1:21 PM

## 2014-01-10 NOTE — Progress Notes (Signed)
   Subjective: Day of Surgery Right femur fracture Surgery schedule for later this morning  Pt resting in no acute distress    Objective:   VITALS:   Filed Vitals:   01/10/14 0521  BP: 188/61  Pulse: 66  Temp: 98.6 F (37 C)  Resp: 16    Right lower extremity with mild shortening and external rotation nv intact distally  LABS  Recent Labs  01/09/14 1009 01/10/14 0444  HGB 13.2 12.9  HCT 38.2 39.1  WBC 11.4* 10.4  PLT 169 152     Recent Labs  01/09/14 1009 01/10/14 0444  NA 129* 132*  K 5.0 4.2  BUN 22 16  CREATININE 0.94 0.85  GLUCOSE 133* 166*     Assessment/Plan: Day of Surgery  Surgery later today Discussed with family the procedure and expected recovery   Merla Riches, MPAS, PA-C  01/10/2014, 7:10 AM

## 2014-01-10 NOTE — Anesthesia Postprocedure Evaluation (Signed)
  Anesthesia Post-op Note  Anesthesia Post Note  Patient: [name]  Procedure(s) Performed: Procedure(s) (LRB): RIGHT HIP HEMIARTHROPLASTY (Right)  Anesthesia type: General  Patient location: PACU  Post pain: Pain level controlled  Post assessment: Post-op Vital signs reviewed  Last Vitals:  Filed Vitals:   01/10/14 1008  BP: 158/64  Pulse: 63  Temp: 36.9 C  Resp: 14    Post vital signs: Reviewed  Level of consciousness: sedated  Complications: No apparent anesthesia complications

## 2014-01-10 NOTE — Progress Notes (Signed)
[facility] PROGRESS NOTE   [name] VEL:[id number] DOB: 1928/10/17 DOA: 01/09/2014 PCP: Leonard Downing, MD  HPI/Subjective: Patient seen with family at bedside as well as nursing staff. Pain is more controlled, seen after her surgery. Questions answered  Assessment/Plan: Principal Problem:   Closed right hip fracture Active Problems:   TOBACCO ABUSE   CAROTID ARTERY OCCLUSION   CVA   Hypertension   Hip fracture requiring operative repair   Hyponatremia   Right hip fracture  -Had a right hip fracture resulted from with severe like mechanical fall.  -Right subcapital hip fracture, orthopedics consulted.  -Patient placed on hip fracture protocol, narcotics for pain management.  -Likely she will need skilled nursing facility post discharge.  -Restart heparin for DVT prophylaxis when it's okay by orthopedics  Hyponatremia  -Patient appears to be euvolemic, cannot rule out mild SIADH secondary to her smoking/probable COPD.  -Patient started on normal saline at 75 mL/hour, check BMP in a.m.   Hypertension  -Continue home medications.   Tobacco abuse  -Bronchodilators as needed.    Code Status: Full code Family Communication: Plan discussed with the patient. Disposition Plan: Remains inpatient   Consultants:  Dr. Tonita Cong  Procedures:  Right hip hemiarthroplasty done on 8/22 by Dr. Tonita Cong  Antibiotics:  Preoperative Ancef   Objective: Filed Vitals:   01/10/14 1210  BP: 172/57  Pulse: 57  Temp: 98.3 F (36.8 C)  Resp: 15    Intake/Output Summary (Last 24 hours) at 01/10/14 1249 Last data filed at 01/10/14 1113  Gross per 24 hour  Intake   3705 ml  Output   1200 ml  Net   2505 ml   Filed Weights   01/09/14 1531  Weight: 61.236 kg (135 lb)    Exam: General: Alert and awake, oriented x3, not in any acute distress. HEENT: anicteric sclera, pupils reactive to light and accommodation, EOMI CVS: S1-S2 clear, no murmur rubs or  gallops Chest: clear to auscultation bilaterally, no wheezing, rales or rhonchi Abdomen: soft nontender, nondistended, normal bowel sounds, no organomegaly Extremities: no cyanosis, clubbing or edema noted bilaterally Neuro: Cranial nerves II-XII intact, no focal neurological deficits  Data Reviewed: Basic Metabolic Panel:  Recent Labs Lab 01/09/14 1009 01/10/14 0444  NA 129* 132*  K 5.0 4.2  CL 93* 98  CO2 20 23  GLUCOSE 133* 166*  BUN 22 16  CREATININE 0.94 0.85  CALCIUM 9.3 8.7   Liver Function Tests: No results found for this basename: AST, ALT, ALKPHOS, BILITOT, PROT, ALBUMIN,  in the last 168 hours No results found for this basename: LIPASE, AMYLASE,  in the last 168 hours No results found for this basename: AMMONIA,  in the last 168 hours CBC:  Recent Labs Lab 01/09/14 1009 01/10/14 0444  WBC 11.4* 10.4  NEUTROABS 9.2*  --   HGB 13.2 12.9  HCT 38.2 39.1  MCV 88.8 91.4  PLT 169 152   Cardiac Enzymes: No results found for this basename: CKTOTAL, CKMB, CKMBINDEX, TROPONINI,  in the last 168 hours BNP (last 3 results) No results found for this basename: PROBNP,  in the last 8760 hours CBG: No results found for this basename: GLUCAP,  in the last 168 hours  Micro Recent Results (from the past 240 hour(s))  SURGICAL PCR SCREEN     Status: None   Collection Time    01/09/14  3:39 PM      Result Value Ref Range Status   MRSA, PCR NEGATIVE  NEGATIVE Final  Staphylococcus aureus NEGATIVE  NEGATIVE Final   Comment:            The Xpert SA Assay (FDA     approved for NASAL specimens     in patients over 31 years of age),     is one component of     a comprehensive surveillance     program.  Test performance has     been validated by [employer] for patients greater     than or equal to 23 year old.     It is not intended     to diagnose infection nor to     guide or monitor treatment.     Studies: Dg Hip Complete Right  01/09/2014   CLINICAL  DATA:  RIGHT hip pain post fall  EXAM: RIGHT HIP - COMPLETE 2+ VIEW  COMPARISON:  None  FINDINGS: Osseous demineralization.  Displaced subcapital fracture RIGHT femoral neck.  BILATERAL narrowing of the hip joints.  Pelvis appears intact.  No dislocation or additional fracture identified.  IMPRESSION: Displaced subcapital fracture RIGHT femoral neck.   Electronically Signed   By: Lavonia Dana M.D.   On: 01/09/2014 11:02   Dg Pelvis Portable  01/10/2014   CLINICAL DATA:  Postop right hip arthroplasty  EXAM: PORTABLE PELVIS 1-2 VIEWS  COMPARISON:  Preoperative radiographs 01/09/2014  FINDINGS: Interval right hip arthroplasty without evidence of hardware complication. Expected surgical changes including subcutaneous air in the soft tissues about the right greater trochanter. The visualized bony pelvis is intact. The visualized left femur is unremarkable.  IMPRESSION: Surgical changes of right total hip arthroplasty without evidence of immediate complication.   Electronically Signed   By: Jacqulynn Cadet M.D.   On: 01/10/2014 10:51   Dg Chest Port 1 View  01/09/2014   CLINICAL DATA:  Hip fracture, fall, smoker, coronary artery disease post MI, preoperative evaluation  EXAM: PORTABLE CHEST - 1 VIEW  COMPARISON:  02/14/2013  FINDINGS: Upper normal heart size.  Atherosclerotic calcification aorta.  Slight pulmonary vascular congestion.  Mediastinal contours otherwise normal.  Lungs clear.  No pleural effusion or pneumothorax.  Bones demineralized.  IMPRESSION: No acute abnormalities.   Electronically Signed   By: Lavonia Dana M.D.   On: 01/09/2014 11:01    Scheduled Meds: . acetaminophen  1,000 mg Intravenous Once  . albuterol  2.5 mg Nebulization BID  . antiseptic oral rinse  7 mL Mouth Rinse BID  . aspirin EC  81 mg Oral Daily  . dextrose 5 % and 0.45 % NaCl with KCl 20 mEq/L      . fentaNYL      . metoprolol succinate  100 mg Oral Daily  . multivitamin with minerals  1 tablet Oral QHS  . polyethylene  glycol  17 g Oral Daily  . polymyxin / bacitracin (DOUBLE ANTIBIOTIC) irrigation   Irrigation Once  . promethazine       Continuous Infusions: . sodium chloride 75 mL/hr at 01/10/14 0456  . dextrose 5 % and 0.45 % NaCl with KCl 20 mEq/L         Time spent: 35 minutes    [facility] A  [facility] Pager [phone] If 7PM-7AM, please contact night-coverage at [email or web address], password [facility] 01/10/2014, 12:49 PM  LOS: 1 day

## 2014-01-10 NOTE — Op Note (Signed)
NAMESHENIAH, SUPAK NO.:  [id number]  MEDICAL RECORD NO.:  [id number]  LOCATION:  7322                         FACILITY:  [facility]  PHYSICIAN:  Susa Day, M.D.    DATE OF BIRTH:  [birth year]  DATE OF PROCEDURE:  01/10/2014 DATE OF DISCHARGE:                              OPERATIVE REPORT   PREOPERATIVE DIAGNOSIS:  Right femoral neck fracture, displaced.  POSTOPERATIVE DIAGNOSIS:  Right femoral neck fracture, displaced.  PROCEDURE PERFORMED:  Right hip hemiarthroplasty.  COMPONENTS:  AML 13.5 and 48 bipolar +1.5 neck.  ANESTHESIA:  General.  ASSISTANT:  Merla Riches, who used to help for closure, exposure, holding of the extremity, dislocating the hip, and relocating it.  INDICATIONS:  85, displaced femoral neck indicated for hemiarthroplasty. Risk and benefits were discussed including bleeding, infection, damage to neurovascular structure, DVT, PE, anesthetic complications, etc.  TECHNIQUE:  With the patient in supine position after induction of adequate general anesthesia, 2 g Kefzol, placed in the left lateral decubitus position, all bony prominence well padded.  Hip holder utilized.  Right hip, leg, and peritrochanteric region was prepped and draped in usual sterile fashion.  Incision was centered upon the trochanter.  Subcutaneous tissue was dissected.  Electrocautery was utilized to achieve hemostasis.  Fascia lata identified and divided in line with skin incision.  Self-retaining retractor was then placed. Bursa was excised.  Adductor tenotomy performed, located the piriformis, tagged and reflected posteriorly, protected the sciatic nerve throughout the case.  Sciatic nerve was palpated.  Capsule was disrupted.  We performed T-shaped capsulotomy.  Fracture hematoma expressed.  Hip dislocated.  We tagged the leaflets of the capsule.  I used a Surveyor, minerals and oscillating saw and performed a femoral osteotomy, 1 fingerbreadth above the femoral  neck.  We then entered the canal with an initiator and a T-handle reamer  irrigating it and then it was sequentially reamed to 13.5 mm in diameter with good cortical contact, lateralizing it throughout.  We then broached to a 13.5.  Appropriate anteversion.  Excellent purchase.  Then removed that, removed the head, sized to a 48, inspected the acetabulum.  Good cartilage.  No bleeding. Irrigated.  We then placed a permanent AML in appropriate anteversion. Impacted into place, seating it on the osteotomy.  With excellent purchase.  We then trialed 1.5 neck, 48 bipolar assembly, reduced without difficulty.  Stable throughout a full range, dislocated.  Placed a permanent bipolar assembly upon the femoral neck, irrigated it, cleaned the trunnion, impacted on the femoral head and neck.  Reduced it and found to be stable throughout a full range.  Good flexion, internal rotation, full extension, with the knee in flexion.  Appropriate leg length.  Wound copiously irrigated.  No active bleeding.  I repaired the capsule with 1 Ethibond.  The adductor tenotomy with #1 Vicryl and the fascia lata with #1 Vicryl and oversewn with the V-lock.  The subcu with 2-0 and skin with staples. Wound was dressed sterilely.  Placed supine on the hospital bed, leg lengths equivalent, appropriate rotation.  Knee immobilizer placed, extubated and transported to the recovery room in satisfactory condition.  The patient tolerated the procedure well.  No complications.  Assistant, Merla Riches, [place].  Blood loss 100 mL.     Susa Day, M.D.     Geralynn Rile  D:  01/10/2014  T:  01/10/2014  Job:  [id number]

## 2014-01-11 DIAGNOSIS — I6529 Occlusion and stenosis of unspecified carotid artery: Secondary | ICD-10-CM

## 2014-01-11 DIAGNOSIS — K838 Other specified diseases of biliary tract: Secondary | ICD-10-CM

## 2014-01-11 LAB — BASIC METABOLIC PANEL
Anion gap: 10 (ref 5–15)
BUN: 15 mg/dL (ref 6–23)
CO2: 25 mEq/L (ref 19–32)
CREATININE: 0.96 mg/dL (ref 0.50–1.10)
Calcium: 8.7 mg/dL (ref 8.4–10.5)
Chloride: 99 mEq/L (ref 96–112)
GFR calc Af Amer: 62 mL/min — ABNORMAL LOW (ref >90)
GFR, EST NON AFRICAN AMERICAN: 53 mL/min — AB (ref >90)
GLUCOSE: 139 mg/dL — AB (ref 70–99)
Potassium: 4.1 mEq/L (ref 3.7–5.3)
Sodium: 134 mEq/L — ABNORMAL LOW (ref 137–147)

## 2014-01-11 LAB — CBC
HEMATOCRIT: 31.8 % — AB (ref 36.0–46.0)
HEMOGLOBIN: 10.8 g/dL — AB (ref 12.0–15.0)
MCH: 30.9 pg (ref 26.0–34.0)
MCHC: 34 g/dL (ref 30.0–36.0)
MCV: 90.9 fL (ref 78.0–100.0)
Platelets: 139 10*3/uL — ABNORMAL LOW (ref 150–400)
RBC: 3.5 MIL/uL — ABNORMAL LOW (ref 3.87–5.11)
RDW: 13.4 % (ref 11.5–15.5)
WBC: 8.7 10*3/uL (ref 4.0–10.5)

## 2014-01-11 MED ORDER — ALBUTEROL SULFATE (2.5 MG/3ML) 0.083% IN NEBU: 2.5000 mg | INHALATION_SOLUTION | Freq: Four times a day (QID) | RESPIRATORY_TRACT | Status: DC | PRN

## 2014-01-11 MED ORDER — GUAIFENESIN ER 600 MG PO TB12
1200.0000 mg | ORAL_TABLET | Freq: Two times a day (BID) | ORAL | Status: DC
  Administered 2014-01-11 – 2014-01-13 (×4): 1200 mg via ORAL
  Filled 2014-01-11 (×6): qty 2

## 2014-01-11 NOTE — Progress Notes (Signed)
[facility] PROGRESS NOTE   [name] OJJ:[id number] DOB: 03-27-1929 DOA: 01/09/2014 PCP: Leonard Downing, MD  HPI/Subjective: Pain is controlled, seen by ortho this morning and recommended PT/OT. Patient denies any significant complaints.  Assessment/Plan: Principal Problem:   Closed right hip fracture Active Problems:   TOBACCO ABUSE   CAROTID ARTERY OCCLUSION   CVA   Hypertension   Hip fracture requiring operative repair   Hyponatremia   Right hip fracture  -Had a right hip fracture resulted from with severe like mechanical fall.  -Right subcapital hip fracture, orthopedics consulted.  -Patient placed on hip fracture protocol, narcotics for pain management.  -Likely she will need skilled nursing facility post discharge.  -Started on Lovenox for DVT prophylaxis.  Hyponatremia  -Patient appears to be euvolemic, cannot rule out mild SIADH secondary to her smoking/probable COPD.  -This is improved, discontinue IV fluids, sodium today is 134.  Hypertension  -Continue home medications.   Tobacco abuse  -Bronchodilators as needed. Mucinex added.    Code Status: Full code Family Communication: Plan discussed with the patient. Disposition Plan: Remains inpatient   Consultants:  Dr. Tonita Cong  Procedures:  Right hip hemiarthroplasty done on 8/22 by Dr. Tonita Cong  Antibiotics:  Preoperative Ancef   Objective: Filed Vitals:   01/11/14 1024  BP: 156/56  Pulse: 69  Temp: 99 F (37.2 C)  Resp: 16    Intake/Output Summary (Last 24 hours) at 01/11/14 1140 Last data filed at 01/11/14 1027  Gross per 24 hour  Intake   2483 ml  Output   3825 ml  Net  -1342 ml   Filed Weights   01/09/14 1531  Weight: 61.236 kg (135 lb)    Exam: General: Alert and awake, oriented x3, not in any acute distress. HEENT: anicteric sclera, pupils reactive to light and accommodation, EOMI CVS: S1-S2 clear, no murmur rubs or gallops Chest: clear to auscultation  bilaterally, no wheezing, rales or rhonchi Abdomen: soft nontender, nondistended, normal bowel sounds, no organomegaly Extremities: no cyanosis, clubbing or edema noted bilaterally Neuro: Cranial nerves II-XII intact, no focal neurological deficits  Data Reviewed: Basic Metabolic Panel:  Recent Labs Lab 01/09/14 1009 01/10/14 0444 01/11/14 0440  NA 129* 132* 134*  K 5.0 4.2 4.1  CL 93* 98 99  CO2 20 23 25   GLUCOSE 133* 166* 139*  BUN 22 16 15   CREATININE 0.94 0.85 0.96  CALCIUM 9.3 8.7 8.7   Liver Function Tests: No results found for this basename: AST, ALT, ALKPHOS, BILITOT, PROT, ALBUMIN,  in the last 168 hours No results found for this basename: LIPASE, AMYLASE,  in the last 168 hours No results found for this basename: AMMONIA,  in the last 168 hours CBC:  Recent Labs Lab 01/09/14 1009 01/10/14 0444 01/11/14 0440  WBC 11.4* 10.4 8.7  NEUTROABS 9.2*  --   --   HGB 13.2 12.9 10.8*  HCT 38.2 39.1 31.8*  MCV 88.8 91.4 90.9  PLT 169 152 139*   Cardiac Enzymes: No results found for this basename: CKTOTAL, CKMB, CKMBINDEX, TROPONINI,  in the last 168 hours BNP (last 3 results) No results found for this basename: PROBNP,  in the last 8760 hours CBG: No results found for this basename: GLUCAP,  in the last 168 hours  Micro Recent Results (from the past 240 hour(s))  SURGICAL PCR SCREEN     Status: None   Collection Time    01/09/14  3:39 PM      Result Value Ref Range  Status   MRSA, PCR NEGATIVE  NEGATIVE Final   Staphylococcus aureus NEGATIVE  NEGATIVE Final   Comment:            The Xpert SA Assay (FDA     approved for NASAL specimens     in patients over 46 years of age),     is one component of     a comprehensive surveillance     program.  Test performance has     been validated by [employer] for patients greater     than or equal to 30 year old.     It is not intended     to diagnose infection nor to     guide or monitor treatment.      Studies: Dg Pelvis Portable  01/10/2014   CLINICAL DATA:  Postop right hip arthroplasty  EXAM: PORTABLE PELVIS 1-2 VIEWS  COMPARISON:  Preoperative radiographs 01/09/2014  FINDINGS: Interval right hip arthroplasty without evidence of hardware complication. Expected surgical changes including subcutaneous air in the soft tissues about the right greater trochanter. The visualized bony pelvis is intact. The visualized left femur is unremarkable.  IMPRESSION: Surgical changes of right total hip arthroplasty without evidence of immediate complication.   Electronically Signed   By: Jacqulynn Cadet M.D.   On: 01/10/2014 10:51    Scheduled Meds: . albuterol  2.5 mg Nebulization BID  . antiseptic oral rinse  7 mL Mouth Rinse BID  . enoxaparin (LOVENOX) injection  30 mg Subcutaneous Q24H  . metoprolol succinate  100 mg Oral Daily  . multivitamin with minerals  1 tablet Oral QHS  . polyethylene glycol  17 g Oral Daily   Continuous Infusions:       Time spent: 35 minutes    [facility]  [facility] Pager [phone] If 7PM-7AM, please contact night-coverage at [email or web address], password [facility] 01/11/2014, 11:40 AM  LOS: 2 days

## 2014-01-11 NOTE — Evaluation (Signed)
Physical Therapy Evaluation Patient Details Name: [name] MRN: [id number] DOB: 1929-03-27 Today's Date: 01/11/2014   History of Present Illness  80 yo remale  admitted8/21/ after fall/hip fracture  on right. S/P Hip hemi arthroplasty on 01/10/14.  Clinical Impression  Pt did ambulate x 25'. Pt will benefit from PT to address problems listed in note below.    Follow Up Recommendations SNF    Equipment Recommendations  Rolling walker with 5" wheels    Recommendations for Other Services       Precautions / Restrictions Precautions Precautions: Posterior Hip Restrictions RLE Weight Bearing: Weight bearing as tolerated      Mobility  Bed Mobility Overal bed mobility: Needs Assistance Bed Mobility: Supine to Sit     Supine to sit: Mod assist     General bed mobility comments: multimodal cues for hand  position, technique and posterior hip precautions, used trapeze for initial mobility  Transfers Overall transfer level: Needs assistance Equipment used: Rolling walker (2 wheeled) Transfers: Sit to/from Stand Sit to Stand: Min assist;+2 safety/equipment;From elevated surface            Ambulation/Gait Ambulation/Gait assistance: Min assist;+2 safety/equipment Ambulation Distance (Feet): 20 Feet Assistive device: Rolling walker (2 wheeled) Gait Pattern/deviations: Step-to pattern;Antalgic;Decreased stance time - right     General Gait Details: cues for equence and posture.  Stairs            Wheelchair Mobility    Modified Rankin (Stroke Patients Only)       Balance                                             Pertinent Vitals/Pain Pain Assessment: 0-10 Pain Score: 5  Pain Location: R hip Pain Intervention(s): Limited activity within patient's tolerance;Monitored during session;Premedicated before session;Ice applied;Repositioned    Home Living Family/patient expects to be discharged to:: Skilled nursing  facility Living Arrangements: Alone                    Prior Function                 Hand Dominance        Extremity/Trunk Assessment   Upper Extremity Assessment: Overall WFL for tasks assessed           Lower Extremity Assessment: RLE deficits/detail RLE Deficits / Details: support of leg for bed mobility       Communication      Cognition Arousal/Alertness: Awake/alert Behavior During Therapy: WFL for tasks assessed/performed Overall Cognitive Status: Within Functional Limits for tasks assessed                      General Comments      Exercises        Assessment/Plan    PT Assessment Patient needs continued PT services  PT Diagnosis Difficulty walking;Acute pain   PT Problem List Decreased strength;Decreased range of motion;Decreased activity tolerance;Decreased mobility;Decreased knowledge of precautions;Decreased knowledge of use of DME;Decreased safety awareness;Pain  PT Treatment Interventions DME instruction;Gait training;Functional mobility training;Therapeutic activities;Therapeutic exercise;Patient/family education   PT Goals (Current goals can be found in the Care Plan section) Acute Rehab PT Goals Patient Stated Goal: to go home PT Goal Formulation: With patient Time For Goal Achievement: 01/25/14 Potential to Achieve Goals: Good    Frequency Min 3X/week   Barriers to  discharge Decreased caregiver support      Co-evaluation               End of Session Equipment Utilized During Treatment: Gait belt Activity Tolerance: Patient tolerated treatment well Patient left: in chair;with call bell/phone within reach Nurse Communication: Mobility status         Time: 1030-1055 PT Time Calculation (min): 25 min   Charges:   PT Evaluation $Initial PT Evaluation Tier I: 1 Procedure PT Treatments $Gait Training: 23-37 mins   PT G Codes:          [name] 01/11/2014, 2:40 PM Tresa Endo  PT [phone]

## 2014-01-11 NOTE — Progress Notes (Signed)
Clinical Social Work Department CLINICAL SOCIAL WORK PLACEMENT NOTE 01/11/2014  Patient:  [name], [name]  Account Number:  [id number] [place] date:  01/09/2014  Clinical Social Worker:  Sindy Messing, LCSW  Date/time:  01/11/2014 09:30 AM  Clinical Social Work is seeking post-discharge placement for this patient at the following level of care:   [place]   (*CSW will update this form in Epic as items are completed)   01/11/2014  Patient/family provided with [place] Department of Clinical Social Work's list of facilities offering this level of care within the geographic area requested by the patient (or if unable, by the patient's family).  01/11/2014  Patient/family informed of their freedom to choose among providers that offer the needed level of care, that participate in Medicare, Medicaid or managed care program needed by the patient, have an available bed and are willing to accept the patient.  01/11/2014  Patient/family informed of [facility]' ownership interest in [facility], as well as of the fact that they are under no obligation to receive care at this facility.  PASARR submitted to EDS on 01/11/2014 PASARR number received on 01/11/2014  FL2 transmitted to all facilities in geographic area requested by pt/family on  01/11/2014 FL2 transmitted to all facilities within larger geographic area on   Patient informed that his/her managed care company has contracts with or will negotiate with  certain facilities, including the following:     Patient/family informed of bed offers received:   Patient chooses bed at  Physician recommends and patient chooses bed at    Patient to be transferred to  on   Patient to be transferred to facility by  Patient and family notified of transfer on  Name of family member notified:    The following physician request were entered in Epic:   Additional Comments:

## 2014-01-11 NOTE — Progress Notes (Signed)
   Subjective: 1 Day Post-Op Procedure(s) (LRB): RIGHT HIP HEMIARTHROPLASTY (Right)  Pt doing well Mild pain in right hip today Ready for therapy and bend the knee Patient reports pain as mild.  Objective:   VITALS:   Filed Vitals:   01/11/14 0515  BP: 161/72  Pulse: 70  Temp: 99.4 F (37.4 C)  Resp: 16    Right hip incision healing well nv intact distally No rashes or edema Knee immobilizer in place  LABS  Recent Labs  01/09/14 1009 01/10/14 0444 01/11/14 0440  HGB 13.2 12.9 10.8*  HCT 38.2 39.1 31.8*  WBC 11.4* 10.4 8.7  PLT 169 152 139*     Recent Labs  01/09/14 1009 01/10/14 0444 01/11/14 0440  NA 129* 132* 134*  K 5.0 4.2 4.1  BUN 22 16 15   CREATININE 0.94 0.85 0.96  GLUCOSE 133* 166* 139*     Assessment/Plan: 1 Day Post-Op Procedure(s) (LRB): RIGHT HIP HEMIARTHROPLASTY (Right)  Pt doing well PT/Ot Weight bearing as tolerated Pain control as needed    Merla Riches, MPAS, PA-C  01/11/2014, 8:25 AM

## 2014-01-11 NOTE — Progress Notes (Signed)
Clinical Social Work Department BRIEF PSYCHOSOCIAL ASSESSMENT 01/11/2014  Patient:  [name], [name]     Account Number:  [id number]     [place] date:  01/09/2014  Clinical Social Worker:  Earlie Server  Date/Time:  01/11/2014 09:15 AM  Referred by:  Physician  Date Referred:  01/11/2014 Referred for  SNF Placement   Other Referral:   Interview type:  Patient Other interview type:    PSYCHOSOCIAL DATA Living Status:  ALONE Admitted from facility:   Level of care:   Primary support name:  Joelene Millin Primary support relationship to patient:  CHILD, ADULT Degree of support available:   Adequate    CURRENT CONCERNS Current Concerns  Post-Acute Placement   Other Concerns:    SOCIAL WORK ASSESSMENT / PLAN CSW received referral in order to assist with SNF placement. CSW reviewed chart and met with patient at bedside. CSW introduced myself and explained role.    Patient reports she lives at home alone and fell prior to admission. Patient reports that she was independent prior to fall and that family assisted as needed. CSW spoke with patient about her plans at DC. Patient reports she wants to go home if possible but has not worked with PT yet. CSW explained that often times with hip surgeries, SNF placement is needed. CSW explained process along with possible copayments with insurance. Patient has visited others at SNFs but has never been a patient at a SNF. Patient agreeable to [facility] search but reports if she works well with PT she wants to consider going home.    CSW completed FL2 and faxed out. CSW will follow up after PT evaluates and provide bed offers if needed.   Assessment/plan status:  Psychosocial Support/Ongoing Assessment of Needs Other assessment/ plan:   Information/referral to community resources:   SNF information    PATIENT'S/FAMILY'S RESPONSE TO PLAN OF CARE: Patient alert and oriented. Patient is worried about her independence and feels she would be  most comfortable at home. Patient aware of her safety and understanding that ST SNF might be warranted prior to returning home alone. Patient engaged and thanked CSW for information.       Sindy Messing, LCSW (Weekend Coverage)

## 2014-01-11 NOTE — Progress Notes (Signed)
CARE MANAGEMENT NOTE 01/11/2014  Patient:  DAILYNN, [name]   Account Number:  [id number]  Date Initiated:  01/10/2014  Documentation initiated by:  [facility]  Subjective/Objective Assessment:     Action/Plan:   Anticipated DC Date:  01/12/2014   Anticipated DC Plan:  [place]  In-house referral  Clinical Social Worker      DC Planning Services  CM consult      [facility] Choice  HOME HEALTH   Choice offered to / List presented to:  C-1 Patient   DME arranged  Vassie Moselle      DME agency  [place] arranged  [place].   Status of service:  Completed, signed off Medicare Important Message given?  YES (If response is "NO", the following Medicare IM given date fields will be blank) Date Medicare IM given:  01/11/2014 Medicare IM given by:  [facility] Date Additional Medicare IM given:   Additional Medicare IM given by:    Discharge Disposition:  [place]  Per UR Regulation:    If discussed at Long Length of Stay Meetings, dates discussed:    Comments:  01/11/2014 1230 NCM spoke to pt and offered choice for [facility]. Pt agreeable to [facility] for [facility] if dc home. Requesting RW for home. States her dtr will be able to stay with her in the evening hours. or another family member. Requesting RW for home. Notified [facility] for DME and RW needed for home. Jonnie Finner RN CCM Case Mgmt phone [phone]

## 2014-01-12 ENCOUNTER — Encounter (HOSPITAL_COMMUNITY): Payer: Self-pay | Admitting: Specialist

## 2014-01-12 DIAGNOSIS — S72143A Displaced intertrochanteric fracture of unspecified femur, initial encounter for closed fracture: Secondary | ICD-10-CM

## 2014-01-12 LAB — BASIC METABOLIC PANEL
Anion gap: 11 (ref 5–15)
BUN: 15 mg/dL (ref 6–23)
CALCIUM: 8.8 mg/dL (ref 8.4–10.5)
CO2: 24 meq/L (ref 19–32)
CREATININE: 0.88 mg/dL (ref 0.50–1.10)
Chloride: 99 mEq/L (ref 96–112)
GFR calc Af Amer: 69 mL/min — ABNORMAL LOW (ref >90)
GFR, EST NON AFRICAN AMERICAN: 59 mL/min — AB (ref >90)
GLUCOSE: 112 mg/dL — AB (ref 70–99)
Potassium: 4.5 mEq/L (ref 3.7–5.3)
Sodium: 134 mEq/L — ABNORMAL LOW (ref 137–147)

## 2014-01-12 LAB — CBC
HCT: 31.1 % — ABNORMAL LOW (ref 36.0–46.0)
Hemoglobin: 10.7 g/dL — ABNORMAL LOW (ref 12.0–15.0)
MCH: 31.8 pg (ref 26.0–34.0)
MCHC: 34.4 g/dL (ref 30.0–36.0)
MCV: 92.6 fL (ref 78.0–100.0)
Platelets: 132 10*3/uL — ABNORMAL LOW (ref 150–400)
RBC: 3.36 MIL/uL — ABNORMAL LOW (ref 3.87–5.11)
RDW: 13.7 % (ref 11.5–15.5)
WBC: 9 10*3/uL (ref 4.0–10.5)

## 2014-01-12 MED ORDER — LISINOPRIL 5 MG PO TABS
5.0000 mg | ORAL_TABLET | Freq: Every day | ORAL | Status: DC
  Administered 2014-01-12 – 2014-01-13 (×2): 5 mg via ORAL
  Filled 2014-01-12 (×2): qty 1

## 2014-01-12 MED ORDER — FUROSEMIDE 10 MG/ML IJ SOLN
20.0000 mg | Freq: Once | INTRAMUSCULAR | Status: AC
  Administered 2014-01-12: 20 mg via INTRAVENOUS
  Filled 2014-01-12: qty 2

## 2014-01-12 NOTE — Progress Notes (Signed)
CSW assisting with d/c planning. Spoke to pt's daughter to confirm d/c plan. Pt will retutn home. Family will provided 24/7 supervision at home. RNCM will assist with d/c planning .  Werner Lean LCSW [phone]

## 2014-01-12 NOTE — Evaluation (Signed)
Occupational Therapy Evaluation Patient Details Name: [name] MRN: [id number] DOB: [birth year] Today's Date: 01/12/2014    History of Present Illness 80 yo remale  admitted8/21/ after fall/hip fracture  on right. S/P Hip hemi arthroplasty on 01/10/14.   Clinical Impression   Pt presents to OT with decreased I with ADL activity s/p fall with hip fracture . Pt will benefit from skilled OT to increase I with ADL activity due to problems listed below.     Follow Up Recommendations  Home health OT;Supervision/Assistance - 24 hour    Equipment Recommendations  3 in 1 bedside comode       Precautions / Restrictions Precautions Precautions: Posterior Hip Restrictions RLE Weight Bearing: Partial weight bearing      Mobility Bed Mobility               General bed mobility comments: pt in chair  Transfers Overall transfer level: Needs assistance Equipment used: Rolling walker (2 wheeled)   Sit to Stand: Min assist;From elevated surface                   ADL Overall ADL's : Needs assistance/impaired Eating/Feeding: Set up;Sitting   Grooming: Set up;Sitting   Upper Body Bathing: Set up;Sitting   Lower Body Bathing: Maximal assistance;Sit to/from stand   Upper Body Dressing : Set up;Sitting   Lower Body Dressing: Maximal assistance;Sit to/from stand   Toilet Transfer: Moderate assistance;BSC   Toileting- Clothing Manipulation and Hygiene: Minimal assistance;Sit to/from stand         General ADL Comments: educated pt on hip precautions with ADL activity. Pt will have family obtain AE for LB dressing               Pertinent Vitals/Pain Pain Score: 3  Pain Location: r hip Pain Descriptors / Indicators: Aching Pain Intervention(s): Repositioned     Hand Dominance     Extremity/Trunk Assessment Upper Extremity Assessment Upper Extremity Assessment: Overall WFL for tasks assessed           Communication Communication Communication: No  difficulties   Cognition Arousal/Alertness: Awake/alert Behavior During Therapy: WFL for tasks assessed/performed Overall Cognitive Status: Within Functional Limits for tasks assessed                     General Comments   family will provide 24/7 A            Home Living Family/patient expects to be discharged to:: Private residence Living Arrangements: Alone Available Help at Discharge: Family Type of Home: House Home Access: Stairs to enter Technical brewer of Steps: 3   Home Layout: One level     Bathroom Shower/Tub: Teacher, early years/pre: Standard     Home Equipment: None          Prior Functioning/Environment Level of Independence: Independent             OT Diagnosis: Generalized weakness   OT Problem List: Decreased strength;Decreased knowledge of use of DME or AE;Decreased knowledge of precautions   OT Treatment/Interventions: Self-care/ADL training;DME and/or AE instruction;Patient/family education    OT Goals(Current goals can be found in the care plan section) Acute Rehab OT Goals Patient Stated Goal: to go home OT Goal Formulation: With patient Time For Goal Achievement: 01/19/14 Potential to Achieve Goals: Good  OT Frequency: Min 2X/week              End of Session Nurse Communication: Mobility status  Activity Tolerance:  Patient tolerated treatment well Patient left: in chair;with call bell/phone within reach   Time: 1135-1155 OT Time Calculation (min): 20 min Charges:  OT General Charges $OT Visit: 1 Procedure OT Evaluation $Initial OT Evaluation Tier I: 1 Procedure OT Treatments $Self Care/Home Management : 8-22 mins G-Codes:    Betsy Pries 01-21-14, 12:40 PM

## 2014-01-12 NOTE — Progress Notes (Signed)
Physical Therapy Treatment Patient Details Name: [name] MRN: [id number] DOB: Jun 15, 1928 Today's Date: 01/12/2014    History of Present Illness 80 yo remale  admitted8/21/ after fall/hip fracture  on right. S/P Hip hemi arthroplasty on 01/10/14.    PT Comments    POD # 2 assisted pt OOB to BR then to recliner.  Pt required increased time and Min Assist.  Educated on THP and 50% WB.  Pt now plans to D/C to home with daughter [name] to provide assist.    Follow Up Recommendations  Home health PT;Supervision/Assistance - 24 hour (pt now plans to D/C to home with daughter [name].  Will update LPT as eval rec SNF)     Equipment Recommendations  Rolling walker with 5" wheels (youth)    Recommendations for Other Services       Precautions / Restrictions Precautions Precautions: Posterior Hip Precaution Comments: Pt educated on THP and 50% WB handout in room Restrictions Weight Bearing Restrictions: Yes RLE Weight Bearing: Partial weight bearing RLE Partial Weight Bearing Percentage or Pounds: 50% per ortho MD PN from today    Mobility  Bed Mobility Overal bed mobility: Needs Assistance Bed Mobility: Supine to Sit     Supine to sit: Mod assist     General bed mobility comments: increased time and asssit with R LE  Transfers Overall transfer level: Needs assistance Equipment used: Rolling walker (2 wheeled) Transfers: Sit to/from Stand Sit to Stand: Min assist;From elevated surface         General transfer comment: increased time and 50% VC's on proper tech to comply with THP  Ambulation/Gait Ambulation/Gait assistance: Min assist Ambulation Distance (Feet): 22 Feet (11 feet x 2 to and from BR) Assistive device: Rolling walker (2 wheeled) Gait Pattern/deviations: Step-to pattern;Decreased stance time - right;Trunk flexed Gait velocity: decreased   General Gait Details: cues for equence and posture.  Amb distance limited by pain.   Stairs             Wheelchair Mobility    Modified Rankin (Stroke Patients Only)       Balance                                    Cognition Arousal/Alertness: Awake/alert Behavior During Therapy: WFL for tasks assessed/performed Overall Cognitive Status: Within Functional Limits for tasks assessed                      Exercises   Total Hip Replacement TE's 10 reps ankle pumps 10 reps knee presses 10 reps heel slides 10 reps SAQ's 10 reps ABD Followed by ICE     General Comments        Pertinent Vitals/Pain Pain Score: 3  Pain Location: r hip Pain Descriptors / Indicators: Aching Pain Intervention(s): Repositioned    Home Living Family/patient expects to be discharged to:: Private residence Living Arrangements: Alone Available Help at Discharge: Family Type of Home: House Home Access: Stairs to enter   Home Layout: One level Home Equipment: None      Prior Function Level of Independence: Independent          PT Goals (current goals can now be found in the care plan section) Acute Rehab PT Goals Patient Stated Goal: to go home Progress towards PT goals: Progressing toward goals    Frequency  Min 3X/week    PT Plan  Co-evaluation             End of Session Equipment Utilized During Treatment: Gait belt Activity Tolerance: Patient tolerated treatment well Patient left: in chair;with call bell/phone within reach     Time: 1005-1045 PT Time Calculation (min): 40 min  Charges:  $Gait Training: 8-22 mins $Therapeutic Activity: 8-22 mins $Self Care/Home Management: 8-22                    G Codes:      Rica Koyanagi  PTA [facility]  Acute  Rehab Pager      [phone]

## 2014-01-12 NOTE — Progress Notes (Signed)
Subjective: 2 Days Post-Op Procedure(s) (LRB): RIGHT HIP HEMIARTHROPLASTY (Right) Patient reports pain as mild.    Objective: Vital signs in last 24 hours: Temp:  [99 F (37.2 C)-99.7 F (37.6 C)] 99.1 F (37.3 C) (08/24 0340) Pulse Rate:  [67-73] 73 (08/24 0340) Resp:  [16] 16 (08/24 0340) BP: (156-184)/(51-59) 164/59 mmHg (08/24 0340) SpO2:  [90 %-100 %] 92 % (08/24 0340)  Intake/Output from previous day: 08/23 0701 - 08/24 0700 In: 600 [P.O.:600] Out: 3200 [Urine:3200] Intake/Output this shift:     Recent Labs  01/09/14 1009 01/10/14 0444 01/11/14 0440 01/12/14 0434  HGB 13.2 12.9 10.8* 10.7*    Recent Labs  01/11/14 0440 01/12/14 0434  WBC 8.7 9.0  RBC 3.50* 3.36*  HCT 31.8* 31.1*  PLT 139* 132*    Recent Labs  01/11/14 0440 01/12/14 0434  NA 134* 134*  K 4.1 4.5  CL 99 99  CO2 25 24  BUN 15 15  CREATININE 0.96 0.88  GLUCOSE 139* 112*  CALCIUM 8.7 8.8    Recent Labs  01/09/14 1009  INR 0.98    Neurologically intact ABD soft Neurovascular intact Sensation intact distally Intact pulses distally Dorsiflexion/Plantar flexion intact Incision: dressing C/D/I and no drainage No cellulitis present Compartment soft no calf pain or sign of DVT  Assessment/Plan: 2 Days Post-Op Procedure(s) (LRB): RIGHT HIP HEMIARTHROPLASTY (Right) Advance diet Up with therapy D/C IV fluids Continue Lovenox for DVT ppx PWB RLE (50%) D/C when stable/ready per medical service Will discuss with Dr. Mliss Fritz, [place]. 01/12/2014, 7:48 AM

## 2014-01-12 NOTE — Progress Notes (Signed)
CARE MANAGEMENT NOTE 01/12/2014  Patient:  [name], [name]   Account Number:  [id number]  Date Initiated:  01/10/2014  Documentation initiated by:  [facility]  Subjective/Objective Assessment:     Action/Plan:   Anticipated DC Date:  01/15/2014   Anticipated DC Plan:  [place]  In-house referral  Clinical Social Worker      DC Planning Services  CM consult      [facility] Choice  HOME HEALTH   Choice offered to / List presented to:  C-1 Patient   DME arranged  Vassie Moselle      DME agency  [place] arranged  [place].   Status of service:  Completed, signed off Medicare Important Message given?  YES (If response is "NO", the following Medicare IM given date fields will be blank) Date Medicare IM given:  01/11/2014 Medicare IM given by:  [facility] Date Additional Medicare IM given:   Additional Medicare IM given by:    Discharge Disposition:  [place]  Per UR Regulation:  Reviewed for med. necessity/level of care/duration of stay  If discussed at [place] of Stay Meetings, dates discussed:    Comments:  [id number]/Rhonda Davis,RN,BSN,CCM: Antihypretensive meds being restarted and adjusted. DC plan [phone] [place] daughter [phone]/ will take patient home when dcd/ patient is going to [place] in [place] to stay with dtg and her sig. Other/Rhonda Davis,Rn,BSN,CCM  01/11/2014 1230 NCM spoke to pt and offered choice for [facility]. Pt agreeable to [facility] for [facility] if dc home. Requesting RW for home. States her dtr will be able to stay with her in the evening hours. or another family member. Requesting RW for home. Notified [facility] for DME and RW needed for home. Jonnie Finner RN CCM Case Mgmt phone [phone]

## 2014-01-12 NOTE — Progress Notes (Signed)
[facility] PROGRESS NOTE   [name] LFY:[id number] DOB: [birth year] DOA: 01/09/2014 PCP: Leonard Downing, MD  HPI/Subjective: Pain is controlled, seen by ortho this morning and recommended PT/OT. Blood pressure is in the high side, restarted on her home medication but still high.  Assessment/Plan: Principal Problem:   Closed right hip fracture Active Problems:   TOBACCO ABUSE   CAROTID ARTERY OCCLUSION   CVA   Hypertension   Hip fracture requiring operative repair   Hyponatremia   Right hip fracture  -Had a right hip fracture resulted from with severe like mechanical fall.  -Right subcapital hip fracture, orthopedics consulted.  -Patient placed on hip fracture protocol, narcotics for pain management.  -Likely she will need skilled nursing facility post discharge.  -Started on Lovenox for DVT prophylaxis.  Hyponatremia  -Patient appears to be euvolemic, cannot rule out mild SIADH secondary to her smoking/probable COPD.  -This is improved, discontinue IV fluids, sodium today is 134.  Hypertension  -Restarted back on her Toprol-XL 100 mg daily, blood pressure still high. -Will add lisinopril.  Tobacco abuse  -Bronchodilators as needed. Mucinex added.    Code Status: Full code Family Communication: Plan discussed with the patient. Disposition Plan: Remains inpatient   Consultants:  Dr. Marlou Sa  Procedures:  Right hip hemiarthroplasty done on 8/22 by Dr. Marlou Sa  Antibiotics:  Preoperative Ancef   Objective: Filed Vitals:   01/12/14 1202  BP: 176/60  Pulse: 68  Temp:   Resp:     Intake/Output Summary (Last 24 hours) at 01/12/14 1226 Last data filed at 01/12/14 1032  Gross per 24 hour  Intake    600 ml  Output   3400 ml  Net  -2800 ml   Filed Weights   01/09/14 1531  Weight: 61.236 kg (135 lb)    Exam: General: Alert and awake, oriented x3, not in any acute distress. HEENT: anicteric sclera, pupils reactive to light and  accommodation, EOMI CVS: S1-S2 clear, no murmur rubs or gallops Chest: clear to auscultation bilaterally, no wheezing, rales or rhonchi Abdomen: soft nontender, nondistended, normal bowel sounds, no organomegaly Extremities: no cyanosis, clubbing or edema noted bilaterally Neuro: Cranial nerves II-XII intact, no focal neurological deficits  Data Reviewed: Basic Metabolic Panel:  Recent Labs Lab 01/09/14 1009 01/10/14 0444 01/11/14 0440 01/12/14 0434  NA 129* 132* 134* 134*  K 5.0 4.2 4.1 4.5  CL 93* 98 99 99  CO2 20 23 25 24   GLUCOSE 133* 166* 139* 112*  BUN 22 16 15 15   CREATININE 0.94 0.85 0.96 0.88  CALCIUM 9.3 8.7 8.7 8.8   Liver Function Tests: No results found for this basename: AST, ALT, ALKPHOS, BILITOT, PROT, ALBUMIN,  in the last 168 hours No results found for this basename: LIPASE, AMYLASE,  in the last 168 hours No results found for this basename: AMMONIA,  in the last 168 hours CBC:  Recent Labs Lab 01/09/14 1009 01/10/14 0444 01/11/14 0440 01/12/14 0434  WBC 11.4* 10.4 8.7 9.0  NEUTROABS 9.2*  --   --   --   HGB 13.2 12.9 10.8* 10.7*  HCT 38.2 39.1 31.8* 31.1*  MCV 88.8 91.4 90.9 92.6  PLT 169 152 139* 132*   Cardiac Enzymes: No results found for this basename: CKTOTAL, CKMB, CKMBINDEX, TROPONINI,  in the last 168 hours BNP (last 3 results) No results found for this basename: PROBNP,  in the last 8760 hours CBG: No results found for this basename: GLUCAP,  in the last 168 hours  Micro Recent Results (from the past 240 hour(s))  SURGICAL PCR SCREEN     Status: None   Collection Time    01/09/14  3:39 PM      Result Value Ref Range Status   MRSA, PCR NEGATIVE  NEGATIVE Final   Staphylococcus aureus NEGATIVE  NEGATIVE Final   Comment:            The Xpert SA Assay (FDA     approved for NASAL specimens     in patients over 74 years of age),     is one component of     a comprehensive surveillance     program.  Test performance has     been  validated by [employer] for patients greater     than or equal to 51 year old.     It is not intended     to diagnose infection nor to     guide or monitor treatment.     Studies: No results found.  Scheduled Meds: . antiseptic oral rinse  7 mL Mouth Rinse BID  . enoxaparin (LOVENOX) injection  30 mg Subcutaneous Q24H  . guaiFENesin  1,200 mg Oral BID  . metoprolol succinate  100 mg Oral Daily  . multivitamin with minerals  1 tablet Oral QHS  . polyethylene glycol  17 g Oral Daily   Continuous Infusions:       Time spent: 35 minutes    [facility] A  [facility] Pager [phone] If 7PM-7AM, please contact night-coverage at [email or web address], password [facility] 01/12/2014, 12:26 PM  LOS: 3 days

## 2014-01-13 LAB — CBC
HEMATOCRIT: 32.9 % — AB (ref 36.0–46.0)
HEMOGLOBIN: 10.8 g/dL — AB (ref 12.0–15.0)
MCH: 30.6 pg (ref 26.0–34.0)
MCHC: 32.8 g/dL (ref 30.0–36.0)
MCV: 93.2 fL (ref 78.0–100.0)
Platelets: 159 10*3/uL (ref 150–400)
RBC: 3.53 MIL/uL — ABNORMAL LOW (ref 3.87–5.11)
RDW: 13.6 % (ref 11.5–15.5)
WBC: 8.1 10*3/uL (ref 4.0–10.5)

## 2014-01-13 LAB — BASIC METABOLIC PANEL
Anion gap: 8 (ref 5–15)
BUN: 18 mg/dL (ref 6–23)
CO2: 30 mEq/L (ref 19–32)
Calcium: 9.1 mg/dL (ref 8.4–10.5)
Chloride: 98 mEq/L (ref 96–112)
Creatinine, Ser: 0.93 mg/dL (ref 0.50–1.10)
GFR calc Af Amer: 64 mL/min — ABNORMAL LOW (ref >90)
GFR, EST NON AFRICAN AMERICAN: 55 mL/min — AB (ref >90)
GLUCOSE: 113 mg/dL — AB (ref 70–99)
Potassium: 4.5 mEq/L (ref 3.7–5.3)
Sodium: 136 mEq/L — ABNORMAL LOW (ref 137–147)

## 2014-01-13 MED ORDER — ENOXAPARIN (LOVENOX) PATIENT EDUCATION KIT
PACK | Freq: Once | Status: AC
  Administered 2014-01-13: 14:00:00
  Filled 2014-01-13: qty 1

## 2014-01-13 MED ORDER — HYDROCODONE-ACETAMINOPHEN 10-325 MG PO TABS
1.0000 | ORAL_TABLET | Freq: Four times a day (QID) | ORAL | Status: DC | PRN
Start: 2014-01-13 — End: 2016-08-15

## 2014-01-13 MED ORDER — LISINOPRIL 10 MG PO TABS: 10.0000 mg | ORAL_TABLET | Freq: Every day | ORAL | Status: DC

## 2014-01-13 NOTE — Progress Notes (Signed)
[facility]  [facility] is providing the following services: RW and [employer]  If patient discharges after hours, please call [phone].   Linward Headland 01/13/2014, 12:54 PM

## 2014-01-13 NOTE — Discharge Summary (Signed)
Physician Discharge Summary  [name] YBW:[id number] DOB: [birth year] DOA: 01/09/2014  PCP: Leonard Downing, MD  Admit date: 01/09/2014 Discharge date: 01/13/2014  Time spent: 40 minutes  Recommendations for Outpatient Follow-up:  1. Followup with Dr. Tonita Cong within 2 weeks. 2. Followup with primary care physician in one to 2 weeks.  Discharge Diagnoses:  Principal Problem:   Closed right hip fracture Active Problems:   TOBACCO ABUSE   CAROTID ARTERY OCCLUSION   CVA   Hypertension   Hip fracture requiring operative repair   Hyponatremia   Discharge Condition: Stable  Diet recommendation: Heart healthy  Filed Weights   01/09/14 1531  Weight: 61.236 kg (135 lb)    History of present illness:  [name] is a 80 y.o. female with past medical history of tobacco abuse, probable COPD, history of CVA and acute MI back in 2002. Patient came in to the hospital because of right hip pain. Patient said she fell yesterday in her kitchen she slipped on water, she denies any symptoms prior to the fall. Denies any chest pain, shortness of breath, palpitations or flashing lights. No loss of consciousness after the fall. Patient had severe right hip pain after the fall but she managed to stay overnight at home and she came into the ED about 10 AM.  In the ED x-ray shows a right subcapital hip fracture, Dr. Tonita Cong from orthopedics has been consulted, patient will be admitted to the [facility] for is medical and surgical evaluation.  Hospital Course:   Right hip fracture  -Had a right hip fracture resulted from a mechanical fall.  -Right subcapital hip fracture, orthopedics consulted.  -Patient placed on hip fracture protocol, narcotics for pain management.  -Started on Lovenox for DVT prophylaxis.  -Patient had a right-sided hemiarthroplasty done on 8/22. -Seen by PT/OT and they both recommended a skilled nursing facility, patient elected to go home.  Hyponatremia  -Patient  appears to be euvolemic, cannot rule out mild SIADH secondary to her smoking/probable COPD.  -This is improved, discontinue IV fluids, sodium today is 134.   Hypertension  -Restarted back on her Toprol-XL 100 mg daily, blood pressure still high.  -Lisinopril 10 mg added at the time of discharge because of uncontrolled blood pressure.  Tobacco abuse  -Bronchodilators as needed. Mucinex added.    Procedures:  Right hip hemiarthroplasty done by Dr. Tonita Cong on 01/10/14.  Consultations:  Dr. Tonita Cong of orthopedics  Discharge Exam: Filed Vitals:   01/13/14 1014  BP: 151/66  Pulse: 72  Temp:   Resp:    General: Alert and awake, oriented x3, not in any acute distress. HEENT: anicteric sclera, pupils reactive to light and accommodation, EOMI CVS: S1-S2 clear, no murmur rubs or gallops Chest: clear to auscultation bilaterally, no wheezing, rales or rhonchi Abdomen: soft nontender, nondistended, normal bowel sounds, no organomegaly Extremities: no cyanosis, clubbing or edema noted bilaterally Neuro: Cranial nerves II-XII intact, no focal neurological deficits  Discharge Instructions You were cared for by a hospitalist during your hospital stay. If you have any questions about your discharge medications or the care you received while you were in the hospital after you are discharged, you can call the unit and asked to speak with the hospitalist on call if the hospitalist that took care of you is not available. Once you are discharged, your primary care physician will handle any further medical issues. Please note that NO REFILLS for any discharge medications will be authorized once you are discharged, as it is  imperative that you return to your primary care physician (or establish a relationship with a primary care physician if you do not have one) for your aftercare needs so that they can reassess your need for medications and monitor your lab values.  Discharge Instructions   Diet - low  sodium heart healthy    Complete by:  As directed      Increase activity slowly    Complete by:  As directed      Partial weight bearing    Complete by:  As directed   % Body Weight:  50  Laterality:  right  Extremity:  Lower            Medication List         aspirin 81 MG EC tablet  Take 81 mg by mouth daily.     CALCIUM + D PO  Take 1 tablet by mouth daily.     docusate sodium 100 MG capsule  Commonly known as:  COLACE  Take 1 capsule (100 mg total) by mouth 2 (two) times daily as needed for mild constipation.     enoxaparin 30 MG/0.3ML injection  Commonly known as:  LOVENOX  Inject 0.3 mLs (30 mg total) into the skin daily.     GLUCOSAMINE CHONDR COMPLEX PO  Take 1 capsule by mouth daily.     HYDROcodone-acetaminophen 10-325 MG per tablet  Commonly known as:  NORCO  Take 1 tablet by mouth every 6 (six) hours as needed.     lisinopril 10 MG tablet  Commonly known as:  PRINIVIL,ZESTRIL  Take 1 tablet (10 mg total) by mouth daily.     lovastatin 40 MG tablet  Commonly known as:  MEVACOR  Take 1 tablet (40 mg total) by mouth at bedtime.     metoprolol succinate 100 MG 24 hr tablet  Commonly known as:  TOPROL-XL  Take 100 mg by mouth daily. Take with or immediately following a meal.     multivitamin with minerals Tabs tablet  Take 1 tablet by mouth at bedtime.     nitroGLYCERIN 0.4 MG SL tablet  Commonly known as:  NITROSTAT  Place 0.4 mg under the tongue every 5 (five) minutes as needed for chest pain.       Allergies  Allergen Reactions  . Dextroamphetamine Sulfate Er Other (See Comments)    Euphoric feeling       Follow-up Information   Follow up with BEANE,JEFFREY C, MD In 2 weeks.   Specialty:  Orthopedic Surgery   Contact information:   [address] [place] [address] [place] [address] [phone]        The results of significant diagnostics from this hospitalization (including imaging, microbiology, ancillary and laboratory) are  listed below for reference.    Significant Diagnostic Studies: Dg Hip Complete Right  01/09/2014   CLINICAL DATA:  RIGHT hip pain post fall  EXAM: RIGHT HIP - COMPLETE 2+ VIEW  COMPARISON:  None  FINDINGS: Osseous demineralization.  Displaced subcapital fracture RIGHT femoral neck.  BILATERAL narrowing of the hip joints.  Pelvis appears intact.  No dislocation or additional fracture identified.  IMPRESSION: Displaced subcapital fracture RIGHT femoral neck.   Electronically Signed   By: Lavonia Dana M.D.   On: 01/09/2014 11:02   Dg Pelvis Portable  01/10/2014   CLINICAL DATA:  Postop right hip arthroplasty  EXAM: PORTABLE PELVIS 1-2 VIEWS  COMPARISON:  Preoperative radiographs 01/09/2014  FINDINGS: Interval right hip arthroplasty without evidence of hardware  complication. Expected surgical changes including subcutaneous air in the soft tissues about the right greater trochanter. The visualized bony pelvis is intact. The visualized left femur is unremarkable.  IMPRESSION: Surgical changes of right total hip arthroplasty without evidence of immediate complication.   Electronically Signed   By: Jacqulynn Cadet M.D.   On: 01/10/2014 10:51   Dg Chest Port 1 View  01/09/2014   CLINICAL DATA:  Hip fracture, fall, smoker, coronary artery disease post MI, preoperative evaluation  EXAM: PORTABLE CHEST - 1 VIEW  COMPARISON:  02/14/2013  FINDINGS: Upper normal heart size.  Atherosclerotic calcification aorta.  Slight pulmonary vascular congestion.  Mediastinal contours otherwise normal.  Lungs clear.  No pleural effusion or pneumothorax.  Bones demineralized.  IMPRESSION: No acute abnormalities.   Electronically Signed   By: Lavonia Dana M.D.   On: 01/09/2014 11:01    Microbiology: Recent Results (from the past 240 hour(s))  SURGICAL PCR SCREEN     Status: None   Collection Time    01/09/14  3:39 PM      Result Value Ref Range Status   MRSA, PCR NEGATIVE  NEGATIVE Final   Staphylococcus aureus NEGATIVE   NEGATIVE Final   Comment:            The Xpert SA Assay (FDA     approved for NASAL specimens     in patients over 81 years of age),     is one component of     a comprehensive surveillance     program.  Test performance has     been validated by [employer] for patients greater     than or equal to 20 year old.     It is not intended     to diagnose infection nor to     guide or monitor treatment.     Labs: Basic Metabolic Panel:  Recent Labs Lab 01/09/14 1009 01/10/14 0444 01/11/14 0440 01/12/14 0434 01/13/14 0413  NA 129* 132* 134* 134* 136*  K 5.0 4.2 4.1 4.5 4.5  CL 93* 98 99 99 98  CO2 20 23 25 24 30   GLUCOSE 133* 166* 139* 112* 113*  BUN 22 16 15 15 18   CREATININE 0.94 0.85 0.96 0.88 0.93  CALCIUM 9.3 8.7 8.7 8.8 9.1   Liver Function Tests: No results found for this basename: AST, ALT, ALKPHOS, BILITOT, PROT, ALBUMIN,  in the last 168 hours No results found for this basename: LIPASE, AMYLASE,  in the last 168 hours No results found for this basename: AMMONIA,  in the last 168 hours CBC:  Recent Labs Lab 01/09/14 1009 01/10/14 0444 01/11/14 0440 01/12/14 0434 01/13/14 0413  WBC 11.4* 10.4 8.7 9.0 8.1  NEUTROABS 9.2*  --   --   --   --   HGB 13.2 12.9 10.8* 10.7* 10.8*  HCT 38.2 39.1 31.8* 31.1* 32.9*  MCV 88.8 91.4 90.9 92.6 93.2  PLT 169 152 139* 132* 159   Cardiac Enzymes: No results found for this basename: CKTOTAL, CKMB, CKMBINDEX, TROPONINI,  in the last 168 hours BNP: BNP (last 3 results) No results found for this basename: PROBNP,  in the last 8760 hours CBG: No results found for this basename: GLUCAP,  in the last 168 hours     Signed:  [facility]  [facility] 01/13/2014, 11:41 AM

## 2014-01-13 NOTE — Progress Notes (Signed)
Occupational Therapy Treatment Patient Details Name: [name] MRN: [id number] DOB: 1928-12-10 Today's Date: 01/13/2014    History of present illness 80 yo remale  admitted8/21/ after fall/hip fracture  on right. S/P Hip hemi arthroplasty on 01/10/14.   OT comments  Pt making progress with functional goals and should continue with acute OT services to increase level of function and safety. Pt planning to d/c to her daughter's home with 24 hour sup and assist with [facility] therapies  Follow Up Recommendations  Home health OT;Supervision/Assistance - 24 hour    Equipment Recommendations  3 in 1 bedside comode;Other (comment) (ADL A/E kit)    Recommendations for Other Services      Precautions / Restrictions Precautions Precautions: Posterior Hip Restrictions Weight Bearing Restrictions: Yes RLE Weight Bearing: Partial weight bearing RLE Partial Weight Bearing Percentage or Pounds: 50%       Mobility Bed Mobility               General bed mobility comments: pt up in recliner  Transfers Overall transfer level: Needs assistance Equipment used: Rolling walker (2 wheeled) Transfers: Sit to/from Stand Sit to Stand: Min assist         General transfer comment: pt required increased time and cues for hip precautions     Balance Overall balance assessment: Needs assistance Sitting-balance support: No upper extremity supported;Feet supported Sitting balance-Leahy Scale: Good     Standing balance support: Bilateral upper extremity supported;During functional activity Standing balance-Leahy Scale: Poor                     ADL       Grooming: Standing;Min guard               Lower Body Dressing: Maximal assistance;Sit to/from stand;Cueing for safety;Moderate assistance   Toilet Transfer: Minimal assistance;Regular Toilet;RW;Grab bars;Cueing for safety (3 in 1 over toilet) Toilet Transfer Details (indicate cue type and reason): pt required increased  time and cues for hip precautions  Toileting- Clothing Manipulation and Hygiene: Minimal assistance;Sit to/from stand         General ADL Comments: pt educated on A/E with demonstration for home use      Vision  wears glasses at all times                   Perception Perception Perception Tested?: No   Praxis Praxis Praxis tested?: Not tested    Cognition   Behavior During Therapy: [facility] for tasks assessed/performed Overall Cognitive Status: Within Functional Limits for tasks assessed                                                 General Comments  pt pleasant and cooperative    Pertinent Vitals/ Pain       Pain Assessment: 0-10 Pain Score: 2  Pain Descriptors / Indicators: Aching;Sore Pain Intervention(s): Ice applied;Repositioned                                            Prior Functioning/Environment  independent           Frequency Min 2X/week     Progress Toward Goals  OT Goals(current goals can now be found in the care  plan section)  Progress towards OT goals: Progressing toward goals  Acute Rehab OT Goals Patient Stated Goal: to go home  Plan Discharge plan remains appropriate                     End of Session Equipment Utilized During Treatment: Gait belt;Rolling walker;Other (comment) (3 in 1, ADL A/E)   Activity Tolerance Patient tolerated treatment well   Patient Left in chair;with call bell/phone within reach             Time: 1146-1205 OT Time Calculation (min): 19 min  Charges: OT General Charges $OT Visit: 1 Procedure OT Treatments $Self Care/Home Management : 8-22 mins  Britt Bottom 01/13/2014, 12:58 PM

## 2014-01-13 NOTE — Progress Notes (Signed)
Physical Therapy Treatment Patient Details Name: [name] MRN: [id number] DOB: 09/01/1928 Today's Date: 01/13/2014    History of Present Illness 80 yo remale  admitted8/21/ after fall/hip fracture  on right. S/P Hip hemi arthroplasty on 01/10/14.    PT Comments    Ready for DC.  Follow Up Recommendations  Home health PT;Supervision/Assistance - 24 hour     Equipment Recommendations  Rolling walker with 5" wheels    Recommendations for Other Services       Precautions / Restrictions Precautions Precautions: Posterior Hip Precaution Comments: Pt recalls 3/3 THP Restrictions Weight Bearing Restrictions: Yes RLE Weight Bearing: Partial weight bearing RLE Partial Weight Bearing Percentage or Pounds: Pt aware she is PWB    Mobility  Bed Mobility   Bed Mobility: Supine to Sit     Supine to sit: Min assist     General bed mobility comments: increased time  Transfers Overall transfer level: Needs assistance   Transfers: Sit to/from Stand Sit to Stand: Min assist         General transfer comment: increased time  Ambulation/Gait Ambulation/Gait assistance: Min assist Ambulation Distance (Feet): 30 Feet Assistive device: Rolling walker (2 wheeled) Gait Pattern/deviations: Step-to pattern;Antalgic Gait velocity: decreased       Stairs Stairs: Yes Stairs assistance: Min assist Stair Management: Backwards;With walker;Step to pattern;No rails Number of Stairs: 2 General stair comments: pt's caregiver instructed in stairs,  verbalizes understanding and demonstrated  how to assist pt. there will be 2 persons present.  Wheelchair Mobility    Modified Rankin (Stroke Patients Only)       Balance                                    Cognition Arousal/Alertness: Awake/alert Behavior During Therapy: Impulsive                        Exercises      General Comments        Pertinent Vitals/Pain      Home Living                       Prior Function            PT Goals (current goals can now be found in the care plan section) Progress towards PT goals: Progressing toward goals    Frequency       PT Plan Current plan remains appropriate    Co-evaluation             End of Session   Activity Tolerance: Patient tolerated treatment well Patient left: in chair;with call bell/phone within reach     Time: 1421-1506 PT Time Calculation (min): 45 min  Charges:  $Gait Training: 8-22 mins $Self Care/Home Management: 23-37                    G Codes:      [name] 01/13/2014, 6:21 PM

## 2014-01-13 NOTE — Discharge Instructions (Signed)
Partial weight bearing R leg (50%) with walker for ambulation Posterior hip precautions to prevent dislocation Aquacel dressing may remain in place until follow up. If the dressing becomes saturated or peels back may remove and place gauze and tape dressing. May shower with aquacel in place but no submerging in water. If aquacel is removed keep gauze dressing clean and dry and change daily. Follow up with Dr. Tonita Cong 10-14 days post-op for staple removal and xrays

## 2014-01-13 NOTE — Progress Notes (Signed)
Physical Therapy Treatment Patient Details Name: [name] MRN: [id number] DOB: 04-Dec-1928 Today's Date: 01/13/2014    History of Present Illness 80 yo remale  admitted8/21/ after fall/hip fracture  on right. S/P Hip hemi arthroplasty on 01/10/14.    PT Comments    POD # 3 assisted OOB to amb in hallway then practiced stairs.  Pt plans to D/C to a friends home.  Follow Up Recommendations  Home health PT;Supervision/Assistance - 24 hour (pt stated now she is going to a friends home)     Equipment Recommendations  Rolling walker with 5" wheels    Recommendations for Other Services       Precautions / Restrictions Precautions Precautions: Posterior Hip Precaution Comments: Pt recalls 3/3 THP Restrictions Weight Bearing Restrictions: Yes RLE Weight Bearing: Partial weight bearing RLE Partial Weight Bearing Percentage or Pounds: Pt aware she is PWB    Mobility  Bed Mobility Overal bed mobility: Needs Assistance Bed Mobility: Supine to Sit     Supine to sit: Min assist     General bed mobility comments: increased time  Transfers Overall transfer level: Needs assistance Equipment used: Rolling walker (2 wheeled) Transfers: Sit to/from Stand Sit to Stand: Min assist         General transfer comment: increased time  Ambulation/Gait Ambulation/Gait assistance: Min assist;Min guard Ambulation Distance (Feet): 30 Feet Assistive device: Rolling walker (2 wheeled) Gait Pattern/deviations: Step-to pattern Gait velocity: decreased   General Gait Details: increased time   Stairs Stairs: Yes Stairs assistance: Min assist Stair Management: No rails;Step to pattern;Backwards;With walker Number of Stairs: 2 General stair comments: 50% VC's on proper sequencing and safety  Wheelchair Mobility    Modified Rankin (Stroke Patients Only)       Balance Overall balance assessment: Needs assistance Sitting-balance support: No upper extremity supported;Feet  supported Sitting balance-Leahy Scale: Good     Standing balance support: Bilateral upper extremity supported;During functional activity Standing balance-Leahy Scale: Poor                      Cognition Arousal/Alertness: Awake/alert Behavior During Therapy: WFL for tasks assessed/performed Overall Cognitive Status: Within Functional Limits for tasks assessed                      Exercises      General Comments        Pertinent Vitals/Pain Pain Assessment: 0-10 Pain Score: 2  Pain Descriptors / Indicators: Aching;Sore Pain Intervention(s): Ice applied;Repositioned    Home Living                      Prior Function            PT Goals (current goals can now be found in the care plan section) Acute Rehab PT Goals Patient Stated Goal: to go home Progress towards PT goals: Progressing toward goals    Frequency  Min 3X/week    PT Plan      Co-evaluation             End of Session Equipment Utilized During Treatment: Gait belt Activity Tolerance: Patient tolerated treatment well Patient left: in chair;with call bell/phone within reach     Time:  -     Charges:    1 gt 1 ta                   G Codes:      Rica Koyanagi  PTA  [facility]  Acute  Rehab Pager      [phone]

## 2014-01-16 ENCOUNTER — Other Ambulatory Visit: Payer: Self-pay | Admitting: *Deleted

## 2014-01-16 MED ORDER — METOPROLOL SUCCINATE ER 100 MG PO TB24: 100.0000 mg | ORAL_TABLET | Freq: Every day | ORAL | Status: DC

## 2014-02-24 ENCOUNTER — Encounter: Payer: Self-pay | Admitting: Internal Medicine

## 2014-04-08 ENCOUNTER — Encounter: Payer: Self-pay | Admitting: Neurology

## 2014-04-14 ENCOUNTER — Encounter: Payer: Self-pay | Admitting: Neurology

## 2015-11-19 DIAGNOSIS — H6120 Impacted cerumen, unspecified ear: Secondary | ICD-10-CM | POA: Insufficient documentation

## 2016-08-15 ENCOUNTER — Ambulatory Visit (INDEPENDENT_AMBULATORY_CARE_PROVIDER_SITE_OTHER): Payer: Medicare Other

## 2016-08-15 ENCOUNTER — Encounter: Payer: Self-pay | Admitting: Podiatry

## 2016-08-15 ENCOUNTER — Ambulatory Visit (INDEPENDENT_AMBULATORY_CARE_PROVIDER_SITE_OTHER): Payer: Medicare Other | Admitting: Podiatry

## 2016-08-15 VITALS — BP 135/82 | HR 82 | Resp 16

## 2016-08-15 DIAGNOSIS — Q828 Other specified congenital malformations of skin: Secondary | ICD-10-CM | POA: Diagnosis not present

## 2016-08-15 DIAGNOSIS — M779 Enthesopathy, unspecified: Principal | ICD-10-CM

## 2016-08-15 DIAGNOSIS — M2031 Hallux varus (acquired), right foot: Secondary | ICD-10-CM | POA: Diagnosis not present

## 2016-08-15 DIAGNOSIS — M778 Other enthesopathies, not elsewhere classified: Secondary | ICD-10-CM

## 2016-08-15 DIAGNOSIS — M7751 Other enthesopathy of right foot: Secondary | ICD-10-CM

## 2016-08-15 NOTE — Progress Notes (Signed)
   Subjective:    Patient ID: [name], female    DOB: January 17, 1929, 83 y.o.   MRN: [id number]  HPI: She presents today with a chief complaint of a painful area beneath the first metatarsophalangeal joint of the left foot. 83 year old female states is a tender callus there is been present for 5 years she's been several different doctors including doctors who have cut this out as a wart and she even has special inserts. She's also [facility] and she also wears soft inserts to help with the pressure.  Review of Systems  HENT: Positive for hearing loss.   Endocrine: Positive for heat intolerance.  Genitourinary: Positive for frequency.  Musculoskeletal: Positive for gait problem.  Hematological: Bruises/bleeds easily.  All other systems reviewed and are negative.      Objective:   Physical Exam: Vital signs are stable she is alert and oriented 3. Pulses are palpable. Neurologic sensorium is intact. Tendon reflexes are intact. Muscle strength is 5 over 5 dorsiflexion plantar flexion inversion Z Birdsall intrinsic musculature is intact. Orthopedic evaluation demonstrates contracted metatarsophalangeal joint with hallux malleus first metatarsophalangeal joint right foot. This is resulting in a reactive hyperkeratosis plantar aspect of the tibial sesamoid right. Radiographs do demonstrate osteoarthritic changes in this area. No open lesions or wounds are noted.        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  Plan: I expressed to the patient that this will be a chronic problem and the debridement regularly and padding are her only options for this. She understands this and is amenable to it debrided reactive hyperkeratosis today placed padding or follow-up with her on an as-needed basis.

## 2017-01-29 ENCOUNTER — Other Ambulatory Visit: Payer: Self-pay | Admitting: Family Medicine

## 2017-01-29 DIAGNOSIS — R945 Abnormal results of liver function studies: Principal | ICD-10-CM

## 2017-01-29 DIAGNOSIS — R7989 Other specified abnormal findings of blood chemistry: Secondary | ICD-10-CM

## 2017-02-07 ENCOUNTER — Ambulatory Visit
Admission: RE | Admit: 2017-02-07 | Discharge: 2017-02-07 | Disposition: A | Discharge status: Home / Self Care | Payer: Medicare Other | Source: Ambulatory Visit | Attending: Family Medicine | Admitting: Family Medicine

## 2017-02-07 DIAGNOSIS — R7989 Other specified abnormal findings of blood chemistry: Secondary | ICD-10-CM

## 2017-02-07 DIAGNOSIS — R945 Abnormal results of liver function studies: Principal | ICD-10-CM

## 2017-03-14 ENCOUNTER — Telehealth: Payer: Self-pay | Admitting: Podiatry

## 2017-03-14 NOTE — Telephone Encounter (Signed)
Left message informing pt our office did have a pedorthist that worked with Dr. Stephenie Acres orthotic prescription and we did have x-rays, that she should make an appt to come in and discuss the orthotics and the current status of her feet since we had not seen her since 07/2016.

## 2017-03-14 NOTE — Telephone Encounter (Signed)
I was seen by Dr. Milinda Pointer several months ago. I have two questions I wanted to ask. One, does your office have a department that makes custom arch supports and two, does the x-rays Dr. Milinda Pointer took show my arches. I was diagnosed in the past with fallen arches and had custom arch supports. I will be away from home for the next 3-4 hours so please call me back after 4 pm today or tomorrow at [phone]. I look forward to hearing from your department. Thank you.

## 2017-03-30 ENCOUNTER — Inpatient Hospital Stay (HOSPITAL_COMMUNITY)
Admission: EM | Admit: 2017-03-30 | Discharge: 2017-04-04 | DRG: 181 | Disposition: A | Discharge status: Home Health | Payer: Medicare Other | Attending: Family Medicine | Admitting: Family Medicine

## 2017-03-30 ENCOUNTER — Encounter (HOSPITAL_COMMUNITY): Payer: Self-pay | Admitting: Emergency Medicine

## 2017-03-30 ENCOUNTER — Emergency Department (HOSPITAL_COMMUNITY): Payer: Medicare Other

## 2017-03-30 ENCOUNTER — Other Ambulatory Visit: Payer: Self-pay

## 2017-03-30 DIAGNOSIS — C3412 Malignant neoplasm of upper lobe, left bronchus or lung: Secondary | ICD-10-CM | POA: Diagnosis not present

## 2017-03-30 DIAGNOSIS — R7401 Elevation of levels of liver transaminase levels: Secondary | ICD-10-CM

## 2017-03-30 DIAGNOSIS — R42 Dizziness and giddiness: Secondary | ICD-10-CM | POA: Diagnosis not present

## 2017-03-30 DIAGNOSIS — Z66 Do not resuscitate: Secondary | ICD-10-CM | POA: Diagnosis present

## 2017-03-30 DIAGNOSIS — I2510 Atherosclerotic heart disease of native coronary artery without angina pectoris: Secondary | ICD-10-CM | POA: Diagnosis present

## 2017-03-30 DIAGNOSIS — F17210 Nicotine dependence, cigarettes, uncomplicated: Secondary | ICD-10-CM | POA: Diagnosis present

## 2017-03-30 DIAGNOSIS — Z720 Tobacco use: Secondary | ICD-10-CM

## 2017-03-30 DIAGNOSIS — R918 Other nonspecific abnormal finding of lung field: Secondary | ICD-10-CM

## 2017-03-30 DIAGNOSIS — D3502 Benign neoplasm of left adrenal gland: Secondary | ICD-10-CM

## 2017-03-30 DIAGNOSIS — I6529 Occlusion and stenosis of unspecified carotid artery: Secondary | ICD-10-CM | POA: Diagnosis present

## 2017-03-30 DIAGNOSIS — D72829 Elevated white blood cell count, unspecified: Secondary | ICD-10-CM | POA: Diagnosis present

## 2017-03-30 DIAGNOSIS — R740 Nonspecific elevation of levels of transaminase and lactic acid dehydrogenase [LDH]: Secondary | ICD-10-CM | POA: Diagnosis present

## 2017-03-30 DIAGNOSIS — E871 Hypo-osmolality and hyponatremia: Secondary | ICD-10-CM | POA: Diagnosis not present

## 2017-03-30 DIAGNOSIS — Z823 Family history of stroke: Secondary | ICD-10-CM

## 2017-03-30 DIAGNOSIS — E785 Hyperlipidemia, unspecified: Secondary | ICD-10-CM | POA: Diagnosis present

## 2017-03-30 DIAGNOSIS — Z7982 Long term (current) use of aspirin: Secondary | ICD-10-CM

## 2017-03-30 DIAGNOSIS — Z6823 Body mass index (BMI) 23.0-23.9, adult: Secondary | ICD-10-CM

## 2017-03-30 DIAGNOSIS — K5900 Constipation, unspecified: Secondary | ICD-10-CM | POA: Diagnosis present

## 2017-03-30 DIAGNOSIS — E222 Syndrome of inappropriate secretion of antidiuretic hormone: Secondary | ICD-10-CM

## 2017-03-30 DIAGNOSIS — Z8673 Personal history of transient ischemic attack (TIA), and cerebral infarction without residual deficits: Secondary | ICD-10-CM

## 2017-03-30 DIAGNOSIS — I714 Abdominal aortic aneurysm, without rupture: Secondary | ICD-10-CM | POA: Diagnosis present

## 2017-03-30 DIAGNOSIS — D649 Anemia, unspecified: Secondary | ICD-10-CM | POA: Diagnosis present

## 2017-03-30 DIAGNOSIS — R634 Abnormal weight loss: Secondary | ICD-10-CM | POA: Diagnosis present

## 2017-03-30 DIAGNOSIS — R0989 Other specified symptoms and signs involving the circulatory and respiratory systems: Secondary | ICD-10-CM

## 2017-03-30 DIAGNOSIS — I10 Essential (primary) hypertension: Secondary | ICD-10-CM | POA: Diagnosis present

## 2017-03-30 DIAGNOSIS — K449 Diaphragmatic hernia without obstruction or gangrene: Secondary | ICD-10-CM | POA: Diagnosis present

## 2017-03-30 DIAGNOSIS — I252 Old myocardial infarction: Secondary | ICD-10-CM

## 2017-03-30 DIAGNOSIS — C799 Secondary malignant neoplasm of unspecified site: Secondary | ICD-10-CM | POA: Diagnosis present

## 2017-03-30 DIAGNOSIS — I700 Atherosclerosis of aorta: Secondary | ICD-10-CM | POA: Diagnosis present

## 2017-03-30 LAB — CBC
HCT: 34.3 % — ABNORMAL LOW (ref 36.0–46.0)
Hemoglobin: 11.4 g/dL — ABNORMAL LOW (ref 12.0–15.0)
MCH: 28.2 pg (ref 26.0–34.0)
MCHC: 33.2 g/dL (ref 30.0–36.0)
MCV: 84.9 fL (ref 78.0–100.0)
Platelets: 389 10*3/uL (ref 150–400)
RBC: 4.04 MIL/uL (ref 3.87–5.11)
RDW: 13.2 % (ref 11.5–15.5)
WBC: 10.3 10*3/uL (ref 4.0–10.5)

## 2017-03-30 LAB — URINALYSIS, ROUTINE W REFLEX MICROSCOPIC
Bilirubin Urine: NEGATIVE
Glucose, UA: NEGATIVE mg/dL
Ketones, ur: NEGATIVE mg/dL
Nitrite: POSITIVE — AB
Protein, ur: 30 mg/dL — AB
Specific Gravity, Urine: 1.02 (ref 1.005–1.030)
pH: 7 (ref 5.0–8.0)

## 2017-03-30 LAB — BASIC METABOLIC PANEL
ANION GAP: 8 (ref 5–15)
BUN: 12 mg/dL (ref 6–20)
CO2: 24 mmol/L (ref 22–32)
Calcium: 9 mg/dL (ref 8.9–10.3)
Chloride: 91 mmol/L — ABNORMAL LOW (ref 101–111)
Creatinine, Ser: 0.72 mg/dL (ref 0.44–1.00)
GFR calc Af Amer: >60 mL/min (ref >60)
GFR calc non Af Amer: >60 mL/min (ref >60)
GLUCOSE: 108 mg/dL — AB (ref 65–99)
Potassium: 4.4 mmol/L (ref 3.5–5.1)
SODIUM: 123 mmol/L — AB (ref 135–145)

## 2017-03-30 LAB — TSH: TSH: 1.511 u[IU]/mL (ref 0.350–4.500)

## 2017-03-30 MED ORDER — ACETAMINOPHEN 325 MG PO TABS
650.0000 mg | ORAL_TABLET | Freq: Four times a day (QID) | ORAL | Status: DC | PRN
  Administered 2017-04-03: 650 mg via ORAL
  Filled 2017-03-30: qty 2

## 2017-03-30 MED ORDER — IOPAMIDOL (ISOVUE-300) INJECTION 61%
INTRAVENOUS | Status: AC
  Administered 2017-03-30: 75 mL
  Filled 2017-03-30: qty 75

## 2017-03-30 MED ORDER — HYDROCODONE-ACETAMINOPHEN 5-325 MG PO TABS
1.0000 | ORAL_TABLET | ORAL | Status: DC | PRN
  Administered 2017-03-31 – 2017-04-02 (×2): 1 via ORAL
  Filled 2017-03-30 (×2): qty 1

## 2017-03-30 MED ORDER — ACETAMINOPHEN 650 MG RE SUPP: 650.0000 mg | Freq: Four times a day (QID) | RECTAL | Status: DC | PRN

## 2017-03-30 MED ORDER — SODIUM CHLORIDE 0.9 % IV SOLN
INTRAVENOUS | Status: DC
  Administered 2017-03-30 – 2017-04-01 (×3): via INTRAVENOUS
  Administered 2017-04-02: 1 mL via INTRAVENOUS

## 2017-03-30 MED ORDER — LISINOPRIL 20 MG PO TABS
20.0000 mg | ORAL_TABLET | Freq: Every day | ORAL | Status: DC
  Administered 2017-03-30 – 2017-04-04 (×6): 20 mg via ORAL
  Filled 2017-03-30 (×6): qty 1

## 2017-03-30 MED ORDER — ENOXAPARIN SODIUM 30 MG/0.3ML ~~LOC~~ SOLN
30.0000 mg | SUBCUTANEOUS | Status: DC
  Administered 2017-03-31 – 2017-04-03 (×4): 30 mg via SUBCUTANEOUS
  Filled 2017-03-30 (×4): qty 0.3

## 2017-03-30 MED ORDER — ASPIRIN EC 81 MG PO TBEC
81.0000 mg | DELAYED_RELEASE_TABLET | Freq: Every day | ORAL | Status: DC
  Administered 2017-03-31 – 2017-04-04 (×5): 81 mg via ORAL
  Filled 2017-03-30 (×7): qty 1

## 2017-03-30 MED ORDER — ALBUTEROL SULFATE (2.5 MG/3ML) 0.083% IN NEBU
5.0000 mg | INHALATION_SOLUTION | Freq: Once | RESPIRATORY_TRACT | Status: AC
  Administered 2017-03-30: 5 mg via RESPIRATORY_TRACT
  Filled 2017-03-30: qty 6

## 2017-03-30 MED ORDER — NITROGLYCERIN 0.4 MG SL SUBL: 0.4000 mg | SUBLINGUAL_TABLET | SUBLINGUAL | Status: DC | PRN

## 2017-03-30 MED ORDER — SODIUM CHLORIDE 0.9 % IV SOLN
INTRAVENOUS | Status: DC
  Administered 2017-03-30: 16:00:00 via INTRAVENOUS

## 2017-03-30 NOTE — ED Notes (Signed)
Patient transported to X-ray 

## 2017-03-30 NOTE — ED Notes (Signed)
Meal tray provided. To be sent up with pt.

## 2017-03-30 NOTE — H&P (Addendum)
[facility]                                                                                    Patient Demographics  [name], is a 83 y.o. female  CSN: [id number]  MRN: [id number]  DOB - 12-Jul-1928  Admit Date - 03/30/2017  Outpatient Primary MD for the patient is Leonard Downing, MD   With History of -  Past Medical History:  Diagnosis Date  . Abdominal aortic atherosclerosis ([place])   . CAD (coronary artery disease)   . Carotid artery occlusion   . Common bile duct dilation   . Gallstones   . Headache(784.0)   . Hiatal hernia   . HLD (hyperlipidemia)   . Myocardial infarction ([place])   . PONV (postoperative nausea and vomiting)   . Stroke [facility]) 2003      Past Surgical History:  Procedure Laterality Date  . ABDOMINAL HYSTERECTOMY    . ANKLE FRACTURE SURGERY    . APPENDECTOMY    . BLADDER SURGERY    . ROTATOR CUFF REPAIR Left   . TONSILLECTOMY      in for   Chief Complaint  Patient presents with  . Weakness     HPI  [name]  is a 83 y.o. female, with past medical history significant for coronary artery disease status post MI in the past, abdominal aortic aneurysm, carotid artery occlusion and common bile duct dilatation presenting today with 3 weeks history of weakness, fatigue and dizziness. She is a current smoker. Patient denies any chest pains , shortness of breath, nausea, vomiting or diarrhea. She reports decreased appetite with poor by mouth intake and she has lost 9 pounds in the last 1 year. CT of the chest in the emergency room showed a 6.4 cm left upper lobe lesion suspicious for primary bronchogenic carcinoma, metastatic. She was also noted to be hyponatremic . Patient is admitted for management of her hyponatremia and workup for her left upper lobe mass.    Review of Systems    In addition to the HPI above,  No Fever-chills, No Headache, No changes with Vision or hearing, No problems swallowing food or  Liquids, No Chest pain, Cough or Shortness of Breath, No Abdominal pain, No Nausea or Vommitting, Bowel movements are regular, No Blood in stool or Urine, No dysuria, No new skin rashes or bruises, No new joints pains-aches,  No recent weight gain or loss, No polyuria, polydypsia or polyphagia, No significant Mental Stressors.  A full 10 point Review of Systems was done, except as stated above, all other Review of Systems were negative.   Social History Social History   Tobacco Use  . Smoking status: Current Every Day Smoker    Types: Cigarettes  . Smokeless tobacco: Never Used  Substance Use Topics  . Alcohol use: No     Family History Family History  Problem Relation Age of Onset  . Stroke Mother   . Breast cancer Maternal Aunt   . Prostate cancer Maternal Grandfather      Prior to Admission medications   Medication Sig Start Date End Date Taking? Authorizing Provider  aspirin 81 MG  EC tablet Take 81 mg by mouth daily.      [provider]  Calcium Carbonate-Vitamin D (CALCIUM + D PO) Take 1 tablet by mouth daily.    [provider]  Glucosamine-Chondroitin (GLUCOSAMINE CHONDR COMPLEX PO) Take 1 capsule by mouth daily.    [provider]  lisinopril (PRINIVIL,ZESTRIL) 20 MG tablet  07/31/16   [provider]  Multiple Vitamin (MULTIVITAMIN WITH MINERALS) TABS tablet Take 1 tablet by mouth at bedtime.    [provider]  nitroGLYCERIN (NITROSTAT) 0.4 MG SL tablet Place 0.4 mg under the tongue every 5 (five) minutes as needed for chest pain.    [provider]    Allergies  Allergen Reactions  . Dextroamphetamine Sulfate Er Other (See Comments)    Euphoric feeling  . Other     Other reaction(s): Other (See Comments) Euphoric feeling    Physical Exam  Vitals  Blood pressure 138/71, pulse 86, temperature 98.3 F (36.8 C), temperature source Oral, resp. rate 20, SpO2 95 %.   1. General a very pleasant,  well-developed female in no acute distress at this time  2. Normal affect and insight, Not Suicidal or Homicidal, Awake Alert, Oriented X 3.  3. No F.N deficits, grossly patient moving all extremities.  4. Ears and Eyes appear Normal, Conjunctivae clear, PERRLA. Moist Oral Mucosa.  5. Supple Neck, No JVD, No cervical lymphadenopathy appriciated, No Carotid Bruits.  6. Symmetrical Chest wall movement, no rhonchi or wheezing, mild decrease of lung sounds on the left.  7. RRR, No Gallops, Rubs or Murmurs, No Parasternal Heave.  8. Positive Bowel Sounds, Abdomen Soft, Non tender, No organomegaly appriciated,No rebound -guarding or rigidity.  9.  No Cyanosis, Normal Skin Turgor, No Skin Rash or Bruise.  10. Good muscle tone,  joints appear normal , no pedal edema.    Data Review  CBC Recent Labs  Lab 03/30/17 1402  WBC 10.3  HGB 11.4*  HCT 34.3*  PLT 389  MCV 84.9  MCH 28.2  MCHC 33.2  RDW 13.2   ------------------------------------------------------------------------------------------------------------------  Chemistries  Recent Labs  Lab 03/30/17 1402  NA 123*  K 4.4  CL 91*  CO2 24  GLUCOSE 108*  BUN 12  CREATININE 0.72  CALCIUM 9.0   ------------------------------------------------------------------------------------------------------------------ CrCl cannot be calculated (Unknown ideal weight.). ------------------------------------------------------------------------------------------------------------------ No results for input(s): TSH, T4TOTAL, T3FREE, THYROIDAB in the last 72 hours.  Invalid input(s): FREET3   Coagulation profile No results for input(s): INR, PROTIME in the last 168 hours. ------------------------------------------------------------------------------------------------------------------- No results for input(s): DDIMER in the last 72  hours. -------------------------------------------------------------------------------------------------------------------  Cardiac Enzymes No results for input(s): CKMB, TROPONINI, MYOGLOBIN in the last 168 hours.  Invalid input(s): CK ------------------------------------------------------------------------------------------------------------------ Invalid input(s): POCBNP   ---------------------------------------------------------------------------------------------------------------  Urinalysis    Component Value Date/Time   COLORURINE YELLOW 03/30/2017 1750   APPEARANCEUR HAZY (A) 03/30/2017 1750   LABSPEC 1.020 03/30/2017 1750   PHURINE 7.0 03/30/2017 1750   GLUCOSEU NEGATIVE 03/30/2017 1750   HGBUR SMALL (A) 03/30/2017 1750   BILIRUBINUR NEGATIVE 03/30/2017 1750   KETONESUR NEGATIVE 03/30/2017 1750   PROTEINUR 30 (A) 03/30/2017 1750   UROBILINOGEN 0.2 01/09/2014 1448   NITRITE POSITIVE (A) 03/30/2017 1750   LEUKOCYTESUR TRACE (A) 03/30/2017 1750    ----------------------------------------------------------------------------------------------------------------   Imaging results:   Dg Chest 2 View  Result Date: 03/30/2017 CLINICAL DATA:  Decreased appetite and weakness EXAM: CHEST  2 VIEW COMPARISON:  01/09/2014 FINDINGS: Cardiac shadow is within normal limits. The lungs are well  aerated bilaterally. A lobulated soft tissue density is noted in the left lung apex new from the prior exam and highly suspicious for underlying neoplasm. Aortic calcifications are seen. No other focal abnormality is noted. IMPRESSION: Soft tissue masslike density in the left apex suspicious for underlying neoplasm. CT of the chest with contrast is recommended for further evaluation. These results were called by telephone at the time of interpretation on 03/30/2017 at 3:58 pm to Dr. Carmin Muskrat , who verbally acknowledged these results. Electronically Signed   By: Inez Catalina M.D.   On:  03/30/2017 15:58   Ct Chest W Contrast  Result Date: 03/30/2017 CLINICAL DATA:  New masslike opacity at the left lung apex on chest radiograph EXAM: CT CHEST WITH CONTRAST TECHNIQUE: Multidetector CT imaging of the chest was performed during intravenous contrast administration. CONTRAST:  71mL ISOVUE-300 IOPAMIDOL (ISOVUE-300) INJECTION 61% COMPARISON:  Chest radiograph from earlier today. FINDINGS: Cardiovascular: Normal heart size. No significant pericardial fluid/thickening. Left main, left anterior descending, left circumflex and right coronary atherosclerosis. Atherosclerotic nonaneurysmal thoracic aorta, noting extensive ulcerated plaque throughout the aorta. Normal caliber pulmonary arteries. No central pulmonary emboli. Mediastinum/Nodes: Subcentimeter hypodense left thyroid lobe nodule. Unremarkable esophagus. No axillary adenopathy. Mildly enlarged 1.0 cm left paratracheal node (series 2/ image 40). Mildly enlarged 1.2 cm left suprahilar node (series 2/ image 39). No additional pathologically enlarged mediastinal or hilar nodes. Lungs/Pleura: No pneumothorax. No pleural effusion. Mild centrilobular and paraseptal emphysema. Spiculated irregular solid 6.4 x 5.3 cm posterior apical left upper lobe lung mass (series 3/image 15), with suggestion of early invasion of the left paraspinal soft tissues at the T3-4 level (series 2/ image 23 and series 4/ image 58). No definite osseous invasion. Punctate calcified granulomas in the right upper lung. Otherwise no significant pulmonary nodules. Upper abdomen: Indeterminate 1.5 cm left adrenal nodule with density 65 HU (series 2/ image 126). Musculoskeletal: No aggressive appearing focal osseous lesions. Mild thoracic spondylosis. IMPRESSION: 1. Spiculated irregular solid 6.4 cm posterior apical left upper lobe lung mass, highly suspicious for primary bronchogenic carcinoma. Suggestion of early invasion of the left paraspinal soft tissues at the T3-4 level, without  appreciable osseous invasion. 2. Ipsilateral hilar and ipsilateral mediastinal lymphadenopathy, suspicious for nodal metastatic disease. 3. Indeterminate left adrenal nodule, cannot exclude left adrenal metastasis. 4. Multidisciplinary thoracic oncology consultation and PET-CT recommended . 5. Left main and 3 vessel coronary atherosclerosis. Aortic Atherosclerosis (ICD10-I70.0) and Emphysema (ICD10-J43.9). Electronically Signed   By: Ilona Sorrel M.D.   On: 03/30/2017 17:05    My personal review of EKG: Rhythm NSR, left ventricular hypertrophy with incomplete left bundle branch block at 73 bpm    Assessment & Plan  1. Normovolemic hyponatremia     Probably secondary to lung mass 2. Left upper lobe lung mass, possible bronchogenic cancer  Plan Admit under observation IV fluids, normal saline Fluid restriction Check TSH Consult oncology in a.m. for further workup Check labs in a.m. Consult social worker in a.m. for placement since patient lives alone .    DVT Prophylaxis Lovenox  AM Labs Ordered, also please review Full Orders  Family Communication: Admission, patients condition and plan of care including tests being ordered have been discussed with the patient and daughter who indicate understanding and agree with the plan and Code Status.  Code Status DO NOT RESUSCITATE  Disposition Plan: Home  Time spent in minutes : 38 minutes  Condition GUARDED   @SIGNATURE @

## 2017-03-30 NOTE — ED Notes (Signed)
Patient transported to CT 

## 2017-03-30 NOTE — ED Notes (Signed)
ED Provider at bedside. 

## 2017-03-30 NOTE — ED Triage Notes (Signed)
Pt arrives by POV with daughter from home where pt lives alone. Pt states for the last 2 weeks she has had a decreased appetite so has been not eating often and has began to feel weak. Pt denies any fall or LOC. Pt has no pain.

## 2017-03-30 NOTE — ED Provider Notes (Signed)
[facility] EMERGENCY DEPARTMENT Provider Note   CSN: [id number] Arrival date & time: 03/30/17  1345     History   Chief Complaint Chief Complaint  Patient presents with  . Weakness    HPI [name] is a 83 y.o. female.  HPI  Patient presents with her daughter who assists with the HPI. Patient presents due to concern of new weakness, early satiety, easy fatigability. Symptoms began about 3 weeks ago without clear precipitant. Since onset of symptoms been persistent. Patient has minimal cough, but with the aforementioned symptoms and concern of cough she went to her physician's office yesterday. She was started on a nebulizer treatment regimen. She notes that since that time she has had no substantial changes in her condition. No fever, no chills, no confusion, no disorientation.   Past Medical History:  Diagnosis Date  . Abdominal aortic atherosclerosis ([place])   . CAD (coronary artery disease)   . Carotid artery occlusion   . Common bile duct dilation   . Gallstones   . Headache(784.0)   . Hiatal hernia   . HLD (hyperlipidemia)   . Myocardial infarction ([place])   . PONV (postoperative nausea and vomiting)   . Stroke [facility]) 2003    Patient Active Problem List   Diagnosis Date Noted  . Cerumen impaction 11/19/2015  . Hip fracture requiring operative repair ([place]) 01/09/2014  . Closed right hip fracture ([place]) 01/09/2014  . Hyponatremia 01/09/2014  . Symptomatic cholelithiasis 03/20/2013  . Dilated bile duct 03/20/2013  . Abdominal aortic atherosclerosis ([place])   . Hypertension 10/19/2011  . TOBACCO ABUSE 02/18/2009  . HYPERLIPIDEMIA 02/17/2009  . ANXIETY DISORDER 02/17/2009  . CAD 02/17/2009  . CAROTID ARTERY OCCLUSION 02/17/2009  . CVA 02/17/2009    Past Surgical History:  Procedure Laterality Date  . ABDOMINAL HYSTERECTOMY    . ANKLE FRACTURE SURGERY    . APPENDECTOMY    . BLADDER SURGERY    . ROTATOR CUFF REPAIR Left   .  TONSILLECTOMY      OB History    No data available       Home Medications    Prior to Admission medications   Medication Sig Start Date End Date Taking? Authorizing Provider  aspirin 81 MG EC tablet Take 81 mg by mouth daily.      [provider]  Calcium Carbonate-Vitamin D (CALCIUM + D PO) Take 1 tablet by mouth daily.    [provider]  Glucosamine-Chondroitin (GLUCOSAMINE CHONDR COMPLEX PO) Take 1 capsule by mouth daily.    [provider]  lisinopril (PRINIVIL,ZESTRIL) 20 MG tablet  07/31/16   [provider]  Multiple Vitamin (MULTIVITAMIN WITH MINERALS) TABS tablet Take 1 tablet by mouth at bedtime.    [provider]  nitroGLYCERIN (NITROSTAT) 0.4 MG SL tablet Place 0.4 mg under the tongue every 5 (five) minutes as needed for chest pain.    [provider]    Family History Family History  Problem Relation Age of Onset  . Stroke Mother   . Breast cancer Maternal Aunt   . Prostate cancer Maternal Grandfather     Social History Social History   Tobacco Use  . Smoking status: Current Every Day Smoker    Types: Cigarettes  . Smokeless tobacco: Never Used  Substance Use Topics  . Alcohol use: No  . Drug use: No     Allergies   Dextroamphetamine sulfate er and Other   Review of Systems Review of Systems  Constitutional:       Per HPI, otherwise negative  HENT:       Per HPI, otherwise negative  Respiratory:       Per HPI, otherwise negative  Cardiovascular:       Per HPI, otherwise negative  Gastrointestinal: Positive for nausea. Negative for diarrhea and vomiting.  Endocrine:       Negative aside from HPI  Genitourinary:       Neg aside from HPI   Musculoskeletal:       Per HPI, otherwise negative  Skin: Negative.   Neurological: Positive for weakness and light-headedness. Negative for syncope.     Physical Exam Updated Vital Signs BP (!) 170/72   Pulse 74   Temp 98.3 F (36.8 C) (Oral)    Resp 20   SpO2 98%   Physical Exam  Constitutional: She is oriented to person, place, and time. She appears well-developed and well-nourished. No distress.  HENT:  Head: Normocephalic and atraumatic.  Eyes: Conjunctivae and EOM are normal.  Cardiovascular: Normal rate and regular rhythm.  Pulmonary/Chest: No stridor. No respiratory distress.  Crackles left lung base  Abdominal: She exhibits no distension. There is no tenderness.  Musculoskeletal: She exhibits no edema.  Neurological: She is alert and oriented to person, place, and time. No cranial nerve deficit.  Skin: Skin is warm and dry.  Psychiatric: She has a normal mood and affect.  Nursing note and vitals reviewed.    ED Treatments / Results  Labs (all labs ordered are listed, but only abnormal results are displayed) Labs Reviewed  BASIC METABOLIC PANEL - Abnormal; Notable for the following components:      Result Value   Sodium 123 (*)    Chloride 91 (*)    Glucose, Bld 108 (*)    All other components within normal limits  CBC - Abnormal; Notable for the following components:   Hemoglobin 11.4 (*)    HCT 34.3 (*)    All other components within normal limits  URINALYSIS, ROUTINE W REFLEX MICROSCOPIC  CBG MONITORING, ED    EKG  EKG Interpretation  Date/Time:  Friday March 30 2017 16:05:00 EST Ventricular Rate:  73 PR Interval:    QRS Duration: 140 QT Interval:  460 QTC Calculation: 507 R Axis:   54 Text Interpretation:  Sinus rhythm LVH with secondary repolarization abnormality ST-t wave abnormality Abnormal ekg Confirmed by Carmin Muskrat [id number]) on 03/30/2017 5:01:37 PM       Radiology Dg Chest 2 View  Result Date: 03/30/2017 CLINICAL DATA:  Decreased appetite and weakness EXAM: CHEST  2 VIEW COMPARISON:  01/09/2014 FINDINGS: Cardiac shadow is within normal limits. The lungs are well aerated bilaterally. A lobulated soft tissue density is noted in the left lung apex new from the prior exam and  highly suspicious for underlying neoplasm. Aortic calcifications are seen. No other focal abnormality is noted. IMPRESSION: Soft tissue masslike density in the left apex suspicious for underlying neoplasm. CT of the chest with contrast is recommended for further evaluation. These results were called by telephone at the time of interpretation on 03/30/2017 at 3:58 pm to Dr. Carmin Muskrat , who verbally acknowledged these results. Electronically Signed   By: Inez Catalina M.D.   On: 03/30/2017 15:58    Procedures Procedures (including critical care time)  Medications Ordered in ED Medications  0.9 %  sodium chloride infusion ( Intravenous New Bag/Given 03/30/17 1534)  albuterol (PROVENTIL) (2.5 MG/3ML) 0.083% nebulizer solution 5 mg (not administered)  Initial Impression / Assessment and Plan / ED Course  I have reviewed the triage vital signs and the nursing notes.  Pertinent labs & imaging results that were available during my care of the patient were reviewed by me and considered in my medical decision making (see chart for details).  Initial labs notable for hyponatremia will be on the patient's prior low value. With this is a likely contributor to her weakness patient was started on IV fluids.  Additional labs pending.   On repeat exam after initial results were available the patient and I discussed the abnormal x-ray, and the patient will have a CT scan performed.  I discussed the patient's x-ray results with our radiologist.  Elderly female presents with several weeks of fatigue, anorexia, early satiety. Patient's description of illnesses medially concerning for malignancy, though other possibilities including infection, ischemia or considered. Patient's findings most notable for limitation of hyponatremia as well as left upper lobe mass concerning for malignancy. Patient received fluid resuscitation in the emergency department was admitted for further evaluation and  management.  Final Clinical Impressions(s) / ED Diagnoses  Weakness Hyponatremia Lung mass, left, initial encounter   Carmin Muskrat, MD 03/30/17 1820

## 2017-03-31 ENCOUNTER — Encounter (HOSPITAL_COMMUNITY): Payer: Self-pay | Admitting: *Deleted

## 2017-03-31 ENCOUNTER — Other Ambulatory Visit: Payer: Self-pay

## 2017-03-31 DIAGNOSIS — E871 Hypo-osmolality and hyponatremia: Secondary | ICD-10-CM | POA: Diagnosis not present

## 2017-03-31 LAB — BASIC METABOLIC PANEL
ANION GAP: 7 (ref 5–15)
ANION GAP: 4 — AB (ref 5–15)
BUN: 8 mg/dL (ref 6–20)
BUN: 11 mg/dL (ref 6–20)
CALCIUM: 8.9 mg/dL (ref 8.9–10.3)
CALCIUM: 8.3 mg/dL — AB (ref 8.9–10.3)
CO2: 26 mmol/L (ref 22–32)
CO2: 24 mmol/L (ref 22–32)
CREATININE: 0.79 mg/dL (ref 0.44–1.00)
CREATININE: 0.82 mg/dL (ref 0.44–1.00)
Chloride: 94 mmol/L — ABNORMAL LOW (ref 101–111)
Chloride: 96 mmol/L — ABNORMAL LOW (ref 101–111)
Glucose, Bld: 163 mg/dL — ABNORMAL HIGH (ref 65–99)
Glucose, Bld: 127 mg/dL — ABNORMAL HIGH (ref 65–99)
Potassium: 4.1 mmol/L (ref 3.5–5.1)
Potassium: 4.4 mmol/L (ref 3.5–5.1)
SODIUM: 127 mmol/L — AB (ref 135–145)
SODIUM: 124 mmol/L — AB (ref 135–145)

## 2017-03-31 MED ORDER — ALBUTEROL SULFATE (2.5 MG/3ML) 0.083% IN NEBU
2.5000 mg | INHALATION_SOLUTION | Freq: Once | RESPIRATORY_TRACT | Status: AC
  Administered 2017-03-31: 2.5 mg via RESPIRATORY_TRACT
  Filled 2017-03-31: qty 3

## 2017-03-31 MED ORDER — ENSURE ENLIVE PO LIQD
237.0000 mL | Freq: Two times a day (BID) | ORAL | Status: DC
  Administered 2017-03-31 – 2017-04-04 (×7): 237 mL via ORAL

## 2017-03-31 NOTE — Progress Notes (Signed)
Na level 124. Called to Dr. Charlies Silvers. Continue current plan of care.

## 2017-03-31 NOTE — Progress Notes (Signed)
Initial Nutrition Assessment  DOCUMENTATION CODES:   Not applicable  INTERVENTION:   -Ensure Enlive po BID, each supplement provides 350 kcal and 20 grams of protein  NUTRITION DIAGNOSIS:   Increased nutrient needs related to catabolic illness(upper lt lob mass concerning for bronchogenic cancer) as evidenced by estimated needs.  GOAL:   Patient will meet greater than or equal to 90% of their needs   MONITOR:   PO intake, Supplement acceptance, Labs, Weight trends, Skin, I & O's  REASON FOR ASSESSMENT:   Malnutrition Screening Tool    ASSESSMENT:   [name]  is a 83 y.o. female, with past medical history significant for coronary artery disease status post MI in the past, abdominal aortic aneurysm, carotid artery occlusion and common bile duct dilatation presenting today with 3 weeks history of weakness, fatigue and dizziness. She is a current smoker. Patient denies any chest pains , shortness of breath, nausea  Pt admitted with hyponatremia with upper lobe mass (concerning for bronchogenic cancer).   Pt in with oncologist at time of visit. Unable to obtain additional hx or perform nutrition-focused physical exam at time of visit.   Per wt hx, noted UBW around 135#. Pt now weighs approximately 90% of UBW, however, no recent wt hx available to demonstrate more acute weight changes.   Given potential catabolic illness, nutritional needs are increased. RD will order supplements to help optimize intake and attempt to prove further weight loss.   Labs reviewed: Na: 127 (on IV supplementation).   Diet Order:  Diet regular Room service appropriate? Yes; Fluid consistency: Thin; Fluid restriction: 1500 mL Fluid  EDUCATION NEEDS:   Not appropriate for education at this time  Skin:  Skin Assessment: Reviewed RN Assessment  Last BM:  03/30/17  Height:   Ht Readings from Last 1 Encounters:  03/30/17 5\' 1"  (1.549 m)    Weight:   Wt Readings from Last 1 Encounters:   03/30/17 122 lb 12.7 oz (55.7 kg)    Ideal Body Weight:  47.7 kg  BMI:  Body mass index is 23.2 kg/m.  Estimated Nutritional Needs:   Kcal:  1450-1650  Protein:  65-80 grams  Fluid:  1.4-1.6 L    Ellora Varnum A. Jimmye Norman, RD, LDN, CDE Pager: [phone] After hours Pager: [phone]

## 2017-03-31 NOTE — Progress Notes (Signed)
Patient ID: [name], female   DOB: [birth year], 82 y.o.   MRN: [id number]  PROGRESS NOTE    [name]  MLY:[id number] DOB: Aug 15, 1928 DOA: 03/30/2017  PCP: Leonard Downing, MD   Brief Narrative:  83 year old female with CAD s/p MI who presented with 3 weeks weakness, fatigue and dizziness. She was found to have hyponatremia and sodium of 123. CT scan showed 6.4 cm left upper lobe lesion suspicious for bronchogenic carcinoma.   Assessment & Plan:   Active Problems:   Hyponatremia - Likely due to dehydration - Continue IV fluids - Sodium 127 --> 124 - Check BMP in am    Left upper lung lobe lesion - Outpt follow up with oncology     DVT prophylaxis: Lovenox subQ Code Status: DNR/DNI Family Communication: daughter at the bedside  Disposition Plan: home once sodium level closer to 130   Consultants:   None   Procedures:   None   Antimicrobials:   None    Subjective: No overnight events.  Objective: Vitals:   03/30/17 2001 03/31/17 0437 03/31/17 0630 03/31/17 1510  BP: (!) 176/69 (!) 170/69 (!) 144/55 (!) 163/59  Pulse: 80 76 82 74  Resp: 19 20  20   Temp: 98.2 F (36.8 C) 97.8 F (36.6 C)  98.7 F (37.1 C)  TempSrc: Oral   Oral  SpO2: 97% 95%  98%  Weight: 55.7 kg (122 lb 12.7 oz)     Height: 5\' 1"  (1.549 m)       Intake/Output Summary (Last 24 hours) at 03/31/2017 1800 Last data filed at 03/31/2017 1700 Gross per 24 hour  Intake 1910 ml  Output 1700 ml  Net 210 ml   Filed Weights   03/30/17 2001  Weight: 55.7 kg (122 lb 12.7 oz)    Examination:  General exam: Appears calm and comfortable  Respiratory system: Clear to auscultation. Respiratory effort normal. Cardiovascular system: S1 & S2 heard, RRR.  Gastrointestinal system: Abdomen is nondistended, soft and nontender. No organomegaly or masses felt. Normal bowel sounds heard. Central nervous system: Alert and oriented. No focal neurological deficits. Extremities:  Symmetric 5 x 5 power. Skin: No rashes, lesions or ulcers Psychiatry: Judgement and insight appear normal. Mood & affect appropriate.   Data Reviewed: I have personally reviewed following labs and imaging studies  CBC: Recent Labs  Lab 03/30/17 1402  WBC 10.3  HGB 11.4*  HCT 34.3*  MCV 84.9  PLT 812   Basic Metabolic Panel: Recent Labs  Lab 03/30/17 1402 03/31/17 0457 03/31/17 1529  NA 123* 127* 124*  K 4.4 4.1 4.4  CL 91* 94* 96*  CO2 24 26 24   GLUCOSE 108* 163* 127*  BUN 12 8 11   CREATININE 0.72 0.79 0.82  CALCIUM 9.0 8.9 8.3*   GFR: Estimated Creatinine Clearance: 37.2 mL/min (by C-G formula based on SCr of 0.82 mg/dL). Liver Function Tests: No results for input(s): AST, ALT, ALKPHOS, BILITOT, PROT, ALBUMIN in the last 168 hours. No results for input(s): LIPASE, AMYLASE in the last 168 hours. No results for input(s): AMMONIA in the last 168 hours. Coagulation Profile: No results for input(s): INR, PROTIME in the last 168 hours. Cardiac Enzymes: No results for input(s): CKTOTAL, CKMB, CKMBINDEX, TROPONINI in the last 168 hours. BNP (last 3 results) No results for input(s): PROBNP in the last 8760 hours. HbA1C: No results for input(s): HGBA1C in the last 72 hours. CBG: No results for input(s): GLUCAP in the last 168 hours. Lipid  Profile: No results for input(s): CHOL, HDL, LDLCALC, TRIG, CHOLHDL, LDLDIRECT in the last 72 hours. Thyroid Function Tests: Recent Labs    03/30/17 2054  TSH 1.511   Anemia Panel: No results for input(s): VITAMINB12, FOLATE, FERRITIN, TIBC, IRON, RETICCTPCT in the last 72 hours. Urine analysis:    Component Value Date/Time   COLORURINE YELLOW 03/30/2017 1750   APPEARANCEUR HAZY (A) 03/30/2017 1750   LABSPEC 1.020 03/30/2017 1750   PHURINE 7.0 03/30/2017 1750   GLUCOSEU NEGATIVE 03/30/2017 1750   HGBUR SMALL (A) 03/30/2017 1750   BILIRUBINUR NEGATIVE 03/30/2017 1750   KETONESUR NEGATIVE 03/30/2017 1750   PROTEINUR 30 (A)  03/30/2017 1750   UROBILINOGEN 0.2 01/09/2014 1448   NITRITE POSITIVE (A) 03/30/2017 1750   LEUKOCYTESUR TRACE (A) 03/30/2017 1750   Sepsis Labs: @LABRCNTIP (procalcitonin:4,lacticidven:4)   )No results found for this or any previous visit (from the past 240 hour(s)).    Radiology Studies: Dg Chest 2 View  Result Date: 03/30/2017 CLINICAL DATA:  Decreased appetite and weakness EXAM: CHEST  2 VIEW COMPARISON:  01/09/2014 FINDINGS: Cardiac shadow is within normal limits. The lungs are well aerated bilaterally. A lobulated soft tissue density is noted in the left lung apex new from the prior exam and highly suspicious for underlying neoplasm. Aortic calcifications are seen. No other focal abnormality is noted. IMPRESSION: Soft tissue masslike density in the left apex suspicious for underlying neoplasm. CT of the chest with contrast is recommended for further evaluation. These results were called by telephone at the time of interpretation on 03/30/2017 at 3:58 pm to Dr. Carmin Muskrat , who verbally acknowledged these results. Electronically Signed   By: Inez Catalina M.D.   On: 03/30/2017 15:58   Ct Chest W Contrast  Result Date: 03/30/2017 CLINICAL DATA:  New masslike opacity at the left lung apex on chest radiograph EXAM: CT CHEST WITH CONTRAST TECHNIQUE: Multidetector CT imaging of the chest was performed during intravenous contrast administration. CONTRAST:  29mL ISOVUE-300 IOPAMIDOL (ISOVUE-300) INJECTION 61% COMPARISON:  Chest radiograph from earlier today. FINDINGS: Cardiovascular: Normal heart size. No significant pericardial fluid/thickening. Left main, left anterior descending, left circumflex and right coronary atherosclerosis. Atherosclerotic nonaneurysmal thoracic aorta, noting extensive ulcerated plaque throughout the aorta. Normal caliber pulmonary arteries. No central pulmonary emboli. Mediastinum/Nodes: Subcentimeter hypodense left thyroid lobe nodule. Unremarkable esophagus. No  axillary adenopathy. Mildly enlarged 1.0 cm left paratracheal node (series 2/ image 40). Mildly enlarged 1.2 cm left suprahilar node (series 2/ image 39). No additional pathologically enlarged mediastinal or hilar nodes. Lungs/Pleura: No pneumothorax. No pleural effusion. Mild centrilobular and paraseptal emphysema. Spiculated irregular solid 6.4 x 5.3 cm posterior apical left upper lobe lung mass (series 3/image 15), with suggestion of early invasion of the left paraspinal soft tissues at the T3-4 level (series 2/ image 23 and series 4/ image 58). No definite osseous invasion. Punctate calcified granulomas in the right upper lung. Otherwise no significant pulmonary nodules. Upper abdomen: Indeterminate 1.5 cm left adrenal nodule with density 65 HU (series 2/ image 126). Musculoskeletal: No aggressive appearing focal osseous lesions. Mild thoracic spondylosis. IMPRESSION: 1. Spiculated irregular solid 6.4 cm posterior apical left upper lobe lung mass, highly suspicious for primary bronchogenic carcinoma. Suggestion of early invasion of the left paraspinal soft tissues at the T3-4 level, without appreciable osseous invasion. 2. Ipsilateral hilar and ipsilateral mediastinal lymphadenopathy, suspicious for nodal metastatic disease. 3. Indeterminate left adrenal nodule, cannot exclude left adrenal metastasis. 4. Multidisciplinary thoracic oncology consultation and PET-CT recommended . 5. Left main and  3 vessel coronary atherosclerosis. Aortic Atherosclerosis (ICD10-I70.0) and Emphysema (ICD10-J43.9). Electronically Signed   By: Ilona Sorrel M.D.   On: 03/30/2017 17:05        Scheduled Meds: . albuterol  2.5 mg Nebulization Once  . aspirin EC  81 mg Oral Daily  . enoxaparin (LOVENOX) injection  30 mg Subcutaneous Q24H  . feeding supplement (ENSURE ENLIVE)  237 mL Oral BID BM  . lisinopril  20 mg Oral Daily   Continuous Infusions: . sodium chloride 75 mL/hr at 03/31/17 1106     LOS: 0 days    Time  spent: 25 minutes  Greater than 50% of the time spent on counseling and coordinating the care.   Leisa Lenz, MD [facility] Pager [phone]  If 7PM-7AM, please contact night-coverage [email or web address] Password [email or web address] 03/31/2017, 6:00 PM

## 2017-04-01 DIAGNOSIS — Z66 Do not resuscitate: Secondary | ICD-10-CM | POA: Diagnosis present

## 2017-04-01 DIAGNOSIS — I6529 Occlusion and stenosis of unspecified carotid artery: Secondary | ICD-10-CM | POA: Diagnosis present

## 2017-04-01 DIAGNOSIS — Z720 Tobacco use: Secondary | ICD-10-CM | POA: Diagnosis not present

## 2017-04-01 DIAGNOSIS — R0989 Other specified symptoms and signs involving the circulatory and respiratory systems: Secondary | ICD-10-CM | POA: Diagnosis not present

## 2017-04-01 DIAGNOSIS — K449 Diaphragmatic hernia without obstruction or gangrene: Secondary | ICD-10-CM | POA: Diagnosis present

## 2017-04-01 DIAGNOSIS — I10 Essential (primary) hypertension: Secondary | ICD-10-CM | POA: Diagnosis present

## 2017-04-01 DIAGNOSIS — F17210 Nicotine dependence, cigarettes, uncomplicated: Secondary | ICD-10-CM | POA: Diagnosis present

## 2017-04-01 DIAGNOSIS — E785 Hyperlipidemia, unspecified: Secondary | ICD-10-CM | POA: Diagnosis present

## 2017-04-01 DIAGNOSIS — Z6823 Body mass index (BMI) 23.0-23.9, adult: Secondary | ICD-10-CM | POA: Diagnosis not present

## 2017-04-01 DIAGNOSIS — D3502 Benign neoplasm of left adrenal gland: Secondary | ICD-10-CM | POA: Diagnosis present

## 2017-04-01 DIAGNOSIS — R918 Other nonspecific abnormal finding of lung field: Secondary | ICD-10-CM | POA: Diagnosis not present

## 2017-04-01 DIAGNOSIS — D72829 Elevated white blood cell count, unspecified: Secondary | ICD-10-CM | POA: Diagnosis present

## 2017-04-01 DIAGNOSIS — Z8673 Personal history of transient ischemic attack (TIA), and cerebral infarction without residual deficits: Secondary | ICD-10-CM | POA: Diagnosis not present

## 2017-04-01 DIAGNOSIS — C3412 Malignant neoplasm of upper lobe, left bronchus or lung: Secondary | ICD-10-CM | POA: Diagnosis present

## 2017-04-01 DIAGNOSIS — Z7982 Long term (current) use of aspirin: Secondary | ICD-10-CM | POA: Diagnosis not present

## 2017-04-01 DIAGNOSIS — E871 Hypo-osmolality and hyponatremia: Secondary | ICD-10-CM | POA: Diagnosis present

## 2017-04-01 DIAGNOSIS — I25810 Atherosclerosis of coronary artery bypass graft(s) without angina pectoris: Secondary | ICD-10-CM | POA: Diagnosis not present

## 2017-04-01 DIAGNOSIS — I252 Old myocardial infarction: Secondary | ICD-10-CM | POA: Diagnosis not present

## 2017-04-01 DIAGNOSIS — K5900 Constipation, unspecified: Secondary | ICD-10-CM | POA: Diagnosis present

## 2017-04-01 DIAGNOSIS — C799 Secondary malignant neoplasm of unspecified site: Secondary | ICD-10-CM | POA: Diagnosis present

## 2017-04-01 DIAGNOSIS — D649 Anemia, unspecified: Secondary | ICD-10-CM | POA: Diagnosis present

## 2017-04-01 DIAGNOSIS — R634 Abnormal weight loss: Secondary | ICD-10-CM | POA: Diagnosis present

## 2017-04-01 DIAGNOSIS — E222 Syndrome of inappropriate secretion of antidiuretic hormone: Secondary | ICD-10-CM | POA: Diagnosis present

## 2017-04-01 DIAGNOSIS — I2510 Atherosclerotic heart disease of native coronary artery without angina pectoris: Secondary | ICD-10-CM | POA: Diagnosis not present

## 2017-04-01 DIAGNOSIS — I700 Atherosclerosis of aorta: Secondary | ICD-10-CM | POA: Diagnosis present

## 2017-04-01 DIAGNOSIS — R740 Nonspecific elevation of levels of transaminase and lactic acid dehydrogenase [LDH]: Secondary | ICD-10-CM | POA: Diagnosis not present

## 2017-04-01 DIAGNOSIS — I714 Abdominal aortic aneurysm, without rupture: Secondary | ICD-10-CM | POA: Diagnosis present

## 2017-04-01 DIAGNOSIS — Z823 Family history of stroke: Secondary | ICD-10-CM | POA: Diagnosis not present

## 2017-04-01 LAB — BASIC METABOLIC PANEL
Anion gap: 6 (ref 5–15)
Anion gap: 6 (ref 5–15)
BUN: 9 mg/dL (ref 6–20)
BUN: 9 mg/dL (ref 6–20)
CALCIUM: 8.8 mg/dL — AB (ref 8.9–10.3)
CALCIUM: 8.7 mg/dL — AB (ref 8.9–10.3)
CHLORIDE: 100 mmol/L — AB (ref 101–111)
CHLORIDE: 98 mmol/L — AB (ref 101–111)
CO2: 24 mmol/L (ref 22–32)
CO2: 24 mmol/L (ref 22–32)
CREATININE: 0.77 mg/dL (ref 0.44–1.00)
CREATININE: 0.76 mg/dL (ref 0.44–1.00)
GFR calc Af Amer: >60 mL/min (ref >60)
GFR calc non Af Amer: >60 mL/min (ref >60)
Glucose, Bld: 87 mg/dL (ref 65–99)
Glucose, Bld: 107 mg/dL — ABNORMAL HIGH (ref 65–99)
Potassium: 4 mmol/L (ref 3.5–5.1)
Potassium: 4.1 mmol/L (ref 3.5–5.1)
SODIUM: 130 mmol/L — AB (ref 135–145)
Sodium: 128 mmol/L — ABNORMAL LOW (ref 135–145)

## 2017-04-01 LAB — CORTISOL-AM, BLOOD: Cortisol - AM: 11.7 ug/dL (ref 6.7–22.6)

## 2017-04-01 LAB — TSH: TSH: 1.741 u[IU]/mL (ref 0.350–4.500)

## 2017-04-01 LAB — OSMOLALITY, URINE: Osmolality, Ur: 354 mOsm/kg (ref 300–900)

## 2017-04-01 LAB — SODIUM, URINE, RANDOM: Sodium, Ur: 104 mmol/L

## 2017-04-01 MED ORDER — HYDRALAZINE HCL 20 MG/ML IJ SOLN
5.0000 mg | INTRAMUSCULAR | Status: DC | PRN
  Administered 2017-04-02 – 2017-04-03 (×3): 5 mg via INTRAVENOUS
  Filled 2017-04-01 (×3): qty 1

## 2017-04-01 NOTE — Care Management Note (Signed)
Case Management Note Marvetta Gibbons RN, BSN Unit 4E-Case Manager-- 6N weekend coverage [phone]  Patient Details  Name: LEXANY BELKNAP MRN: [id number] Date of Birth: 10-14-1928  Subjective/Objective:    Pt presented with hyponatremia                Action/Plan: PTA pt lived at home- orders placed for HHRN/PT- spoke with pt at bedside- choice offered for [facility] agency in [employer]- per pt she has used [facility] in past and wants to use them again for [facility] services- she reports that she has both walker and BSC at home, will look into getting a cane. Referral for [facility] services called to [facility] with [facility]- referral accepted.   Expected Discharge Date:       04/01/17           Expected Discharge Plan:  [place]  In-House Referral:  NA  Discharge planning Services  CM Consult  Post Acute Care Choice:  Home Health Choice offered to:  Patient  DME Arranged:  N/A DME Agency:  NA  HH Arranged:  RN, PT [place] Agency:  [place]  Status of Service:  Completed, signed off  If discussed at [place] of Stay Meetings, dates discussed:    Discharge Disposition: Home/home health   Additional Comments:  Dawayne Patricia, RN 04/01/2017, 10:50 AM

## 2017-04-01 NOTE — Progress Notes (Addendum)
PROGRESS NOTE    [name]  PJK:[id number] DOB: 03-19-1929 DOA: 03/30/2017 PCP: Leonard Downing, MD    Brief Narrative:   83 year old female with coronary artery disease status post myocardial infarction who presented with 3 weeks of weakness, fatigue, dizziness.  Found to have hyponatremia with a sodium of 123.  CT scan showed 6.4 cm left upper lobe lesion suspicious for bronchogenic carcinoma.  Sodium was trended.  On 04/01/2017 max of 130 was reached.  On repeat draw sodium of 128  Assessment & Plan:   Active Problems:   Hyponatremia   Hyponatremia - Likely due to dehydration - Continue IV fluids - Sodium 127 --> 124--> 130-->128 -Discussed with Dr. Justin Mend of nephrology. -Will order urine osmolality, serum cortisol to be drawn in the morning, TSH -May need to consider lung biopsy if sodium fails to improve -Likely SIADH in setting of lung cancer - if SIADH consider demeclocycline 150mg  BID    Left upper lung lobe lesion -Will need follow-up outpatient -May need inpatient evaluation pending's patient's sodium   DVT prophylaxis: Lovenox subQ Code Status: DNR/DNI Family Communication: daughter at the bedside  Disposition Plan: pending improvement in sodium    Consultants:   On phone curbside consult with nephrology  Procedures:   None  Antimicrobials:   None   Subjective: Patient upset this morning stating no physician had come and spoken with her yesterday.  She states that she just wanted an explanation as to what her lung mass could be.  She also has many questions about how her sodium got to be so low.  Denies weakness tingling numbness faintness.  Denies chest pain or shortness of breath.  Denies nausea vomiting or diarrhea.  Objective: Vitals:   03/31/17 0630 03/31/17 1510 03/31/17 2129 04/01/17 0400  BP: (!) 144/55 (!) 163/59 (!) 184/68 (!) 151/60  Pulse: 82 74 76 75  Resp:  20 20 19   Temp:  98.7 F (37.1 C) 97.9 F (36.6 C) 98.2 F  (36.8 C)  TempSrc:  Oral  Oral  SpO2:  98% 94% 95%  Weight:      Height:        Intake/Output Summary (Last 24 hours) at 04/01/2017 1410 Last data filed at 04/01/2017 0357 Gross per 24 hour  Intake 420 ml  Output 2050 ml  Net -1630 ml   Filed Weights   03/30/17 2001  Weight: 55.7 kg (122 lb 12.7 oz)    Examination:  General exam: Appears calm and comfortable  Respiratory system: Clear to auscultation. Respiratory effort normal. Cardiovascular system: S1 & S2 heard, RRR. No JVD, murmurs, rubs, gallops or clicks. No pedal edema. Gastrointestinal system: Abdomen is nondistended, soft and nontender. No organomegaly or masses felt. Normal bowel sounds heard. Central nervous system: Alert and oriented. No focal neurological deficits. Extremities: Symmetric 5 x 5 power. Skin: No rashes, lesions or ulcers Psychiatry: Judgement and insight appear normal. Mood & affect appropriate.     Data Reviewed: I have personally reviewed following labs and imaging studies  CBC: Recent Labs  Lab 03/30/17 1402  WBC 10.3  HGB 11.4*  HCT 34.3*  MCV 84.9  PLT 809   Basic Metabolic Panel: Recent Labs  Lab 03/30/17 1402 03/31/17 0457 03/31/17 1529 04/01/17 0404 04/01/17 1031  NA 123* 127* 124* 130* 128*  K 4.4 4.1 4.4 4.0 4.1  CL 91* 94* 96* 100* 98*  CO2 24 26 24 24 24   GLUCOSE 108* 163* 127* 87 107*  BUN 12 8 11  9 9  CREATININE 0.72 0.79 0.82 0.77 0.76  CALCIUM 9.0 8.9 8.3* 8.8* 8.7*   GFR: Estimated Creatinine Clearance: 38.1 mL/min (by C-G formula based on SCr of 0.76 mg/dL). Liver Function Tests: No results for input(s): AST, ALT, ALKPHOS, BILITOT, PROT, ALBUMIN in the last 168 hours. No results for input(s): LIPASE, AMYLASE in the last 168 hours. No results for input(s): AMMONIA in the last 168 hours. Coagulation Profile: No results for input(s): INR, PROTIME in the last 168 hours. Cardiac Enzymes: No results for input(s): CKTOTAL, CKMB, CKMBINDEX, TROPONINI in the  last 168 hours. BNP (last 3 results) No results for input(s): PROBNP in the last 8760 hours. HbA1C: No results for input(s): HGBA1C in the last 72 hours. CBG: No results for input(s): GLUCAP in the last 168 hours. Lipid Profile: No results for input(s): CHOL, HDL, LDLCALC, TRIG, CHOLHDL, LDLDIRECT in the last 72 hours. Thyroid Function Tests: Recent Labs    03/30/17 2054  TSH 1.511   Anemia Panel: No results for input(s): VITAMINB12, FOLATE, FERRITIN, TIBC, IRON, RETICCTPCT in the last 72 hours. Sepsis Labs: No results for input(s): PROCALCITON, LATICACIDVEN in the last 168 hours.  No results found for this or any previous visit (from the past 240 hour(s)).       Radiology Studies: Dg Chest 2 View  Result Date: 03/30/2017 CLINICAL DATA:  Decreased appetite and weakness EXAM: CHEST  2 VIEW COMPARISON:  01/09/2014 FINDINGS: Cardiac shadow is within normal limits. The lungs are well aerated bilaterally. A lobulated soft tissue density is noted in the left lung apex new from the prior exam and highly suspicious for underlying neoplasm. Aortic calcifications are seen. No other focal abnormality is noted. IMPRESSION: Soft tissue masslike density in the left apex suspicious for underlying neoplasm. CT of the chest with contrast is recommended for further evaluation. These results were called by telephone at the time of interpretation on 03/30/2017 at 3:58 pm to Dr. Carmin Muskrat , who verbally acknowledged these results. Electronically Signed   By: Inez Catalina M.D.   On: 03/30/2017 15:58   Ct Chest W Contrast  Result Date: 03/30/2017 CLINICAL DATA:  New masslike opacity at the left lung apex on chest radiograph EXAM: CT CHEST WITH CONTRAST TECHNIQUE: Multidetector CT imaging of the chest was performed during intravenous contrast administration. CONTRAST:  59mL ISOVUE-300 IOPAMIDOL (ISOVUE-300) INJECTION 61% COMPARISON:  Chest radiograph from earlier today. FINDINGS: Cardiovascular: Normal  heart size. No significant pericardial fluid/thickening. Left main, left anterior descending, left circumflex and right coronary atherosclerosis. Atherosclerotic nonaneurysmal thoracic aorta, noting extensive ulcerated plaque throughout the aorta. Normal caliber pulmonary arteries. No central pulmonary emboli. Mediastinum/Nodes: Subcentimeter hypodense left thyroid lobe nodule. Unremarkable esophagus. No axillary adenopathy. Mildly enlarged 1.0 cm left paratracheal node (series 2/ image 40). Mildly enlarged 1.2 cm left suprahilar node (series 2/ image 39). No additional pathologically enlarged mediastinal or hilar nodes. Lungs/Pleura: No pneumothorax. No pleural effusion. Mild centrilobular and paraseptal emphysema. Spiculated irregular solid 6.4 x 5.3 cm posterior apical left upper lobe lung mass (series 3/image 15), with suggestion of early invasion of the left paraspinal soft tissues at the T3-4 level (series 2/ image 23 and series 4/ image 58). No definite osseous invasion. Punctate calcified granulomas in the right upper lung. Otherwise no significant pulmonary nodules. Upper abdomen: Indeterminate 1.5 cm left adrenal nodule with density 65 HU (series 2/ image 126). Musculoskeletal: No aggressive appearing focal osseous lesions. Mild thoracic spondylosis. IMPRESSION: 1. Spiculated irregular solid 6.4 cm posterior apical left upper  lobe lung mass, highly suspicious for primary bronchogenic carcinoma. Suggestion of early invasion of the left paraspinal soft tissues at the T3-4 level, without appreciable osseous invasion. 2. Ipsilateral hilar and ipsilateral mediastinal lymphadenopathy, suspicious for nodal metastatic disease. 3. Indeterminate left adrenal nodule, cannot exclude left adrenal metastasis. 4. Multidisciplinary thoracic oncology consultation and PET-CT recommended . 5. Left main and 3 vessel coronary atherosclerosis. Aortic Atherosclerosis (ICD10-I70.0) and Emphysema (ICD10-J43.9). Electronically  Signed   By: Ilona Sorrel M.D.   On: 03/30/2017 17:05        Scheduled Meds: . aspirin EC  81 mg Oral Daily  . enoxaparin (LOVENOX) injection  30 mg Subcutaneous Q24H  . feeding supplement (ENSURE ENLIVE)  237 mL Oral BID BM  . lisinopril  20 mg Oral Daily   Continuous Infusions: . sodium chloride 75 mL/hr at 03/31/17 1106     LOS: 0 days    Time spent: 40 minutes    Loretha Stapler, MD [facility] Pager [phone]  If 7PM-7AM, please contact night-coverage [email or web address] Password [email or web address] 04/01/2017, 2:10 PM

## 2017-04-02 ENCOUNTER — Telehealth: Payer: Self-pay | Admitting: *Deleted

## 2017-04-02 DIAGNOSIS — D649 Anemia, unspecified: Secondary | ICD-10-CM

## 2017-04-02 DIAGNOSIS — I2510 Atherosclerotic heart disease of native coronary artery without angina pectoris: Secondary | ICD-10-CM

## 2017-04-02 DIAGNOSIS — E785 Hyperlipidemia, unspecified: Secondary | ICD-10-CM

## 2017-04-02 DIAGNOSIS — D72829 Elevated white blood cell count, unspecified: Secondary | ICD-10-CM

## 2017-04-02 DIAGNOSIS — R7401 Elevation of levels of liver transaminase levels: Secondary | ICD-10-CM

## 2017-04-02 DIAGNOSIS — Z720 Tobacco use: Secondary | ICD-10-CM

## 2017-04-02 DIAGNOSIS — E222 Syndrome of inappropriate secretion of antidiuretic hormone: Secondary | ICD-10-CM

## 2017-04-02 DIAGNOSIS — I25810 Atherosclerosis of coronary artery bypass graft(s) without angina pectoris: Secondary | ICD-10-CM

## 2017-04-02 DIAGNOSIS — R918 Other nonspecific abnormal finding of lung field: Secondary | ICD-10-CM

## 2017-04-02 DIAGNOSIS — R740 Nonspecific elevation of levels of transaminase and lactic acid dehydrogenase [LDH]: Secondary | ICD-10-CM

## 2017-04-02 LAB — COMPREHENSIVE METABOLIC PANEL
ALBUMIN: 2.7 g/dL — AB (ref 3.5–5.0)
ALK PHOS: 143 U/L — AB (ref 38–126)
ALT: 18 U/L (ref 14–54)
ANION GAP: 9 (ref 5–15)
AST: 43 U/L — ABNORMAL HIGH (ref 15–41)
BUN: 7 mg/dL (ref 6–20)
CALCIUM: 8.9 mg/dL (ref 8.9–10.3)
CHLORIDE: 96 mmol/L — AB (ref 101–111)
CO2: 23 mmol/L (ref 22–32)
Creatinine, Ser: 0.7 mg/dL (ref 0.44–1.00)
GFR calc non Af Amer: >60 mL/min (ref >60)
GLUCOSE: 166 mg/dL — AB (ref 65–99)
POTASSIUM: 3.5 mmol/L (ref 3.5–5.1)
SODIUM: 128 mmol/L — AB (ref 135–145)
Total Bilirubin: 0.8 mg/dL (ref 0.3–1.2)
Total Protein: 6.5 g/dL (ref 6.5–8.1)

## 2017-04-02 LAB — PHOSPHORUS: PHOSPHORUS: 2.5 mg/dL (ref 2.5–4.6)

## 2017-04-02 LAB — CBC WITH DIFFERENTIAL/PLATELET
BASOS PCT: 1 %
Basophils Absolute: 0.1 10*3/uL (ref 0.0–0.1)
Eosinophils Absolute: 0.2 10*3/uL (ref 0.0–0.7)
Eosinophils Relative: 2 %
HCT: 34.4 % — ABNORMAL LOW (ref 36.0–46.0)
HEMOGLOBIN: 11.4 g/dL — AB (ref 12.0–15.0)
LYMPHS ABS: 1.1 10*3/uL (ref 0.7–4.0)
Lymphocytes Relative: 10 %
MCH: 28.4 pg (ref 26.0–34.0)
MCHC: 33.1 g/dL (ref 30.0–36.0)
MCV: 85.8 fL (ref 78.0–100.0)
MONOS PCT: 9 %
Monocytes Absolute: 1 10*3/uL (ref 0.1–1.0)
NEUTROS PCT: 78 %
Neutro Abs: 8.6 10*3/uL — ABNORMAL HIGH (ref 1.7–7.7)
PLATELETS: 378 10*3/uL (ref 150–400)
RBC: 4.01 MIL/uL (ref 3.87–5.11)
RDW: 13.7 % (ref 11.5–15.5)
WBC: 10.8 10*3/uL — ABNORMAL HIGH (ref 4.0–10.5)

## 2017-04-02 LAB — MAGNESIUM: Magnesium: 1.7 mg/dL (ref 1.7–2.4)

## 2017-04-02 MED ORDER — ALBUTEROL SULFATE (2.5 MG/3ML) 0.083% IN NEBU: 2.5000 mg | INHALATION_SOLUTION | Freq: Four times a day (QID) | RESPIRATORY_TRACT | Status: DC | PRN

## 2017-04-02 NOTE — Consult Note (Signed)
Name: [name] MRN: [id number] DOB: [birth year]    ADMISSION DATE:  03/30/2017 CONSULTATION DATE:  11/12  REFERRING MD :  Alfredia Ferguson ([facility])   CHIEF COMPLAINT:  Lung mass   BRIEF PATIENT DESCRIPTION: 83 year old female active smoker with history of CAD, AAA, hyperlipidemia who presented on 11/9 with 3-week history of weakness, fatigue, dizziness, poor p.o. intake and 9 pound weight loss.  CT chest performed in the emergency room showed 6.4 cm left upper lobe lung lesion.  ER workup also revealed significant hyponatremia.  She was admitted by [facility] with suspicion for SIADH in the setting of probable lung cancer.  She was initially scheduled for outpatient pulmonary follow-up for possible biopsy, but has had ongoing issues with hyponatremia and PCCM consulted.   SIGNIFICANT EVENTS    STUDIES:  CT chest 11/9>>> 1. Spiculated irregular solid 6.4 cm posterior apical left upper lobe lung mass, highly suspicious for primary bronchogenic carcinoma. Suggestion of early invasion of the left paraspinal soft tissues at the T3-4 level, without appreciable osseous invasion. 2. Ipsilateral hilar and ipsilateral mediastinal lymphadenopathy, suspicious for nodal metastatic disease. 3. Indeterminate left adrenal nodule, cannot exclude left adrenal metastasis. 4. Multidisciplinary thoracic oncology consultation and PET-CT recommended . 5. Left main and 3 vessel coronary atherosclerosis.   HISTORY OF PRESENT ILLNESS:  83 year old female active smoker with history of CAD, AAA, hyperlipidemia who presented on 11/9 with 3-week history of weakness, fatigue, dizziness, poor p.o. intake and 9 pound weight loss.  CT chest performed in the emergency room showed 6.4 cm left upper lobe lung lesion.  ER workup also revealed significant hyponatremia.  She was admitted by [facility] with suspicion for SIADH in the setting of probable lung cancer.  She was initially scheduled for outpatient pulmonary follow-up for  possible biopsy, but has had ongoing issues with hyponatremia and PCCM consulted.   Currently feeling anxious and ready to decide how we are going to take a biopsy.  She is anxious to go home.  She wants to know if we can schedule some sort of biopsy today. She denies cough, shortness of breath, hemoptysis.  Does endorse weight loss as stated above.  PAST MEDICAL HISTORY :   has a past medical history of Abdominal aortic atherosclerosis ([place]), CAD (coronary artery disease), Carotid artery occlusion, Common bile duct dilation, Gallstones, Headache(784.0), Hiatal hernia, HLD (hyperlipidemia), Myocardial infarction ([place]), PONV (postoperative nausea and vomiting), and Stroke ([place]) (2003).  has a past surgical history that includes Abdominal hysterectomy; Rotator cuff repair (Left); Tonsillectomy; Bladder surgery; Appendectomy; Ankle fracture surgery; RIGHT HIP HEMIARTHROPLASTY (Right, 01/10/2014); and LAPAROSCOPIC CHOLECYSTECTOMY WITH INTRAOPERATIVE CHOLANGIOGRAM (N/A, 05/05/2013). Prior to Admission medications   Medication Sig Start Date End Date Taking? Authorizing Provider  aspirin 81 MG EC tablet Take 81 mg by mouth daily.     Yes [provider]  Calcium Carbonate-Vitamin D (CALCIUM + D PO) Take 1 tablet by mouth daily.   Yes [provider]  Glucosamine-Chondroitin (GLUCOSAMINE CHONDR COMPLEX PO) Take 1 capsule by mouth daily.   Yes [provider]  lisinopril (PRINIVIL,ZESTRIL) 20 MG tablet  07/31/16  Yes [provider]  Multiple Vitamin (MULTIVITAMIN WITH MINERALS) TABS tablet Take 1 tablet by mouth at bedtime.   Yes [provider]  nitroGLYCERIN (NITROSTAT) 0.4 MG SL tablet Place 0.4 mg under the tongue every 5 (five) minutes as needed for chest pain.    [provider]   Allergies  Allergen Reactions  . Dextroamphetamine Sulfate Er Other (See Comments)  Euphoric feeling  . Other     Other reaction(s): Other (See Comments) Euphoric  feeling    FAMILY HISTORY:  family history includes Breast cancer in her maternal aunt; Prostate cancer in her maternal grandfather; Stroke in her mother. SOCIAL HISTORY:  reports that she has been smoking cigarettes.  she has never used smokeless tobacco. She reports that she does not drink alcohol or use drugs.  REVIEW OF SYSTEMS:   As per HPI - All other systems reviewed and were neg.    SUBJECTIVE:   VITAL SIGNS: Temp:  [98 F (36.7 C)-98.8 F (37.1 C)] 98 F (36.7 C) (11/12 0400) Pulse Rate:  [73-87] 87 (11/12 0851) Resp:  [18-21] 18 (11/12 0400) BP: (154-186)/(56-89) 158/56 (11/12 0851) SpO2:  [94 %-98 %] 94 % (11/12 0851)  PHYSICAL EXAMINATION: General: Thin, chronically ill-appearing female, does not appear as old as stated age Neuro: Awake, alert, appropriate, angry at times HEENT: MM moist Cardiovascular: s1 S2 regular rate and rhythm Lungs: Abrasions are even and nonlabored, bronchial breath sounds on the left, otherwise essentially clear Abdomen: Soft, nontender Musculoskeletal: Dry no edema   Recent Labs  Lab 04/01/17 0404 04/01/17 1031 04/02/17 0908  NA 130* 128* 128*  K 4.0 4.1 3.5  CL 100* 98* 96*  CO2 24 24 23   BUN 9 9 7   CREATININE 0.77 0.76 0.70  GLUCOSE 87 107* 166*   Recent Labs  Lab 03/30/17 1402 04/02/17 0908  HGB 11.4* 11.4*  HCT 34.3* 34.4*  WBC 10.3 10.8*  PLT 389 378   No results found.  ASSESSMENT / PLAN:  Lung mass -6.4 x 5.3 cm posterior apical left upper lobe spiculated nodule with associated spinal lymphadenopathy.  CT scan also suggests some early invasion of paraspinal tissues without obvious osseous metastasis.  Also questionable adrenal metastasis noted on CT.  Highly suspicious for metastatic lung cancer in this longtime smoker.  REC -  Would recommend IR needle biopsy of left upper lobe mass Discussed this at length with patient, showed her CT images of mass.  All questions answered.   PCCM signing off, please  call back if needed.    Nickolas Madrid, NP 04/02/2017  11:23 AM Pager: [phone] or [phone]  STAFF NOTE: I, Merrie Roof, MD FACP have personally reviewed patient's available data, including medical history, events of note, physical examination and test results as part of my evaluation. I have discussed with resident/NP and other care providers such as pharmacist, RN and RRT. In addition, I personally evaluated patient and elicited key findings of: awake, alert, no distress in chair, follows commands, perrl, has supraclavicular, node left with mobility and soft overall?, has egophony left apical and clear cut bronchial BS, , abdo soft, no r/g, no edema, CT I reviewed shows large mass Left upper lobe against pleaura, highly suspicious for primary bronchogenic cancer, her functional status is good at baseline, this mass abutts against chest wall and would suggest CT guided bx, navigation bronch would put her at risk under anestheisa and higher risk complications respiratory, she also seems to have inappropriate concentration of sodium in setting hyponatremia c/w SIADH, lasix as needed , follow na trend, NPO midnight, ensure coags wnl, I updated pt in full and she understands importance of tissue bx, she is slightly reluctant to think about treatment and is concerned about treatment success and considering herself paliation approach  Lavon Paganini. Titus Mould, MD, [place] Pgr: [facility] 04/02/2017 1:12 PM

## 2017-04-02 NOTE — Progress Notes (Signed)
PROGRESS NOTE    [name]  VOZ:[id number] DOB: 1928/08/12 DOA: 03/30/2017 PCP: Leonard Downing, MD   Brief Narrative:  The patient 83 year old female with CAD s/p MI, HLD, HTN, and other comorbids who presented with 3 weeks of weakness, fatigue, dizziness.  Found to have hyponatremia with a sodium of 123.  CT scan showed 6.4 cm left upper lobe lesion suspicious for bronchogenic carcinoma  Sodium was trended.  On 04/01/2017 max of 130 was reached. On repeat draw sodium of 128. Discussed case with Oncology who recommended Inpatient Biopsy. Pulmonary Consulted who recommended IR CT Guided Biopsy. IR to Evaluate. Case was also discussed with Nephrology who recommended stopping IVF and giving the patient a High Protein Diet and trend Sodium in AM.   Assessment & Plan:   Active Problems:   HLD (hyperlipidemia)   Tobacco abuse   CAD (coronary artery disease)   Hyponatremia   SIADH (syndrome of inappropriate ADH production) (HCC)   Mass of upper lobe of left lung   Elevated AST (SGOT)   Leukocytosis   Normocytic anemia  Hyponatremia 2/2 SIADH likely from Left Upper Lobe Lung Mass -Stop IVF; Now D/C'd; Fluid Restrict to 1200 mL/day -Give High Protein Diet; Will Consult Nutrition  -C/w Ensure Enlive 237 mL po BID -Sodium 127 -> 124 -> 130 -> 128 -Discussed with Dr. Pearson Grippe informally; If not improving with Fluid Restriction and High Protein Diet will consult formally -Cortisol Level this AM was 11.7 -TSH was 1.741 -Urine Osmolality consistent with SIADH -Patient is -583 mL -Discussed with Oncology Dr. Julieanne Manson who recommends Lung Mass Biopsy inpatient as potentially Small Cell Lung Cancer -Repeat CMP in AM   Left Upper Lobe Lung Mass with likely Metastasis -CXR showed cardiac shadow is within normal limits. The lungs are well aerated bilaterally. A lobulated soft tissue density is noted in the left lung apex new from the prior exam and highly suspicious for  underlying neoplasm. Aortic calcifications are seen. No other focal abnormality is noted -CT of Chest showed spiculated irregular solid 6.4 cm posterior apical left upper lobe lung mass, highly suspicious for primary bronchogenic carcinoma. Suggestion of early invasion of the left paraspinal soft tissues at the T3-4 level, without appreciable osseous invasion. Ipsilateral hilar and ipsilateral mediastinal lymphadenopathy, suspicious for nodal metastatic disease. Indeterminate left adrenal nodule, cannot exclude left adrenal metastasis. -Discussed with Oncology Dr. Julieanne Manson who recommended Inpatient Biopsy and will follow patient in Oncology Clinic later this week after Biopsy by PCCM or IR CT Biopsy  -Pulmonary Dr. Titus Mould consulted for further evaluation and management and for potential Lung Mass Biopsy  -IR Consulted by Pulmonary for Lung Biopsy. IR to work up and schedule biopsy; Likely could be arranged as an outpatient if   CAD with Hx of MI -C/w ASA 81 mg po Daily, Lisinopril 20 mg po Daily and NTG 0.4 mg SL q59min prn CP  HLD -Check Lipid Panel in AM -Not on Statin from [facility]  Tobacco Abuse  -Smoking Cessastion Counseling given -Restart Albuterol 2.5 mg q6hprn   Elevated AST -Mild -AST was 43 -Continue to Monitor and Repeat CMP in AM   Leukocytosis -Mild at 10.8 -Continue to Monitor for S/Sx of Infection -Repeat CBC in AM   Normocytic Anemia -Patient's Hb/Hct stable at 11.4/34.4 -Continue to Monitor for S/Sx of Bleeding -Repeat CBC in AM   DVT prophylaxis: Enoxaparin 30 mg sq q24h Code Status: FULL CODE Family Communication: No family present  Disposition Plan: Anticipate D/C  within 24-48 hours pending improvement in Sodium and Lung Mass Biopsy  Consultants:   Pulmonary Critical Care Medicine  Discussed Case with Oncology Dr. Julieanne Manson  Discussed Case with Nephrology Dr. Pearson Grippe  Interventional Radiology    Procedures:  None   Antimicrobials: Anti-infectives (From admission, onward)   None     Subjective: Seen and examined at bedside and was very frustrated and wanting to go home. Discussed with her about options and after I talked to Oncology, they recommended inpatient biopsy so she is willing to stay for biopsy. No CP or SOB. No lightheadedness or dizziness.   Objective: Vitals:   04/01/17 2200 04/02/17 0136 04/02/17 0400 04/02/17 0851  BP: (!) 186/89 (!) 160/87 (!) 154/66 (!) 158/56  Pulse:   87 87  Resp:   18   Temp:   98 F (36.7 C)   TempSrc:      SpO2:   96% 94%  Weight:      Height:        Intake/Output Summary (Last 24 hours) at 04/02/2017 1230 Last data filed at 04/02/2017 1122 Gross per 24 hour  Intake 2537.5 ml  Output 1400 ml  Net 1137.5 ml   Filed Weights   03/30/17 2001  Weight: 55.7 kg (122 lb 12.7 oz)   Examination: Physical Exam:  Constitutional: Thin Caucasian female in NAD and appears agitated  Eyes: Lids and conjunctivae normal, sclerae anicteric  ENMT: External Ears, Nose appear normal. Grossly normal hearing. Mucous membranes are moist.  Neck: Appears normal, supple, no cervical masses, normal ROM, no appreciable thyromegaly, no JVD Respiratory: Diminished to auscultation especially in Left Upper Lobe, no appreciable wheezing, rales, rhonchi or crackles. Normal respiratory effort and patient is not tachypenic. No accessory muscle use.  Cardiovascular: RRR, no murmurs / rubs / gallops. S1 and S2 auscultated. No extremity edema.  Abdomen: Soft, non-tender, non-distended. No masses palpated. No appreciable hepatosplenomegaly. Bowel sounds positive x4.  GU: Deferred. Musculoskeletal: No clubbing / cyanosis of digits/nails. No joint deformity upper and lower extremities. Good ROM, no contractures.  Skin: No rashes, lesions, ulcers on a limited skin eval. No induration; Warm and dry.  Neurologic: CN 2-12 grossly intact with no focal deficits. Romberg sign cerebellar  reflexes not assessed.  Psychiatric: Normal judgment and insight. Alert and oriented x 3. Agitated mood and appropriate affect.   Data Reviewed: I have personally reviewed following labs and imaging studies  CBC: Recent Labs  Lab 03/30/17 1402 04/02/17 0908  WBC 10.3 10.8*  NEUTROABS  --  8.6*  HGB 11.4* 11.4*  HCT 34.3* 34.4*  MCV 84.9 85.8  PLT 389 681   Basic Metabolic Panel: Recent Labs  Lab 03/31/17 0457 03/31/17 1529 04/01/17 0404 04/01/17 1031 04/02/17 0908  NA 127* 124* 130* 128* 128*  K 4.1 4.4 4.0 4.1 3.5  CL 94* 96* 100* 98* 96*  CO2 26 24 24 24 23   GLUCOSE 163* 127* 87 107* 166*  BUN 8 11 9 9 7   CREATININE 0.79 0.82 0.77 0.76 0.70  CALCIUM 8.9 8.3* 8.8* 8.7* 8.9  MG  --   --   --   --  1.7  PHOS  --   --   --   --  2.5   GFR: Estimated Creatinine Clearance: 38.1 mL/min (by C-G formula based on SCr of 0.7 mg/dL). Liver Function Tests: Recent Labs  Lab 04/02/17 0908  AST 43*  ALT 18  ALKPHOS 143*  BILITOT 0.8  PROT 6.5  ALBUMIN  2.7*   No results for input(s): LIPASE, AMYLASE in the last 168 hours. No results for input(s): AMMONIA in the last 168 hours. Coagulation Profile: No results for input(s): INR, PROTIME in the last 168 hours. Cardiac Enzymes: No results for input(s): CKTOTAL, CKMB, CKMBINDEX, TROPONINI in the last 168 hours. BNP (last 3 results) No results for input(s): PROBNP in the last 8760 hours. HbA1C: No results for input(s): HGBA1C in the last 72 hours. CBG: No results for input(s): GLUCAP in the last 168 hours. Lipid Profile: No results for input(s): CHOL, HDL, LDLCALC, TRIG, CHOLHDL, LDLDIRECT in the last 72 hours. Thyroid Function Tests: Recent Labs    04/01/17 1515  TSH 1.741   Anemia Panel: No results for input(s): VITAMINB12, FOLATE, FERRITIN, TIBC, IRON, RETICCTPCT in the last 72 hours. Sepsis Labs: No results for input(s): PROCALCITON, LATICACIDVEN in the last 168 hours.  No results found for this or any  previous visit (from the past 240 hour(s)).   Radiology Studies: No results found.  Scheduled Meds: . aspirin EC  81 mg Oral Daily  . enoxaparin (LOVENOX) injection  30 mg Subcutaneous Q24H  . feeding supplement (ENSURE ENLIVE)  237 mL Oral BID BM  . lisinopril  20 mg Oral Daily   Continuous Infusions:   LOS: 1 day   Kerney Elbe, DO [facility] Pager [phone]  If 7PM-7AM, please contact night-coverage [email or web address] Password [email or web address] 04/02/2017, 12:30 PM

## 2017-04-02 NOTE — Telephone Encounter (Signed)
Oncology Nurse Navigator Documentation  Oncology Nurse Navigator Flowsheets 04/02/2017  Navigator Location [facility]  Referral date to RadOnc/MedOnc 04/02/2017  Navigator Encounter Type Telephone;Other/I received referral on [name] today. She is in the hospital. I called and spoke with her nurse. Treatment plan for her is to get biopsy by bronchoscopy or IR CT biopsy.  I updated nurse patient will be scheduled after biopsy.  She will update patient.   Patient Visit Type Inpatient  Treatment Phase Abnormal Scans  Barriers/Navigation Needs Coordination of Care  Interventions Coordination of Care  Coordination of Care Other  Acuity Level 2  Time Spent with Patient 30

## 2017-04-02 NOTE — Progress Notes (Signed)
Pt BP was 186/89 on call MD Dr. Hal Hope ordered hydralazine 5 mg and rechecked BP 160/87, asymptomatic, no complain of pain, I offered to let her walk to the hallway she changed her mind and refused.

## 2017-04-02 NOTE — Progress Notes (Signed)
Hinton Dyer from [facility] called to update patient that she would be followed up on once the biopsy of her lung is completed. They would further instruct her from that point. Will continue to monitor.

## 2017-04-02 NOTE — Consult Note (Signed)
Chief Complaint: Patient was seen in consultation today for tissue diagnosis Chief Complaint  Patient presents with  . Weakness   at the request of Dr Alfredia Ferguson  Supervising Physician: Aletta Edouard  Patient Status: [facility] - In-pt  History of Present Illness: [name] is a 83 y.o. female   CAD/MI; HLD; HTN Weakness; dizziness; fatigue Hyponatremia (sodium 123 CT: 11/9 IMPRESSION: 1. Spiculated irregular solid 6.4 cm posterior apical left upper lobe lung mass, highly suspicious for primary bronchogenic carcinoma. Suggestion of early invasion of the left paraspinal soft tissues at the T3-4 level, without appreciable osseous invasion. 2. Ipsilateral hilar and ipsilateral mediastinal lymphadenopathy, suspicious for nodal metastatic disease. 3. Indeterminate left adrenal nodule, cannot exclude left adrenal metastasis. 4. Multidisciplinary thoracic oncology consultation and PET-CT recommended . 5. Left main and 3 vessel coronary atherosclerosis.  TRH discussed with Oncology Rec Bx Lung mass prior to DC To see Oncology MD later this week  Reviewed imaging with Dr Kathlene Cote Rec: CT Abd/Pel with IV Cx (ordered) Will determine site of biopsy when imaging all in place Pt is aware and agreeable Biopsy likely Wed 11/14  Past Medical History:  Diagnosis Date  . Abdominal aortic atherosclerosis ([place])   . CAD (coronary artery disease)   . Carotid artery occlusion   . Common bile duct dilation   . Gallstones   . Headache(784.0)   . Hiatal hernia   . HLD (hyperlipidemia)   . Myocardial infarction ([place])   . PONV (postoperative nausea and vomiting)   . Stroke [facility]) 2003    Past Surgical History:  Procedure Laterality Date  . ABDOMINAL HYSTERECTOMY    . ANKLE FRACTURE SURGERY    . APPENDECTOMY    . BLADDER SURGERY    . ROTATOR CUFF REPAIR Left   . TONSILLECTOMY      Allergies: Dextroamphetamine sulfate er and Other  Medications: Prior to Admission  medications   Medication Sig Start Date End Date Taking? Authorizing Provider  aspirin 81 MG EC tablet Take 81 mg by mouth daily.     Yes [provider]  Calcium Carbonate-Vitamin D (CALCIUM + D PO) Take 1 tablet by mouth daily.   Yes [provider]  Glucosamine-Chondroitin (GLUCOSAMINE CHONDR COMPLEX PO) Take 1 capsule by mouth daily.   Yes [provider]  lisinopril (PRINIVIL,ZESTRIL) 20 MG tablet  07/31/16  Yes [provider]  Multiple Vitamin (MULTIVITAMIN WITH MINERALS) TABS tablet Take 1 tablet by mouth at bedtime.   Yes [provider]  nitroGLYCERIN (NITROSTAT) 0.4 MG SL tablet Place 0.4 mg under the tongue every 5 (five) minutes as needed for chest pain.    [provider]     Family History  Problem Relation Age of Onset  . Stroke Mother   . Breast cancer Maternal Aunt   . Prostate cancer Maternal Grandfather     Social History   Socioeconomic History  . Marital status: Widowed    Spouse name: None  . Number of children: 3  . Years of education: None  . Highest education level: None  Social Needs  . Financial resource strain: None  . Food insecurity - worry: None  . Food insecurity - inability: None  . Transportation needs - medical: None  . Transportation needs - non-medical: None  Occupational History  . Occupation: Retired  Tobacco Use  . Smoking status: Current Every Day Smoker    Types: Cigarettes  . Smokeless tobacco: Never Used  Substance and Sexual Activity  .  Alcohol use: No  . Drug use: No  . Sexual activity: None  Other Topics Concern  . None  Social History Narrative   The patient is married with 3 adult sons   Retired   4 cups of caffeine daily   Originally from [place]   03/20/2013          Review of Systems: A 12 point ROS discussed and pertinent positives are indicated in the HPI above.  All other systems are negative.  Review of Systems  Constitutional: Positive for activity  change, appetite change, fatigue and unexpected weight change. Negative for fever.  Respiratory: Positive for shortness of breath.   Cardiovascular: Negative for chest pain.  Gastrointestinal: Positive for nausea. Negative for abdominal pain.  Neurological: Positive for weakness.  Psychiatric/Behavioral: Negative for behavioral problems and confusion.    Vital Signs: BP (!) 158/56 (BP Location: Right Arm)   Pulse 87   Temp 98 F (36.7 C)   Resp 18   Ht 5\' 1"  (1.549 m)   Wt 122 lb 12.7 oz (55.7 kg)   SpO2 94%   BMI 23.20 kg/m   Physical Exam  Constitutional: She is oriented to person, place, and time.  Cardiovascular: Normal rate and regular rhythm.  Pulmonary/Chest: Effort normal. She has wheezes.  Abdominal: Soft. Bowel sounds are normal.  Musculoskeletal: Normal range of motion.  Neurological: She is alert and oriented to person, place, and time.  Skin: Skin is warm and dry.  Psychiatric: She has a normal mood and affect. Her behavior is normal. Judgment and thought content normal.  Nursing note and vitals reviewed.   Imaging: Dg Chest 2 View  Result Date: 03/30/2017 CLINICAL DATA:  Decreased appetite and weakness EXAM: CHEST  2 VIEW COMPARISON:  01/09/2014 FINDINGS: Cardiac shadow is within normal limits. The lungs are well aerated bilaterally. A lobulated soft tissue density is noted in the left lung apex new from the prior exam and highly suspicious for underlying neoplasm. Aortic calcifications are seen. No other focal abnormality is noted. IMPRESSION: Soft tissue masslike density in the left apex suspicious for underlying neoplasm. CT of the chest with contrast is recommended for further evaluation. These results were called by telephone at the time of interpretation on 03/30/2017 at 3:58 pm to Dr. Carmin Muskrat , who verbally acknowledged these results. Electronically Signed   By: Inez Catalina M.D.   On: 03/30/2017 15:58   Ct Chest W Contrast  Result Date:  03/30/2017 CLINICAL DATA:  New masslike opacity at the left lung apex on chest radiograph EXAM: CT CHEST WITH CONTRAST TECHNIQUE: Multidetector CT imaging of the chest was performed during intravenous contrast administration. CONTRAST:  23mL ISOVUE-300 IOPAMIDOL (ISOVUE-300) INJECTION 61% COMPARISON:  Chest radiograph from earlier today. FINDINGS: Cardiovascular: Normal heart size. No significant pericardial fluid/thickening. Left main, left anterior descending, left circumflex and right coronary atherosclerosis. Atherosclerotic nonaneurysmal thoracic aorta, noting extensive ulcerated plaque throughout the aorta. Normal caliber pulmonary arteries. No central pulmonary emboli. Mediastinum/Nodes: Subcentimeter hypodense left thyroid lobe nodule. Unremarkable esophagus. No axillary adenopathy. Mildly enlarged 1.0 cm left paratracheal node (series 2/ image 40). Mildly enlarged 1.2 cm left suprahilar node (series 2/ image 39). No additional pathologically enlarged mediastinal or hilar nodes. Lungs/Pleura: No pneumothorax. No pleural effusion. Mild centrilobular and paraseptal emphysema. Spiculated irregular solid 6.4 x 5.3 cm posterior apical left upper lobe lung mass (series 3/image 15), with suggestion of early invasion of the left paraspinal soft tissues at the T3-4 level (series 2/ image 23 and  series 4/ image 58). No definite osseous invasion. Punctate calcified granulomas in the right upper lung. Otherwise no significant pulmonary nodules. Upper abdomen: Indeterminate 1.5 cm left adrenal nodule with density 65 HU (series 2/ image 126). Musculoskeletal: No aggressive appearing focal osseous lesions. Mild thoracic spondylosis. IMPRESSION: 1. Spiculated irregular solid 6.4 cm posterior apical left upper lobe lung mass, highly suspicious for primary bronchogenic carcinoma. Suggestion of early invasion of the left paraspinal soft tissues at the T3-4 level, without appreciable osseous invasion. 2. Ipsilateral hilar and  ipsilateral mediastinal lymphadenopathy, suspicious for nodal metastatic disease. 3. Indeterminate left adrenal nodule, cannot exclude left adrenal metastasis. 4. Multidisciplinary thoracic oncology consultation and PET-CT recommended . 5. Left main and 3 vessel coronary atherosclerosis. Aortic Atherosclerosis (ICD10-I70.0) and Emphysema (ICD10-J43.9). Electronically Signed   By: Ilona Sorrel M.D.   On: 03/30/2017 17:05    Labs:  CBC: Recent Labs    03/30/17 1402 04/02/17 0908  WBC 10.3 10.8*  HGB 11.4* 11.4*  HCT 34.3* 34.4*  PLT 389 378    COAGS: No results for input(s): INR, APTT in the last 8760 hours.  BMP: Recent Labs    03/31/17 1529 04/01/17 0404 04/01/17 1031 04/02/17 0908  NA 124* 130* 128* 128*  K 4.4 4.0 4.1 3.5  CL 96* 100* 98* 96*  CO2 24 24 24 23   GLUCOSE 127* 87 107* 166*  BUN 11 9 9 7   CALCIUM 8.3* 8.8* 8.7* 8.9  CREATININE 0.82 0.77 0.76 0.70  GFRNONAA >60 >60 >60 >60  GFRAA >60 >60 >60 >60    LIVER FUNCTION TESTS: Recent Labs    04/02/17 0908  BILITOT 0.8  AST 43*  ALT 18  ALKPHOS 143*  PROT 6.5  ALBUMIN 2.7*    TUMOR MARKERS: No results for input(s): AFPTM, CEA, CA199, CHROMGRNA in the last 8760 hours.  Assessment and Plan:  Pt with new left lung mass Hyponatremia Request for tissue diagnosis---lung bx Dr Kathlene Cote recommends Ct Abd/Pel first ---need for evaluation of all possible biopsy sites I have discussed with pt and she has full understanding of plan. Will re visit her tomorrow after new imaging and plan accordingly. Bx likely 11/14 She is agreeable Dr Alfredia Ferguson also aware and agreeable to plan   Thank you for this interesting consult.  I greatly enjoyed meeting [name] and look forward to participating in their care.  A copy of this report was sent to the requesting provider on this date.  Electronically Signed: Lavonia Drafts, PA-C 04/02/2017, 1:56 PM   I spent a total of 40 Minutes    in face to face in  clinical consultation, greater than 50% of which was counseling/coordinating care for tissue diagnosis/biopsy

## 2017-04-03 ENCOUNTER — Inpatient Hospital Stay (HOSPITAL_COMMUNITY): Payer: Medicare Other

## 2017-04-03 ENCOUNTER — Ambulatory Visit: Payer: Medicare Other | Admitting: Podiatry

## 2017-04-03 DIAGNOSIS — D3502 Benign neoplasm of left adrenal gland: Secondary | ICD-10-CM

## 2017-04-03 DIAGNOSIS — K5900 Constipation, unspecified: Secondary | ICD-10-CM

## 2017-04-03 DIAGNOSIS — R0989 Other specified symptoms and signs involving the circulatory and respiratory systems: Secondary | ICD-10-CM

## 2017-04-03 LAB — COMPREHENSIVE METABOLIC PANEL
ALBUMIN: 2.7 g/dL — AB (ref 3.5–5.0)
ALT: 17 U/L (ref 14–54)
AST: 20 U/L (ref 15–41)
Alkaline Phosphatase: 134 U/L — ABNORMAL HIGH (ref 38–126)
Anion gap: 7 (ref 5–15)
BUN: 12 mg/dL (ref 6–20)
CHLORIDE: 97 mmol/L — AB (ref 101–111)
CO2: 24 mmol/L (ref 22–32)
Calcium: 9.2 mg/dL (ref 8.9–10.3)
Creatinine, Ser: 0.8 mg/dL (ref 0.44–1.00)
GFR calc Af Amer: >60 mL/min (ref >60)
GFR calc non Af Amer: >60 mL/min (ref >60)
Glucose, Bld: 104 mg/dL — ABNORMAL HIGH (ref 65–99)
POTASSIUM: 3.7 mmol/L (ref 3.5–5.1)
Sodium: 128 mmol/L — ABNORMAL LOW (ref 135–145)
TOTAL PROTEIN: 6.2 g/dL — AB (ref 6.5–8.1)
Total Bilirubin: 0.7 mg/dL (ref 0.3–1.2)

## 2017-04-03 LAB — CBC WITH DIFFERENTIAL/PLATELET
Basophils Absolute: 0.1 10*3/uL (ref 0.0–0.1)
Basophils Relative: 1 %
Eosinophils Absolute: 0.2 10*3/uL (ref 0.0–0.7)
Eosinophils Relative: 2 %
HEMATOCRIT: 32.7 % — AB (ref 36.0–46.0)
HEMOGLOBIN: 10.9 g/dL — AB (ref 12.0–15.0)
LYMPHS ABS: 1.8 10*3/uL (ref 0.7–4.0)
Lymphocytes Relative: 18 %
MCH: 28.6 pg (ref 26.0–34.0)
MCHC: 33.3 g/dL (ref 30.0–36.0)
MCV: 85.8 fL (ref 78.0–100.0)
MONO ABS: 1 10*3/uL (ref 0.1–1.0)
MONOS PCT: 10 %
NEUTROS ABS: 6.7 10*3/uL (ref 1.7–7.7)
NEUTROS PCT: 69 %
Platelets: 397 10*3/uL (ref 150–400)
RBC: 3.81 MIL/uL — ABNORMAL LOW (ref 3.87–5.11)
RDW: 14.2 % (ref 11.5–15.5)
WBC: 9.8 10*3/uL (ref 4.0–10.5)

## 2017-04-03 LAB — LIPID PANEL
Cholesterol: 148 mg/dL (ref 0–200)
HDL: 66 mg/dL (ref >40)
LDL CALC: 69 mg/dL (ref 0–99)
TRIGLYCERIDES: 65 mg/dL (ref <150)
Total CHOL/HDL Ratio: 2.2 RATIO
VLDL: 13 mg/dL (ref 0–40)

## 2017-04-03 LAB — PROTIME-INR
INR: 1.04
PROTHROMBIN TIME: 13.5 s (ref 11.4–15.2)

## 2017-04-03 LAB — PHOSPHORUS: PHOSPHORUS: 3.2 mg/dL (ref 2.5–4.6)

## 2017-04-03 LAB — APTT: APTT: 43 s — AB (ref 24–36)

## 2017-04-03 LAB — MAGNESIUM: Magnesium: 1.8 mg/dL (ref 1.7–2.4)

## 2017-04-03 MED ORDER — POLYETHYLENE GLYCOL 3350 17 G PO PACK
17.0000 g | PACK | Freq: Two times a day (BID) | ORAL | Status: DC
  Administered 2017-04-03: 17 g via ORAL
  Filled 2017-04-03: qty 1

## 2017-04-03 MED ORDER — GUAIFENESIN ER 600 MG PO TB12
1200.0000 mg | ORAL_TABLET | Freq: Two times a day (BID) | ORAL | Status: DC
  Administered 2017-04-03 (×2): 1200 mg via ORAL
  Filled 2017-04-03 (×2): qty 2

## 2017-04-03 MED ORDER — SENNOSIDES-DOCUSATE SODIUM 8.6-50 MG PO TABS
1.0000 | ORAL_TABLET | Freq: Two times a day (BID) | ORAL | Status: DC
  Administered 2017-04-03 – 2017-04-04 (×2): 1 via ORAL
  Filled 2017-04-03 (×2): qty 1

## 2017-04-03 MED ORDER — ENOXAPARIN SODIUM 30 MG/0.3ML ~~LOC~~ SOLN: 30.0000 mg | SUBCUTANEOUS | Status: DC

## 2017-04-03 MED ORDER — IOPAMIDOL (ISOVUE-300) INJECTION 61%
INTRAVENOUS | Status: AC
  Administered 2017-04-03: 100 mL
  Filled 2017-04-03: qty 100

## 2017-04-03 NOTE — Progress Notes (Signed)
Patient case reviewed by Dr. Anselm Pancoast who approves patient for lung biopsy.  Anticipate procedure tomorrow (11/14) as schedule allows.  Risks and benefits discussed with the patient including, but not limited to bleeding, hemoptysis, respiratory failure requiring intubation, infection, pneumothorax requiring chest tube placement, stroke from air embolism or even death. All of the patient's questions were answered, patient is agreeable to proceed. Consent signed and in chart.  Will place order for NPO p MN and to hold thinners.   Brynda Greathouse, MS RD PA-C

## 2017-04-03 NOTE — Progress Notes (Signed)
PROGRESS NOTE    [name]  XBD:[id number] DOB: 1929/03/31 DOA: 03/30/2017 PCP: Leonard Downing, MD   Brief Narrative:  The patient 83 year old female with CAD s/p MI, HLD, HTN, and other comorbids who presented with 3 weeks of weakness, fatigue, dizziness.  Found to have hyponatremia with a sodium of 123. CT scan showed 6.4 cm left upper lobe lesion suspicious for bronchogenic carcinoma  Sodium was trended.  On 04/01/2017 max of 130 was reached. On repeat draw sodium of 128. Discussed case with Oncology who recommended Inpatient Biopsy. Pulmonary Consulted who recommended IR CT Guided Biopsy. IR to Evaluate. Case was also discussed with Nephrology who recommended stopping IVF and giving the patient a High Protein Diet and trend Sodium in AM. Patient to undergo Biopsy of Lung Mass 11/14.   Assessment & Plan:   Active Problems:   HLD (hyperlipidemia)   Tobacco abuse   CAD (coronary artery disease)   Hyponatremia   SIADH (syndrome of inappropriate ADH production) (H[facility])   Lung mass   Elevated AST (SGOT)   Leukocytosis   Normocytic anemia   Chest congestion   Constipation   Adenoma of left adrenal gland  Hyponatremia 2/2 SIADH likely from Left Upper Lobe Lung Mass, stable -Stop IVF; Now D/C'd; Fluid Restrict to 1200 mL/day -Give High Protein Diet; Will Consult Nutrition and appreciate Recc's -C/w Ensure Enlive 237 mL po BID -Sodium 127 -> 124 -> 130 -> 128 -> 128 -> 128 -Discussed with Dr. Pearson Grippe informally; If not improving with Fluid Restriction and High Protein Diet will consult formally -Cortisol Level this AM was 11.7 -TSH was 1.741 -Urine Osmolality consistent with SIADH -Patient is -209.5 mL -Discussed with Oncology Dr. Julieanne Manson who recommends Lung Mass Biopsy inpatient as potentially Small Cell Lung Cancer -Repeat CMP in AM   Left Upper Lobe Lung Mass with likely Metastasis -CXR showed cardiac shadow is within normal limits. The lungs are well  aerated bilaterally. A lobulated soft tissue density is noted in the left lung apex new from the prior exam and highly suspicious for underlying neoplasm. Aortic calcifications are seen. No other focal abnormality is noted -CT of Chest showed spiculated irregular solid 6.4 cm posterior apical left upper lobe lung mass, highly suspicious for primary bronchogenic carcinoma. Suggestion of early invasion of the left paraspinal soft tissues at the T3-4 level, without appreciable osseous invasion. Ipsilateral hilar and ipsilateral mediastinal lymphadenopathy, suspicious for nodal metastatic disease. Indeterminate left adrenal nodule, cannot exclude left adrenal metastasis. -Discussed with Oncology Dr. Julieanne Manson who recommended Inpatient Biopsy and will follow patient in Oncology Clinic later this week after Biopsy by PCCM or IR CT Biopsy  -Pulmonary Dr. Titus Mould consulted for further evaluation and management and for potential Lung Mass Biopsy  -IR Consulted by Pulmonary for Lung Biopsy.  -IR ordered CT Abd/Pelvis to evaluate prior to biopsy; CT Abd Pelvis showed No definite findings of metastatic disease in the abdomen or Pelvis. -Patient to likely undergo Lung Biopsy 11/14; NPO at MN and Hold Enoxaparin Dose today.   Suspected Left Adrenal Adenoma -CT Scan showed a 10 x 18 mm left adrenal nodule with relative washout of 47%. This is borderline for adenoma. Attention on follow-up imaging recommended. If further characterization is required at this time, abdominal MRI with in and out of phase T1 imaging may be able to more definitively characterize this nodule. Can be done as an outpatient  CAD with Hx of MI -C/w ASA 81 mg po Daily, Lisinopril 20  mg po Daily and NTG 0.4 mg SL q33min prn CP  HLD -Lipid Panel this AM showed Cholesterol of 148, HDL of 66, LDL of 69, TG of 65, VLDL of 13 -Not on Statin from [facility]  Tobacco Abuse  -Smoking Cessastion Counseling given -Restarted Albuterol 2.5 mg  q6hprn   Chest Congestion -Start Muicinex -C/w Albuterol 2.5 mg q6hprn  Elevated AST -Mild -AST was 43 and improved to 20 -Continue to Monitor and Repeat CMP in AM   Leukocytosis, improved -Mild at 10.8 -> 9.8 -Continue to Monitor for S/Sx of Infection -Repeat CBC in AM   Normocytic Anemia -Patient's Hb/Hct stable at 11.4/34.4 -> 10.9/32.7 -Continue to Monitor for S/Sx of Bleeding -Repeat CBC in AM   Constipation -Started patient on Miralax 17 grams po BID -Added Senna-Docusate 1 tab po BID  DVT prophylaxis: Enoxaparin 30 mg sq q24h Code Status: FULL CODE Family Communication: No family present  Disposition Plan: Anticipate D/C within 24-48 hours pending improvement in Sodium and Lung Mass Biopsy  Consultants:   Pulmonary Critical Care Medicine Dr. Titus Mould   Discussed Case with Oncology Dr. Julieanne Manson  Discussed Case with Nephrology Dr. Pearson Grippe  Interventional Radiology Dr. Kathlene Cote / Dr. Anselm Pancoast   Procedures: None   Antimicrobials: Anti-infectives (From admission, onward)   None     Subjective: Seen and examined at bedside and understood the plan. Stated she had some chest congestion and could not cough it up. No N/V. No CP or SOB. No other concerns or complaints at this time except that she has not had a bowel movement in a few days.   Objective: Vitals:   04/02/17 2030 04/02/17 2157 04/03/17 0624 04/03/17 0704  BP: (!) 170/66 (!) 154/64 (!) 171/55 (!) 139/56  Pulse: 71 70 75 80  Resp: 16  16   Temp: 98.1 F (36.7 C)  98.3 F (36.8 C)   TempSrc: Oral  Oral   SpO2: 97%  97%   Weight:      Height:        Intake/Output Summary (Last 24 hours) at 04/03/2017 1626 Last data filed at 04/03/2017 0820 Gross per 24 hour  Intake 536 ml  Output -  Net 536 ml   Filed Weights   03/30/17 2001  Weight: 55.7 kg (122 lb 12.7 oz)   Examination: Physical Exam:  Constitutional: Thin Caucasian female in NAD appears calm and comfortable.  Eyes: Lids  and conjunctivae normal. Sclerae anicteric ENMT: External Ears and nose appear normal. MMM. Poor dentition Neck: Supple with no JVD Respiratory: Diminished in the LUL. No appreciable wheezing/rales/rhonchi. Patient was not tachypenic or using any accessory muscles to breathe Cardiovascular: RRR; No appreciable extremity edema Abdomen: Soft, NT, ND. Bowel sounds present GU: Deferred Musculoskeletal: No contractures. No cyanosis Skin: Warm and dry. No rashes or lesions on a limited  Neurologic: CN 2-12 grossly intact. No appreciable focal deficits Psychiatric: Normal mood and affect. Intact judgement and insight  Data Reviewed: I have personally reviewed following labs and imaging studies  CBC: Recent Labs  Lab 03/30/17 1402 04/02/17 0908 04/03/17 0425  WBC 10.3 10.8* 9.8  NEUTROABS  --  8.6* 6.7  HGB 11.4* 11.4* 10.9*  HCT 34.3* 34.4* 32.7*  MCV 84.9 85.8 85.8  PLT 389 378 045   Basic Metabolic Panel: Recent Labs  Lab 03/31/17 1529 04/01/17 0404 04/01/17 1031 04/02/17 0908 04/03/17 0425  NA 124* 130* 128* 128* 128*  K 4.4 4.0 4.1 3.5 3.7  CL 96* 100* 98* 96*  97*  CO2 24 24 24 23 24   GLUCOSE 127* 87 107* 166* 104*  BUN 11 9 9 7 12   CREATININE 0.82 0.77 0.76 0.70 0.80  CALCIUM 8.3* 8.8* 8.7* 8.9 9.2  MG  --   --   --  1.7 1.8  PHOS  --   --   --  2.5 3.2   GFR: Estimated Creatinine Clearance: 38.1 mL/min (by C-G formula based on SCr of 0.8 mg/dL). Liver Function Tests: Recent Labs  Lab 04/02/17 0908 04/03/17 0425  AST 43* 20  ALT 18 17  ALKPHOS 143* 134*  BILITOT 0.8 0.7  PROT 6.5 6.2*  ALBUMIN 2.7* 2.7*   No results for input(s): LIPASE, AMYLASE in the last 168 hours. No results for input(s): AMMONIA in the last 168 hours. Coagulation Profile: Recent Labs  Lab 04/03/17 0425  INR 1.04   Cardiac Enzymes: No results for input(s): CKTOTAL, CKMB, CKMBINDEX, TROPONINI in the last 168 hours. BNP (last 3 results) No results for input(s): PROBNP in the last  8760 hours. HbA1C: No results for input(s): HGBA1C in the last 72 hours. CBG: No results for input(s): GLUCAP in the last 168 hours. Lipid Profile: Recent Labs    04/03/17 0425  CHOL 148  HDL 66  LDLCALC 69  TRIG 65  CHOLHDL 2.2   Thyroid Function Tests: Recent Labs    04/01/17 1515  TSH 1.741   Anemia Panel: No results for input(s): VITAMINB12, FOLATE, FERRITIN, TIBC, IRON, RETICCTPCT in the last 72 hours. Sepsis Labs: No results for input(s): PROCALCITON, LATICACIDVEN in the last 168 hours.  No results found for this or any previous visit (from the past 240 hour(s)).   Radiology Studies: Ct Abdomen Pelvis W Contrast  Result Date: 04/03/2017 CLINICAL DATA:  Lung mass.  Evaluate for metastatic disease. EXAM: CT ABDOMEN AND PELVIS WITH CONTRAST TECHNIQUE: Multidetector CT imaging of the abdomen and pelvis was performed using the standard protocol following bolus administration of intravenous contrast. CONTRAST:  177mL ISOVUE-300 IOPAMIDOL (ISOVUE-300) INJECTION 61% COMPARISON:  CT chest 03/30/2017 FINDINGS: Lower chest:  Emphysema noted in the lung bases. Hepatobiliary: Focus of hypo attenuation in the medial segment left liver adjacent to the falciform ligament is in a characteristic location for focal fatty deposition. Liver parenchyma otherwise unremarkable. Gallbladder surgically absent. No intrahepatic or extrahepatic biliary dilation. Pancreas: No focal mass lesion. No dilatation of the main duct. No intraparenchymal cyst. No peripancreatic edema. Spleen: No splenomegaly. No focal mass lesion. Adrenals/Urinary Tract: 10 x 18 mm left adrenal nodule evident with relative washout value of 47%. There is thickening of the right adrenal gland. Kidneys are unremarkable. No evidence for hydroureter. Bladder largely obscured by streak artifact from the left hip replacement. Stomach/Bowel: Stomach is nondistended. No gastric wall thickening. No evidence of outlet obstruction. Duodenum is  normally positioned as is the ligament of Treitz. No small bowel wall thickening. No small bowel dilatation. The terminal ileum is normal. The appendix is not visualized, but there is no edema or inflammation in the region of the cecum. Diverticular changes are noted in the sigmoid colon without features of diverticulitis. Vascular/Lymphatic: There is abdominal aortic atherosclerosis without aneurysm. There is no gastrohepatic or hepatoduodenal ligament lymphadenopathy. No intraperitoneal or retroperitoneal lymphadenopathy. No pelvic sidewall lymphadenopathy. Reproductive: Obscured by streak artifact from right hip replacement. Other: No intraperitoneal free fluid. Musculoskeletal: Status post right hip replacement. Bone windows reveal no worrisome lytic or sclerotic osseous lesions. IMPRESSION: 1. No definite findings of metastatic disease in the abdomen  or pelvis. 2. 10 x 18 mm left adrenal nodule with relative washout of 47%. This is borderline for adenoma. Attention on follow-up imaging recommended. If further characterization is required at this time, abdominal MRI with in and out of phase T1 imaging may be able to more definitively characterize this nodule. 3. Small area of hypo attenuation in the left liver, fairly characteristic for focal fatty deposition along the falciform ligament. Attention on follow-up imaging recommended. 4.  Aortic Atherosclerois (ICD10-170.0) Electronically Signed   By: Misty Stanley M.D.   On: 04/03/2017 09:16    Scheduled Meds: . aspirin EC  81 mg Oral Daily  . [START ON 04/05/2017] enoxaparin (LOVENOX) injection  30 mg Subcutaneous Q24H  . feeding supplement (ENSURE ENLIVE)  237 mL Oral BID BM  . guaiFENesin  1,200 mg Oral BID  . lisinopril  20 mg Oral Daily  . polyethylene glycol  17 g Oral BID  . senna-docusate  1 tablet Oral BID   Continuous Infusions:   LOS: 2 days   Kerney Elbe, DO [facility] Pager [phone]  If 7PM-7AM, please contact  night-coverage [email or web address] Password [facility] 04/03/2017, 4:26 PM

## 2017-04-03 NOTE — Progress Notes (Signed)
Nutrition Follow-up  DOCUMENTATION CODES:   Not applicable  INTERVENTION:   -Continue Ensure Enlive po BID, each supplement provides 350 kcal and 20 grams of protein  NUTRITION DIAGNOSIS:   Increased nutrient needs related to catabolic illness(upper lt lob mass concerning for bronchogenic cancer) as evidenced by estimated needs.  Ongoing  GOAL:   Patient will meet greater than or equal to 90% of their needs  Progressing  MONITOR:   PO intake, Supplement acceptance, Labs, Weight trends, Skin, I & O's  REASON FOR ASSESSMENT:   Malnutrition Screening Tool    ASSESSMENT:   [name]  is a 83 y.o. female, with past medical history significant for coronary artery disease status post MI in the past, abdominal aortic aneurysm, carotid artery occlusion and common bile duct dilatation presenting today with 3 weeks history of weakness, fatigue and dizziness. She is a current smoker. Patient denies any chest pains , shortness of breath, nausea  11/13- s/p CT of abdomen and pelvis  Plan for lung biopsy tomorrow AM.  Spoke with pt at bedside. She reports decline in appetite over the past 2-3 weeks. Typical meal intake is 3 meals per day- Breakfast: cold cereal, Lunch: sandwich, dinner: meat, starch, and vegetable. Pt reports she has mainly been consuming soup and ice cream over the past 2 weeks. Pt reports that intake has been fair to poor- consumed some soup last night. Noted meal completion 70-100%.   Pt reports UBW is around 125#. She estimates she has lost 9# since January, but she shares that this was intentional wt loss.   Pt is consuming Ensure supplements (was also consuming PTA).   Pt very anxious regarding upcoming procedures and prognosis. RD spent time actively listening to pt concerns and providing reassurance. Discussed importance of good meal and supplement intake to promote healing.   Labs reviewed: Na: 128.   NUTRITION - FOCUSED PHYSICAL EXAM:    Most  Recent Value  Orbital Region  Mild depletion  Upper Arm Region  Mild depletion  Thoracic and Lumbar Region  No depletion  Buccal Region  No depletion  Temple Region  No depletion  Clavicle Bone Region  No depletion  Clavicle and Acromion Bone Region  No depletion  Scapular Bone Region  No depletion  Dorsal Hand  No depletion  Patellar Region  Mild depletion  Anterior Thigh Region  Mild depletion  Posterior Calf Region  Mild depletion  Edema (RD Assessment)  Mild  Hair  Reviewed  Eyes  Reviewed  Mouth  Reviewed  Skin  Reviewed  Nails  Reviewed       Diet Order:  Diet Heart Room service appropriate? Yes; Fluid consistency: Thin; Fluid restriction: 1200 mL Fluid  EDUCATION NEEDS:   Not appropriate for education at this time  Skin:  Skin Assessment: Reviewed RN Assessment  Last BM:  03/30/17  Height:   Ht Readings from Last 1 Encounters:  03/30/17 5\' 1"  (1.549 m)    Weight:   Wt Readings from Last 1 Encounters:  03/30/17 122 lb 12.7 oz (55.7 kg)    Ideal Body Weight:  47.7 kg  BMI:  Body mass index is 23.2 kg/m.  Estimated Nutritional Needs:   Kcal:  1450-1650  Protein:  65-80 grams  Fluid:  1.4-1.6 L    Burk Hoctor A. Jimmye Norman, RD, LDN, CDE Pager: [phone] After hours Pager: [phone]

## 2017-04-04 ENCOUNTER — Inpatient Hospital Stay (HOSPITAL_COMMUNITY): Payer: Medicare Other

## 2017-04-04 LAB — COMPREHENSIVE METABOLIC PANEL
ALT: 19 U/L (ref 14–54)
AST: 23 U/L (ref 15–41)
Albumin: 2.6 g/dL — ABNORMAL LOW (ref 3.5–5.0)
Alkaline Phosphatase: 124 U/L (ref 38–126)
Anion gap: 6 (ref 5–15)
BILIRUBIN TOTAL: 0.4 mg/dL (ref 0.3–1.2)
BUN: 16 mg/dL (ref 6–20)
CHLORIDE: 99 mmol/L — AB (ref 101–111)
CO2: 26 mmol/L (ref 22–32)
Calcium: 9.5 mg/dL (ref 8.9–10.3)
Creatinine, Ser: 0.75 mg/dL (ref 0.44–1.00)
Glucose, Bld: 105 mg/dL — ABNORMAL HIGH (ref 65–99)
POTASSIUM: 4 mmol/L (ref 3.5–5.1)
Sodium: 131 mmol/L — ABNORMAL LOW (ref 135–145)
TOTAL PROTEIN: 6.2 g/dL — AB (ref 6.5–8.1)

## 2017-04-04 LAB — CBC WITH DIFFERENTIAL/PLATELET
Basophils Absolute: 0.1 10*3/uL (ref 0.0–0.1)
Basophils Relative: 1 %
EOS PCT: 3 %
Eosinophils Absolute: 0.3 10*3/uL (ref 0.0–0.7)
HEMATOCRIT: 33.2 % — AB (ref 36.0–46.0)
Hemoglobin: 10.9 g/dL — ABNORMAL LOW (ref 12.0–15.0)
LYMPHS ABS: 2 10*3/uL (ref 0.7–4.0)
LYMPHS PCT: 20 %
MCH: 28.2 pg (ref 26.0–34.0)
MCHC: 32.8 g/dL (ref 30.0–36.0)
MCV: 85.8 fL (ref 78.0–100.0)
MONO ABS: 1.2 10*3/uL — AB (ref 0.1–1.0)
MONOS PCT: 12 %
NEUTROS ABS: 6.5 10*3/uL (ref 1.7–7.7)
Neutrophils Relative %: 64 %
PLATELETS: 371 10*3/uL (ref 150–400)
RBC: 3.87 MIL/uL (ref 3.87–5.11)
RDW: 14.2 % (ref 11.5–15.5)
WBC: 10 10*3/uL (ref 4.0–10.5)

## 2017-04-04 LAB — MAGNESIUM: MAGNESIUM: 2 mg/dL (ref 1.7–2.4)

## 2017-04-04 LAB — PHOSPHORUS: PHOSPHORUS: 3.5 mg/dL (ref 2.5–4.6)

## 2017-04-04 MED ORDER — GUAIFENESIN ER 600 MG PO TB12: 1200.0000 mg | ORAL_TABLET | Freq: Two times a day (BID) | ORAL | 0 refills | Status: DC

## 2017-04-04 MED ORDER — POLYETHYLENE GLYCOL 3350 17 G PO PACK: 17.0000 g | PACK | Freq: Every day | ORAL | 0 refills | Status: DC | PRN

## 2017-04-04 MED ORDER — LISINOPRIL 20 MG PO TABS: 20.0000 mg | ORAL_TABLET | Freq: Every day | ORAL | 0 refills | Status: DC

## 2017-04-04 MED ORDER — FENTANYL CITRATE (PF) 100 MCG/2ML IJ SOLN
INTRAMUSCULAR | Status: AC
  Filled 2017-04-04: qty 2

## 2017-04-04 MED ORDER — ENSURE ENLIVE PO LIQD: 237.0000 mL | Freq: Two times a day (BID) | ORAL | 0 refills | Status: DC

## 2017-04-04 MED ORDER — FENTANYL CITRATE (PF) 100 MCG/2ML IJ SOLN
INTRAMUSCULAR | Status: AC | PRN
  Administered 2017-04-04: 50 ug via INTRAVENOUS

## 2017-04-04 MED ORDER — MIDAZOLAM HCL 2 MG/2ML IJ SOLN
INTRAMUSCULAR | Status: AC
  Filled 2017-04-04: qty 2

## 2017-04-04 MED ORDER — MIDAZOLAM HCL 2 MG/2ML IJ SOLN
INTRAMUSCULAR | Status: AC | PRN
  Administered 2017-04-04: 1 mg via INTRAVENOUS

## 2017-04-04 NOTE — Discharge Summary (Signed)
Physician Discharge Summary  [name] OIN:[id number] DOB: 10-Jan-1929 DOA: 03/30/2017  PCP: Leonard Downing, MD  Admit date: 03/30/2017 Discharge date: 04/04/2017  Admitted From: Home Disposition: Home  Recommendations for Outpatient Follow-up:  1. Follow up with PCP in 1 week 2. Oncology follow-up 3. Repeat BMP in one week 4. Please follow up on the following pending results: Lung biopsy  Home Health: Physical therapy Equipment/Devices: None  Discharge Condition: Stable CODE STATUS: DNR Diet recommendation: Fluid restriction  Brief/Interim Summary:  Admission HPI written by Merton Border, MD   HPI  [name]  is a 83 y.o. female, with past medical history significant for coronary artery disease status post MI in the past, abdominal aortic aneurysm, carotid artery occlusion and common bile duct dilatation presenting today with 3 weeks history of weakness, fatigue and dizziness. She is a current smoker. Patient denies any chest pains , shortness of breath, nausea, vomiting or diarrhea. She reports decreased appetite with poor by mouth intake and she has lost 9 pounds in the last 1 year. CT of the chest in the emergency room showed a 6.4 cm left upper lobe lesion suspicious for primary bronchogenic carcinoma, metastatic. She was also noted to be hyponatremic . Patient is admitted for management of her hyponatremia and workup for her left upper lobe mass.    Hospital course:  Hyponatremia Secondary to SIADH which is likely secondary to left upper lobe lung mass which likely represents primary lung cancer. Patient initially tread with IV fluids but was then switched to fluid restriction. Sodium improved to 131 prior to discharge.  Left upper lobe lung mass Likely primary lung cancer with metastasis. Seen on CT chest. Biopsy performed by interventional radiology. Results pending at time of discharge. Oncology consulted for outpatient follow-up of biopsy  results.  Left adrenal mass Possibly adenoma on CT scan. Recommend follow-up MRI as an outpatient.  CAD History of MI Continued aspirin.  Hyperlipidemia LDL of 69  Tobacco abuse Counseling given. Albuterol as needed.  Chest congestion Improved with Mucinex and albuterol prn.  Elevated AST Mildly elevated and spontaneously resolved.  Constipation Bowel regimen.  Discharge Diagnoses:  Active Problems:   HLD (hyperlipidemia)   Tobacco abuse   CAD (coronary artery disease)   Hyponatremia   SIADH (syndrome of inappropriate ADH production) (HCC)   Lung mass   Elevated AST (SGOT)   Leukocytosis   Normocytic anemia   Chest congestion   Constipation   Adenoma of left adrenal gland    Discharge Instructions  Discharge Instructions    Call MD for:  difficulty breathing, headache or visual disturbances   Complete by:  As directed    Call MD for:  extreme fatigue   Complete by:  As directed    Call MD for:  persistant dizziness or light-headedness   Complete by:  As directed    Call MD for:  persistant nausea and vomiting   Complete by:  As directed    Call MD for:  severe uncontrolled pain   Complete by:  As directed    Call MD for:  temperature >100.4   Complete by:  As directed    Diet - low sodium heart healthy   Complete by:  As directed    Increase activity slowly   Complete by:  As directed      Allergies as of 04/04/2017      Reactions   Dextroamphetamine Sulfate Er Other (See Comments)   Euphoric feeling   Other  Other reaction(s): Other (See Comments) Euphoric feeling      Medication List    TAKE these medications   aspirin 81 MG EC tablet Take 81 mg by mouth daily.   CALCIUM + D PO Take 1 tablet by mouth daily.   feeding supplement (ENSURE ENLIVE) Liqd Take 237 mLs 2 (two) times daily between meals by mouth. Start taking on:  04/05/2017   GLUCOSAMINE CHONDR COMPLEX PO Take 1 capsule by mouth daily.   guaiFENesin 600 MG 12 hr  tablet Commonly known as:  MUCINEX Take 2 tablets (1,200 mg total) 2 (two) times daily by mouth.   lisinopril 20 MG tablet Commonly known as:  PRINIVIL,ZESTRIL Take 1 tablet (20 mg total) daily by mouth. Start taking on:  04/05/2017 What changed:    how much to take  how to take this  when to take this   multivitamin with minerals Tabs tablet Take 1 tablet by mouth at bedtime.   nitroGLYCERIN 0.4 MG SL tablet Commonly known as:  NITROSTAT Place 0.4 mg under the tongue every 5 (five) minutes as needed for chest pain.   polyethylene glycol packet Commonly known as:  MIRALAX / GLYCOLAX Take 17 g daily as needed by mouth.      Follow-up Information    [facility]-Home Follow up.   Specialty:  [place] Why:  HHRN/PT arranged- they will call you to set up home visits Contact information: [place] [address] [phone]          Allergies  Allergen Reactions  . Dextroamphetamine Sulfate Er Other (See Comments)    Euphoric feeling  . Other     Other reaction(s): Other (See Comments) Euphoric feeling    Consultations:  Pulmonology  Interventional radiology  Oncology   Procedures/Studies: Dg Chest 2 View  Result Date: 03/30/2017 CLINICAL DATA:  Decreased appetite and weakness EXAM: CHEST  2 VIEW COMPARISON:  01/09/2014 FINDINGS: Cardiac shadow is within normal limits. The lungs are well aerated bilaterally. A lobulated soft tissue density is noted in the left lung apex new from the prior exam and highly suspicious for underlying neoplasm. Aortic calcifications are seen. No other focal abnormality is noted. IMPRESSION: Soft tissue masslike density in the left apex suspicious for underlying neoplasm. CT of the chest with contrast is recommended for further evaluation. These results were called by telephone at the time of interpretation on 03/30/2017 at 3:58 pm to Dr. Carmin Muskrat , who verbally acknowledged these  results. Electronically Signed   By: Inez Catalina M.D.   On: 03/30/2017 15:58   Ct Chest W Contrast  Result Date: 03/30/2017 CLINICAL DATA:  New masslike opacity at the left lung apex on chest radiograph EXAM: CT CHEST WITH CONTRAST TECHNIQUE: Multidetector CT imaging of the chest was performed during intravenous contrast administration. CONTRAST:  50mL ISOVUE-300 IOPAMIDOL (ISOVUE-300) INJECTION 61% COMPARISON:  Chest radiograph from earlier today. FINDINGS: Cardiovascular: Normal heart size. No significant pericardial fluid/thickening. Left main, left anterior descending, left circumflex and right coronary atherosclerosis. Atherosclerotic nonaneurysmal thoracic aorta, noting extensive ulcerated plaque throughout the aorta. Normal caliber pulmonary arteries. No central pulmonary emboli. Mediastinum/Nodes: Subcentimeter hypodense left thyroid lobe nodule. Unremarkable esophagus. No axillary adenopathy. Mildly enlarged 1.0 cm left paratracheal node (series 2/ image 40). Mildly enlarged 1.2 cm left suprahilar node (series 2/ image 39). No additional pathologically enlarged mediastinal or hilar nodes. Lungs/Pleura: No pneumothorax. No pleural effusion. Mild centrilobular and paraseptal emphysema. Spiculated irregular solid 6.4 x 5.3  cm posterior apical left upper lobe lung mass (series 3/image 15), with suggestion of early invasion of the left paraspinal soft tissues at the T3-4 level (series 2/ image 23 and series 4/ image 58). No definite osseous invasion. Punctate calcified granulomas in the right upper lung. Otherwise no significant pulmonary nodules. Upper abdomen: Indeterminate 1.5 cm left adrenal nodule with density 65 HU (series 2/ image 126). Musculoskeletal: No aggressive appearing focal osseous lesions. Mild thoracic spondylosis. IMPRESSION: 1. Spiculated irregular solid 6.4 cm posterior apical left upper lobe lung mass, highly suspicious for primary bronchogenic carcinoma. Suggestion of early invasion  of the left paraspinal soft tissues at the T3-4 level, without appreciable osseous invasion. 2. Ipsilateral hilar and ipsilateral mediastinal lymphadenopathy, suspicious for nodal metastatic disease. 3. Indeterminate left adrenal nodule, cannot exclude left adrenal metastasis. 4. Multidisciplinary thoracic oncology consultation and PET-CT recommended . 5. Left main and 3 vessel coronary atherosclerosis. Aortic Atherosclerosis (ICD10-I70.0) and Emphysema (ICD10-J43.9). Electronically Signed   By: Ilona Sorrel M.D.   On: 03/30/2017 17:05   Ct Abdomen Pelvis W Contrast  Result Date: 04/03/2017 CLINICAL DATA:  Lung mass.  Evaluate for metastatic disease. EXAM: CT ABDOMEN AND PELVIS WITH CONTRAST TECHNIQUE: Multidetector CT imaging of the abdomen and pelvis was performed using the standard protocol following bolus administration of intravenous contrast. CONTRAST:  186mL ISOVUE-300 IOPAMIDOL (ISOVUE-300) INJECTION 61% COMPARISON:  CT chest 03/30/2017 FINDINGS: Lower chest:  Emphysema noted in the lung bases. Hepatobiliary: Focus of hypo attenuation in the medial segment left liver adjacent to the falciform ligament is in a characteristic location for focal fatty deposition. Liver parenchyma otherwise unremarkable. Gallbladder surgically absent. No intrahepatic or extrahepatic biliary dilation. Pancreas: No focal mass lesion. No dilatation of the main duct. No intraparenchymal cyst. No peripancreatic edema. Spleen: No splenomegaly. No focal mass lesion. Adrenals/Urinary Tract: 10 x 18 mm left adrenal nodule evident with relative washout value of 47%. There is thickening of the right adrenal gland. Kidneys are unremarkable. No evidence for hydroureter. Bladder largely obscured by streak artifact from the left hip replacement. Stomach/Bowel: Stomach is nondistended. No gastric wall thickening. No evidence of outlet obstruction. Duodenum is normally positioned as is the ligament of Treitz. No small bowel wall thickening.  No small bowel dilatation. The terminal ileum is normal. The appendix is not visualized, but there is no edema or inflammation in the region of the cecum. Diverticular changes are noted in the sigmoid colon without features of diverticulitis. Vascular/Lymphatic: There is abdominal aortic atherosclerosis without aneurysm. There is no gastrohepatic or hepatoduodenal ligament lymphadenopathy. No intraperitoneal or retroperitoneal lymphadenopathy. No pelvic sidewall lymphadenopathy. Reproductive: Obscured by streak artifact from right hip replacement. Other: No intraperitoneal free fluid. Musculoskeletal: Status post right hip replacement. Bone windows reveal no worrisome lytic or sclerotic osseous lesions. IMPRESSION: 1. No definite findings of metastatic disease in the abdomen or pelvis. 2. 10 x 18 mm left adrenal nodule with relative washout of 47%. This is borderline for adenoma. Attention on follow-up imaging recommended. If further characterization is required at this time, abdominal MRI with in and out of phase T1 imaging may be able to more definitively characterize this nodule. 3. Small area of hypo attenuation in the left liver, fairly characteristic for focal fatty deposition along the falciform ligament. Attention on follow-up imaging recommended. 4.  Aortic Atherosclerois (ICD10-170.0) Electronically Signed   By: Misty Stanley M.D.   On: 04/03/2017 09:16   Ct Biopsy  Result Date: 04/04/2017 INDICATION: Left upper lobe mass EXAM: CT-GUIDED BIOPSY LARGE LEFT  UPPER LOBE MASS MEDICATIONS: 1% LIDOCAINE LOCAL ANESTHESIA/SEDATION: 1.0 mg IV Versed; 25 mcg IV Fentanyl Moderate Sedation Time:  15 MINUTES The patient was continuously monitored during the procedure by the interventional radiology nurse under my direct supervision. PROCEDURE: The procedure, risks, benefits, and alternatives were explained to the patient. Questions regarding the procedure were encouraged and answered. The patient understands and  consents to the procedure. previous imaging reviewed. patient position left side down decubitus. noncontrast localization ct performed. the large left upper lobe posterior apical mass was localized. Overlying skin marked. Under sterile conditions and local anesthesia, a 17 gauge coaxial guide needle was advanced from a posterior intercostal approach into the left upper lobe mass medially. Needle position confirmed with CT. 3 18 gauge core biopsies obtained. Samples placed in formalin. Needle tract occlusion performed with the biosentry device. Postprocedure imaging demonstrates no hemorrhage or hematoma. No pneumothorax. Patient tolerated the procedure well without complication. Vital sign monitoring by nursing staff during the procedure will continue as patient is in the special procedures unit for post procedure observation. FINDINGS: The images document guide needle placement within the large left upper lobe mass. Post biopsy images demonstrate no evidence of pneumothorax or effusion. COMPLICATIONS: None immediate. IMPRESSION: Successful CT-guided core biopsy of the left upper lobe mass Electronically Signed   By: Jerilynn Mages.  Shick M.D.   On: 04/04/2017 15:45      Subjective: No chest pain or dyspnea.  Discharge Exam: Vitals:   04/04/17 1600 04/04/17 1634  BP: (!) 147/70 (!) 146/57  Pulse: 89 89  Resp:    Temp: 98.1 F (36.7 C) 98.7 F (37.1 C)  SpO2: 98% 96%   Vitals:   04/04/17 1531 04/04/17 1541 04/04/17 1600 04/04/17 1634  BP: (!) 186/52 (!) 181/62 (!) 147/70 (!) 146/57  Pulse: 99 85 89 89  Resp: 18 19    Temp:   98.1 F (36.7 C) 98.7 F (37.1 C)  TempSrc:   Oral Oral  SpO2: 100% 100% 98% 96%  Weight:      Height:        General: Pt is alert, awake, not in acute distress Cardiovascular: RRR, S1/S2 +, no rubs, no gallops Respiratory: CTA bilaterally, diminished, no wheezing, no rhonchi Abdominal: Soft, NT, ND, bowel sounds + Extremities: no edema, no cyanosis    The results of  significant diagnostics from this hospitalization (including imaging, microbiology, ancillary and laboratory) are listed below for reference.     Microbiology: No results found for this or any previous visit (from the past 240 hour(s)).   Labs: BNP (last 3 results) No results for input(s): BNP in the last 8760 hours. Basic Metabolic Panel: Recent Labs  Lab 04/01/17 0404 04/01/17 1031 04/02/17 0908 04/03/17 0425 04/04/17 0539  NA 130* 128* 128* 128* 131*  K 4.0 4.1 3.5 3.7 4.0  CL 100* 98* 96* 97* 99*  CO2 24 24 23 24 26   GLUCOSE 87 107* 166* 104* 105*  BUN 9 9 7 12 16   CREATININE 0.77 0.76 0.70 0.80 0.75  CALCIUM 8.8* 8.7* 8.9 9.2 9.5  MG  --   --  1.7 1.8 2.0  PHOS  --   --  2.5 3.2 3.5   Liver Function Tests: Recent Labs  Lab 04/02/17 0908 04/03/17 0425 04/04/17 0539  AST 43* 20 23  ALT 18 17 19   ALKPHOS 143* 134* 124  BILITOT 0.8 0.7 0.4  PROT 6.5 6.2* 6.2*  ALBUMIN 2.7* 2.7* 2.6*   No results for input(s): LIPASE,  AMYLASE in the last 168 hours. No results for input(s): AMMONIA in the last 168 hours. CBC: Recent Labs  Lab 03/30/17 1402 04/02/17 0908 04/03/17 0425 04/04/17 0539  WBC 10.3 10.8* 9.8 10.0  NEUTROABS  --  8.6* 6.7 6.5  HGB 11.4* 11.4* 10.9* 10.9*  HCT 34.3* 34.4* 32.7* 33.2*  MCV 84.9 85.8 85.8 85.8  PLT 389 378 397 371   Cardiac Enzymes: No results for input(s): CKTOTAL, CKMB, CKMBINDEX, TROPONINI in the last 168 hours. BNP: Invalid input(s): POCBNP CBG: No results for input(s): GLUCAP in the last 168 hours. D-Dimer No results for input(s): DDIMER in the last 72 hours. Hgb A1c No results for input(s): HGBA1C in the last 72 hours. Lipid Profile Recent Labs    04/03/17 0425  CHOL 148  HDL 66  LDLCALC 69  TRIG 65  CHOLHDL 2.2   Thyroid function studies No results for input(s): TSH, T4TOTAL, T3FREE, THYROIDAB in the last 72 hours.  Invalid input(s): FREET3 Anemia work up No results for input(s): VITAMINB12, FOLATE,  FERRITIN, TIBC, IRON, RETICCTPCT in the last 72 hours. Urinalysis    Component Value Date/Time   COLORURINE YELLOW 03/30/2017 1750   APPEARANCEUR HAZY (A) 03/30/2017 1750   LABSPEC 1.020 03/30/2017 1750   PHURINE 7.0 03/30/2017 1750   GLUCOSEU NEGATIVE 03/30/2017 1750   HGBUR SMALL (A) 03/30/2017 1750   BILIRUBINUR NEGATIVE 03/30/2017 1750   KETONESUR NEGATIVE 03/30/2017 1750   PROTEINUR 30 (A) 03/30/2017 1750   UROBILINOGEN 0.2 01/09/2014 1448   NITRITE POSITIVE (A) 03/30/2017 1750   LEUKOCYTESUR TRACE (A) 03/30/2017 1750    SIGNED:   Cordelia Poche, MD [facility] 04/04/2017, 5:51 PM Pager [phone]  If 7PM-7AM, please contact night-coverage [email or web address] Password [email or web address]

## 2017-04-04 NOTE — Progress Notes (Signed)
1600 Patient is back from CT, post lung biopsy. Bandaid to upper back is dry and intact. Patient denies pain.

## 2017-04-04 NOTE — Progress Notes (Addendum)
1843 Biopsy site clean, dry, and intact. Patient denies pain.  Patient discharged to home in stable condition. Verbalizes understanding of all discharge instructions including discharge medications, and follow up appointment with home health.

## 2017-04-04 NOTE — Sedation Documentation (Signed)
Patient denies pain and is resting comfortably.  

## 2017-04-04 NOTE — Evaluation (Signed)
Physical Therapy Evaluation Patient Details Name: [name] MRN: [id number] DOB: [birth year] Today's Date: 04/04/2017   History of Present Illness  The patient 83 year old female with CAD s/p MI, HLD, HTN, and other comorbids who presented with 3 weeks of weakness, fatigue, dizziness. Found to have hyponatremia with a sodium of 123. CT scan showed 6.4 cm left upper lobe lesion suspicious for bronchogenic carcinoma. Discussed case with Oncology who recommended Inpatient Biopsy. Pulmonary Consulted who recommended IR CT Guided Biopsy.Patient to undergo Biopsy of Lung Mass 11/14.  Clinical Impression  Pt admitted with above diagnosis. Pt currently with functional limitations due to the deficits listed below (see PT Problem List). At the time of PT eval pt was able to perform transfers and ambulation with gross modified independence to min guard assist for balance support and safety. Pt reports feeling a gradual decline in strength/energy, however was still managing independently at home PTA. She does have a daughter that is close by and able to assist as needed. Discussed follow-up recommendations with pt and she appeared excited about the idea of HHPT coming in to help get her on a "good exercise routine". Pt will benefit from skilled PT to increase their independence and safety with mobility to allow discharge to the venue listed below.       Follow Up Recommendations Home health PT;Supervision - Intermittent    Equipment Recommendations  None recommended by PT    Recommendations for Other Services       Precautions / Restrictions Precautions Precautions: Fall Restrictions Weight Bearing Restrictions: No      Mobility  Bed Mobility Overal bed mobility: Modified Independent             General bed mobility comments: No assist required. HOB flat and rails lowered to simulate home environment  Transfers Overall transfer level: Modified independent Equipment used: None              General transfer comment: Pt was able to power-up to full stand without assistance, however immediately held to railing on her L for support. Did not appear unsteady, however pt states she feels more confident holding to something.   Ambulation/Gait Ambulation/Gait assistance: Min guard;Min assist Ambulation Distance (Feet): 250 Feet Assistive device: None;1 person hand held assist Gait Pattern/deviations: Step-through pattern;Decreased stride length;Trunk flexed Gait velocity: Decreased Gait velocity interpretation: Below normal speed for age/gender General Gait Details: Initially pt ambulating without UE support. Appears slightly unsteady but did not require assist. As pt fatigued, required HHA for steadying.   Stairs            Wheelchair Mobility    Modified Rankin (Stroke Patients Only)       Balance Overall balance assessment: Needs assistance Sitting-balance support: No upper extremity supported;Feet supported Sitting balance-Leahy Scale: Good     Standing balance support: No upper extremity supported;During functional activity Standing balance-Leahy Scale: Fair                               Pertinent Vitals/Pain Pain Assessment: No/denies pain    Home Living Family/patient expects to be discharged to:: Private residence Living Arrangements: Alone Available Help at Discharge: Family;Available PRN/intermittently Type of Home: House Home Access: Stairs to enter Entrance Stairs-Rails: None Entrance Stairs-Number of Steps: 3 Home Layout: One level Home Equipment: Bedside commode;Walker - 2 wheels      Prior Function Level of Independence: Independent  Hand Dominance        Extremity/Trunk Assessment   Upper Extremity Assessment Upper Extremity Assessment: Generalized weakness    Lower Extremity Assessment Lower Extremity Assessment: Generalized weakness    Cervical / Trunk Assessment Cervical /  Trunk Assessment: Kyphotic  Communication   Communication: No difficulties  Cognition Arousal/Alertness: Awake/alert Behavior During Therapy: WFL for tasks assessed/performed Overall Cognitive Status: Within Functional Limits for tasks assessed                                        General Comments      Exercises     Assessment/Plan    PT Assessment Patient needs continued PT services  PT Problem List Decreased strength;Decreased activity tolerance;Decreased balance;Decreased mobility;Decreased safety awareness       PT Treatment Interventions DME instruction;Gait training;Stair training;Functional mobility training;Therapeutic activities;Therapeutic exercise;Neuromuscular re-education;Patient/family education    PT Goals (Current goals can be found in the Care Plan section)  Acute Rehab PT Goals Patient Stated Goal: Get stronger with HHPT at d/c PT Goal Formulation: With patient Time For Goal Achievement: 04/18/17 Potential to Achieve Goals: Good    Frequency Min 3X/week   Barriers to discharge        Co-evaluation               AM-PAC PT "6 Clicks" Daily Activity  Outcome Measure Difficulty turning over in bed (including adjusting bedclothes, sheets and blankets)?: None Difficulty moving from lying on back to sitting on the side of the bed? : None Difficulty sitting down on and standing up from a chair with arms (e.g., wheelchair, bedside commode, etc,.)?: None Help needed moving to and from a bed to chair (including a wheelchair)?: A Little Help needed walking in hospital room?: A Little Help needed climbing 3-5 steps with a railing? : A Little 6 Click Score: 21    End of Session Equipment Utilized During Treatment: Gait belt Activity Tolerance: Patient tolerated treatment well Patient left: in bed;with call bell/phone within reach Nurse Communication: Mobility status PT Visit Diagnosis: Unsteadiness on feet (R26.81);Muscle weakness  (generalized) (M62.81)    Time: 1020-1045 PT Time Calculation (min) (ACUTE ONLY): 25 min   Charges:   PT Evaluation $PT Eval Moderate Complexity: 1 Mod PT Treatments $Gait Training: 8-22 mins   PT G Codes:        Rolinda Roan, PT, DPT Acute Rehabilitation Services Pager: [phone]   Thelma Comp 04/04/2017, 12:10 PM

## 2017-04-04 NOTE — Sedation Documentation (Signed)
Patient is resting comfortably. 

## 2017-04-04 NOTE — Procedures (Signed)
LUL MASS  S/P CT LUL MASS 18 G CORE BX  No comp Stable EBL 0 PATH PENDING FULL REPORT IN PACS

## 2017-04-05 ENCOUNTER — Other Ambulatory Visit: Payer: Self-pay | Admitting: *Deleted

## 2017-04-05 DIAGNOSIS — R918 Other nonspecific abnormal finding of lung field: Secondary | ICD-10-CM

## 2017-04-05 NOTE — Progress Notes (Signed)
Received message from Lavella Lemons, Dr. Gearldine Shown RN that patient discharged from hospital following biopsy of lung mass and that patient needs to be seen by Dr. Julien Nordmann.  Discussed with Dr. Julien Nordmann that pathology pending and he said to schedule patient.  Referral order sent.

## 2017-04-09 ENCOUNTER — Telehealth: Payer: Self-pay | Admitting: *Deleted

## 2017-04-09 DIAGNOSIS — R918 Other nonspecific abnormal finding of lung field: Secondary | ICD-10-CM

## 2017-04-09 NOTE — Telephone Encounter (Signed)
Oncology Nurse Navigator Documentation  Oncology Nurse Navigator Flowsheets 04/09/2017  Navigator Location [facility]  Navigator Encounter Type Telephone/I called [name] and scheduled her to be seen Dr. Julien Nordmann on 04/11/17 with labs before.  She verbalized understanding of appt.   Telephone Outgoing Call  Confirmed Diagnosis Date 04/04/2017  Treatment Phase Pre-Tx/Tx Discussion  Barriers/Navigation Needs Coordination of Care  Interventions Coordination of Care  Coordination of Care Appts  Acuity Level 2  Acuity Level 2 Assistance expediting appointments  Time Spent with Patient 30

## 2017-04-11 ENCOUNTER — Telehealth: Payer: Self-pay

## 2017-04-11 ENCOUNTER — Other Ambulatory Visit (HOSPITAL_BASED_OUTPATIENT_CLINIC_OR_DEPARTMENT_OTHER): Payer: Medicare Other

## 2017-04-11 ENCOUNTER — Encounter: Payer: Self-pay | Admitting: *Deleted

## 2017-04-11 ENCOUNTER — Encounter: Payer: Self-pay | Admitting: Internal Medicine

## 2017-04-11 ENCOUNTER — Ambulatory Visit (HOSPITAL_BASED_OUTPATIENT_CLINIC_OR_DEPARTMENT_OTHER): Payer: Medicare Other | Admitting: Internal Medicine

## 2017-04-11 DIAGNOSIS — C3490 Malignant neoplasm of unspecified part of unspecified bronchus or lung: Secondary | ICD-10-CM

## 2017-04-11 DIAGNOSIS — C3412 Malignant neoplasm of upper lobe, left bronchus or lung: Secondary | ICD-10-CM

## 2017-04-11 DIAGNOSIS — R918 Other nonspecific abnormal finding of lung field: Secondary | ICD-10-CM

## 2017-04-11 DIAGNOSIS — R634 Abnormal weight loss: Secondary | ICD-10-CM

## 2017-04-11 DIAGNOSIS — F419 Anxiety disorder, unspecified: Secondary | ICD-10-CM | POA: Diagnosis not present

## 2017-04-11 LAB — COMPREHENSIVE METABOLIC PANEL
ALBUMIN: 2.9 g/dL — AB (ref 3.5–5.0)
ALK PHOS: 135 U/L (ref 40–150)
ALT: 16 U/L (ref 0–55)
ANION GAP: 8 meq/L (ref 3–11)
AST: 14 U/L (ref 5–34)
BUN: 22.6 mg/dL (ref 7.0–26.0)
CALCIUM: 9.9 mg/dL (ref 8.4–10.4)
CO2: 28 meq/L (ref 22–29)
Chloride: 96 mEq/L — ABNORMAL LOW (ref 98–109)
Creatinine: 1 mg/dL (ref 0.6–1.1)
EGFR: 54 mL/min/{1.73_m2} — ABNORMAL LOW (ref >60)
GLUCOSE: 121 mg/dL (ref 70–140)
Potassium: 4.4 mEq/L (ref 3.5–5.1)
Sodium: 132 mEq/L — ABNORMAL LOW (ref 136–145)
TOTAL PROTEIN: 7.2 g/dL (ref 6.4–8.3)
Total Bilirubin: 0.25 mg/dL (ref 0.20–1.20)

## 2017-04-11 LAB — CBC WITH DIFFERENTIAL/PLATELET
BASO%: 0.6 % (ref 0.0–2.0)
BASOS ABS: 0.1 10*3/uL (ref 0.0–0.1)
EOS ABS: 0.1 10*3/uL (ref 0.0–0.5)
EOS%: 1.6 % (ref 0.0–7.0)
HEMATOCRIT: 35.1 % (ref 34.8–46.6)
HEMOGLOBIN: 11.3 g/dL — AB (ref 11.6–15.9)
LYMPH#: 1.1 10*3/uL (ref 0.9–3.3)
LYMPH%: 12.3 % — ABNORMAL LOW (ref 14.0–49.7)
MCH: 28.4 pg (ref 25.1–34.0)
MCHC: 32.2 g/dL (ref 31.5–36.0)
MCV: 88.2 fL (ref 79.5–101.0)
MONO#: 0.6 10*3/uL (ref 0.1–0.9)
MONO%: 6.9 % (ref 0.0–14.0)
NEUT#: 6.9 10*3/uL — ABNORMAL HIGH (ref 1.5–6.5)
NEUT%: 78.6 % — ABNORMAL HIGH (ref 38.4–76.8)
PLATELETS: 396 10*3/uL (ref 145–400)
RBC: 3.98 10*6/uL (ref 3.70–5.45)
RDW: 13.9 % (ref 11.2–14.5)
WBC: 8.8 10*3/uL (ref 3.9–10.3)

## 2017-04-11 MED ORDER — ALPRAZOLAM 0.25 MG PO TABS: 0.2500 mg | ORAL_TABLET | Freq: Every evening | ORAL | 0 refills | Status: DC | PRN

## 2017-04-11 NOTE — Progress Notes (Signed)
Oncology Nurse Navigator Documentation  Oncology Nurse Navigator Flowsheets 04/11/2017  Navigator Location [facility]  Navigator Encounter Type Other/per Dr. Julien Nordmann, I requested PDL 1 testing on Ms. Rust's recent biopsy.   Treatment Phase Pre-Tx/Tx Discussion  Barriers/Navigation Needs Coordination of Care  Interventions Coordination of Care  Coordination of Care Other  Acuity Level 2  Time Spent with Patient 15

## 2017-04-11 NOTE — Progress Notes (Signed)
[place] [phone]   [phone]  CONSULT NOTE  REFERRING PHYSICIAN: Dr. Claris Gower  REASON FOR CONSULTATION:  83 years old white female recently diagnosed with lung cancer.  HPI [name] is a 83 y.o. female with past medical history significant for coronary artery disease, myocardial infarction, hiatal hernia, carotid artery occlusion, stroke in 2000, gallbladder stones, hypercholesterolemia as well as history of hypertension which is currently under control.  The patient also has a long history for smoking.  She presented to the emergency department at [facility] complaining of 3 weeks history of weakness, fatigue as well as lack of appetite and weight loss.  During her evaluation she had a chest x-ray performed on March 30, 2017 and that showed soft tissue masslike density in the left apex suspicious for underlying neoplasm.  This was followed by CT scan of the chest with contrast on the same day and that showed spiculated irregular solid 6.4 cm posterior apical left upper lobe lung mass highly suspicious for primary bronchogenic carcinoma with suggestion of early invasion of the left paraspinal soft tissue at the T3-4 level without appreciable osseous invasion.  There was also ipsilateral hilar and ipsilateral mediastinal lymphadenopathy suspicious for nodal metastatic disease.  CT scan of the abdomen and pelvis on April 03, 2017 showed 1.8 cm left adrenal nodule questionable for adenoma.  On April 04, 2017 the patient had CT-guided core biopsy of the left upper lobe lung mass by interventional radiology.  The final pathology ([id number]) showed squamous cell carcinoma of the lung. The patient was referred to me today for evaluation and recommendation regarding treatment of her condition. When seen today she is feeling fine except for cough productive of yellowish sputum and she is currently on treatment with Mucinex.  She  denied having any chest pain, shortness breath or hemoptysis.  She lost around 14 pounds in the last 12 months.  She denied having any nausea, vomiting, diarrhea or constipation.  She denied having any headache or visual changes. Family history significant for mother who died from heart disease and stroke, father died from pneumonia and sister from heart disease. The patient is a widow and has 3 children and 4 stepchildren.  She was accompanied today by her stepdaughter [name].  The patient used to work as a Radiation protection practitioner.  She has a history for smoking 1 pack/day for around 7 years and she quit 2 weeks ago.  She also drinks wine every night.  She denied having any history of drug abuse.  HPI  Past Medical History:  Diagnosis Date  . Abdominal aortic atherosclerosis ([place])   . CAD (coronary artery disease)   . Carotid artery occlusion   . Common bile duct dilation   . Gallstones   . Headache(784.0)   . Hiatal hernia   . HLD (hyperlipidemia)   . Myocardial infarction ([place])   . PONV (postoperative nausea and vomiting)   . Stroke [facility]) 2003    Past Surgical History:  Procedure Laterality Date  . ABDOMINAL HYSTERECTOMY    . ANKLE FRACTURE SURGERY    . APPENDECTOMY    . BLADDER SURGERY    . CHOLECYSTECTOMY N/A 05/05/2013   Procedure: LAPAROSCOPIC CHOLECYSTECTOMY WITH INTRAOPERATIVE CHOLANGIOGRAM;  Surgeon: Ralene Ok, MD;  Location: [place];  Service: General;  Laterality: N/A;  . HIP ARTHROPLASTY Right 01/10/2014   Procedure: RIGHT HIP HEMIARTHROPLASTY;  Surgeon: Johnn Hai, MD;  Location: [facility];  Service: Orthopedics;  Laterality: Right;  DEPUY AML LATERAL WITH MARK II  . ROTATOR CUFF REPAIR Left   . TONSILLECTOMY      Family History  Problem Relation Age of Onset  . Stroke Mother   . Breast cancer Maternal Aunt   . Prostate cancer Maternal Grandfather     Social History Social History   Tobacco Use  . Smoking status: Former Smoker    Types: Cigarettes    Last attempt  to quit: 03/24/2017    Years since quitting: 0.0  . Smokeless tobacco: Never Used  Substance Use Topics  . Alcohol use: No  . Drug use: No    Allergies  Allergen Reactions  . Dextroamphetamine Sulfate Er Other (See Comments)    Euphoric feeling  . Other     Other reaction(s): Other (See Comments) Euphoric feeling    Current Outpatient Medications  Medication Sig Dispense Refill  . aspirin 81 MG EC tablet Take 81 mg by mouth daily.      . Calcium Carbonate-Vitamin D (CALCIUM + D PO) Take 1 tablet by mouth daily.    . feeding supplement, ENSURE ENLIVE, (ENSURE ENLIVE) LIQD Take 237 mLs 2 (two) times daily between meals by mouth. 30 Bottle 0  . Glucosamine-Chondroitin (GLUCOSAMINE CHONDR COMPLEX PO) Take 1 capsule by mouth daily.    Marland Kitchen guaiFENesin (MUCINEX) 600 MG 12 hr tablet Take 2 tablets (1,200 mg total) 2 (two) times daily by mouth. 30 tablet 0  . lisinopril (PRINIVIL,ZESTRIL) 20 MG tablet Take 1 tablet (20 mg total) daily by mouth. 30 tablet 0  . Multiple Vitamin (MULTIVITAMIN WITH MINERALS) TABS tablet Take 1 tablet by mouth at bedtime.    . nitroGLYCERIN (NITROSTAT) 0.4 MG SL tablet Place 0.4 mg under the tongue every 5 (five) minutes as needed for chest pain.    . polyethylene glycol (MIRALAX / GLYCOLAX) packet Take 17 g daily as needed by mouth. 14 each 0   No current facility-administered medications for this visit.     Review of Systems  Constitutional: positive for anorexia, fatigue and weight loss Eyes: negative Ears, nose, mouth, throat, and face: negative Respiratory: positive for cough and sputum Cardiovascular: negative Gastrointestinal: negative Genitourinary:negative Integument/breast: negative Hematologic/lymphatic: negative Musculoskeletal:negative Neurological: negative Behavioral/Psych: negative Endocrine: negative Allergic/Immunologic: negative  Physical Exam  ERX:VQMGQ, healthy, no distress, well nourished, well developed and anxious SKIN: skin  color, texture, turgor are normal, no rashes or significant lesions HEAD: Normocephalic, No masses, lesions, tenderness or abnormalities EYES: normal, PERRLA, Conjunctiva are pink and non-injected EARS: External ears normal, Canals clear OROPHARYNX:no exudate, no erythema and lips, buccal mucosa, and tongue normal  NECK: supple, no adenopathy, no JVD LYMPH:  no palpable lymphadenopathy, no hepatosplenomegaly BREAST:not examined LUNGS: clear to auscultation , and palpation HEART: regular rate & rhythm, no murmurs and no gallops ABDOMEN:abdomen soft, non-tender, normal bowel sounds and no masses or organomegaly BACK: Back symmetric, no curvature., No CVA tenderness EXTREMITIES:no joint deformities, effusion, or inflammation, no edema, no skin discoloration  NEURO: alert & oriented x 3 with fluent speech, no focal motor/sensory deficits  PERFORMANCE STATUS: ECOG 1  LABORATORY DATA: Lab Results  Component Value Date   WBC 8.8 04/11/2017   HGB 11.3 (L) 04/11/2017   HCT 35.1 04/11/2017   MCV 88.2 04/11/2017   PLT 396 04/11/2017      Chemistry      Component Value Date/Time   NA 132 (L) 04/11/2017 1100   K 4.4 04/11/2017 1100   CL 99 (L) 04/04/2017 0539   CO2  28 04/11/2017 1100   BUN 22.6 04/11/2017 1100   CREATININE 1.0 04/11/2017 1100      Component Value Date/Time   CALCIUM 9.9 04/11/2017 1100   ALKPHOS 135 04/11/2017 1100   AST 14 04/11/2017 1100   ALT 16 04/11/2017 1100   BILITOT 0.25 04/11/2017 1100       RADIOGRAPHIC STUDIES: Dg Chest 2 View  Result Date: 03/30/2017 CLINICAL DATA:  Decreased appetite and weakness EXAM: CHEST  2 VIEW COMPARISON:  01/09/2014 FINDINGS: Cardiac shadow is within normal limits. The lungs are well aerated bilaterally. A lobulated soft tissue density is noted in the left lung apex new from the prior exam and highly suspicious for underlying neoplasm. Aortic calcifications are seen. No other focal abnormality is noted. IMPRESSION: Soft tissue  masslike density in the left apex suspicious for underlying neoplasm. CT of the chest with contrast is recommended for further evaluation. These results were called by telephone at the time of interpretation on 03/30/2017 at 3:58 pm to Dr. Carmin Muskrat , who verbally acknowledged these results. Electronically Signed   By: Inez Catalina M.D.   On: 03/30/2017 15:58   Ct Chest W Contrast  Result Date: 03/30/2017 CLINICAL DATA:  New masslike opacity at the left lung apex on chest radiograph EXAM: CT CHEST WITH CONTRAST TECHNIQUE: Multidetector CT imaging of the chest was performed during intravenous contrast administration. CONTRAST:  15mL ISOVUE-300 IOPAMIDOL (ISOVUE-300) INJECTION 61% COMPARISON:  Chest radiograph from earlier today. FINDINGS: Cardiovascular: Normal heart size. No significant pericardial fluid/thickening. Left main, left anterior descending, left circumflex and right coronary atherosclerosis. Atherosclerotic nonaneurysmal thoracic aorta, noting extensive ulcerated plaque throughout the aorta. Normal caliber pulmonary arteries. No central pulmonary emboli. Mediastinum/Nodes: Subcentimeter hypodense left thyroid lobe nodule. Unremarkable esophagus. No axillary adenopathy. Mildly enlarged 1.0 cm left paratracheal node (series 2/ image 40). Mildly enlarged 1.2 cm left suprahilar node (series 2/ image 39). No additional pathologically enlarged mediastinal or hilar nodes. Lungs/Pleura: No pneumothorax. No pleural effusion. Mild centrilobular and paraseptal emphysema. Spiculated irregular solid 6.4 x 5.3 cm posterior apical left upper lobe lung mass (series 3/image 15), with suggestion of early invasion of the left paraspinal soft tissues at the T3-4 level (series 2/ image 23 and series 4/ image 58). No definite osseous invasion. Punctate calcified granulomas in the right upper lung. Otherwise no significant pulmonary nodules. Upper abdomen: Indeterminate 1.5 cm left adrenal nodule with density 65 HU  (series 2/ image 126). Musculoskeletal: No aggressive appearing focal osseous lesions. Mild thoracic spondylosis. IMPRESSION: 1. Spiculated irregular solid 6.4 cm posterior apical left upper lobe lung mass, highly suspicious for primary bronchogenic carcinoma. Suggestion of early invasion of the left paraspinal soft tissues at the T3-4 level, without appreciable osseous invasion. 2. Ipsilateral hilar and ipsilateral mediastinal lymphadenopathy, suspicious for nodal metastatic disease. 3. Indeterminate left adrenal nodule, cannot exclude left adrenal metastasis. 4. Multidisciplinary thoracic oncology consultation and PET-CT recommended . 5. Left main and 3 vessel coronary atherosclerosis. Aortic Atherosclerosis (ICD10-I70.0) and Emphysema (ICD10-J43.9). Electronically Signed   By: Ilona Sorrel M.D.   On: 03/30/2017 17:05   Ct Abdomen Pelvis W Contrast  Result Date: 04/03/2017 CLINICAL DATA:  Lung mass.  Evaluate for metastatic disease. EXAM: CT ABDOMEN AND PELVIS WITH CONTRAST TECHNIQUE: Multidetector CT imaging of the abdomen and pelvis was performed using the standard protocol following bolus administration of intravenous contrast. CONTRAST:  16mL ISOVUE-300 IOPAMIDOL (ISOVUE-300) INJECTION 61% COMPARISON:  CT chest 03/30/2017 FINDINGS: Lower chest:  Emphysema noted in the lung bases. Hepatobiliary: Focus  of hypo attenuation in the medial segment left liver adjacent to the falciform ligament is in a characteristic location for focal fatty deposition. Liver parenchyma otherwise unremarkable. Gallbladder surgically absent. No intrahepatic or extrahepatic biliary dilation. Pancreas: No focal mass lesion. No dilatation of the main duct. No intraparenchymal cyst. No peripancreatic edema. Spleen: No splenomegaly. No focal mass lesion. Adrenals/Urinary Tract: 10 x 18 mm left adrenal nodule evident with relative washout value of 47%. There is thickening of the right adrenal gland. Kidneys are unremarkable. No  evidence for hydroureter. Bladder largely obscured by streak artifact from the left hip replacement. Stomach/Bowel: Stomach is nondistended. No gastric wall thickening. No evidence of outlet obstruction. Duodenum is normally positioned as is the ligament of Treitz. No small bowel wall thickening. No small bowel dilatation. The terminal ileum is normal. The appendix is not visualized, but there is no edema or inflammation in the region of the cecum. Diverticular changes are noted in the sigmoid colon without features of diverticulitis. Vascular/Lymphatic: There is abdominal aortic atherosclerosis without aneurysm. There is no gastrohepatic or hepatoduodenal ligament lymphadenopathy. No intraperitoneal or retroperitoneal lymphadenopathy. No pelvic sidewall lymphadenopathy. Reproductive: Obscured by streak artifact from right hip replacement. Other: No intraperitoneal free fluid. Musculoskeletal: Status post right hip replacement. Bone windows reveal no worrisome lytic or sclerotic osseous lesions. IMPRESSION: 1. No definite findings of metastatic disease in the abdomen or pelvis. 2. 10 x 18 mm left adrenal nodule with relative washout of 47%. This is borderline for adenoma. Attention on follow-up imaging recommended. If further characterization is required at this time, abdominal MRI with in and out of phase T1 imaging may be able to more definitively characterize this nodule. 3. Small area of hypo attenuation in the left liver, fairly characteristic for focal fatty deposition along the falciform ligament. Attention on follow-up imaging recommended. 4.  Aortic Atherosclerois (ICD10-170.0) Electronically Signed   By: Misty Stanley M.D.   On: 04/03/2017 09:16   Ct Biopsy  Result Date: 04/04/2017 INDICATION: Left upper lobe mass EXAM: CT-GUIDED BIOPSY LARGE LEFT UPPER LOBE MASS MEDICATIONS: 1% LIDOCAINE LOCAL ANESTHESIA/SEDATION: 1.0 mg IV Versed; 25 mcg IV Fentanyl Moderate Sedation Time:  15 MINUTES The patient  was continuously monitored during the procedure by the interventional radiology nurse under my direct supervision. PROCEDURE: The procedure, risks, benefits, and alternatives were explained to the patient. Questions regarding the procedure were encouraged and answered. The patient understands and consents to the procedure. previous imaging reviewed. patient position left side down decubitus. noncontrast localization ct performed. the large left upper lobe posterior apical mass was localized. Overlying skin marked. Under sterile conditions and local anesthesia, a 17 gauge coaxial guide needle was advanced from a posterior intercostal approach into the left upper lobe mass medially. Needle position confirmed with CT. 3 18 gauge core biopsies obtained. Samples placed in formalin. Needle tract occlusion performed with the biosentry device. Postprocedure imaging demonstrates no hemorrhage or hematoma. No pneumothorax. Patient tolerated the procedure well without complication. Vital sign monitoring by nursing staff during the procedure will continue as patient is in the special procedures unit for post procedure observation. FINDINGS: The images document guide needle placement within the large left upper lobe mass. Post biopsy images demonstrate no evidence of pneumothorax or effusion. COMPLICATIONS: None immediate. IMPRESSION: Successful CT-guided core biopsy of the left upper lobe mass Electronically Signed   By: Jerilynn Mages.  Shick M.D.   On: 04/04/2017 15:45   Dg Chest Port 1 View  Result Date: 04/04/2017 CLINICAL DATA:  Upper lobe  lung mass. EXAM: PORTABLE CHEST 1 VIEW COMPARISON:  CT chest 03/30/2017. FINDINGS: Normal cardiomediastinal silhouette. LEFT upper lobe mass, similar to priors. No significant post biopsy enlargement or hemothorax. No pneumothorax. Lung fields otherwise clear. IMPRESSION: Satisfactory post biopsy appearance. Electronically Signed   By: Staci Righter M.D.   On: 04/04/2017 19:09    ASSESSMENT:  This is a very pleasant 83 years old white female recently diagnosed with at least stage IIIa (T2b, N2, M0) non-small cell lung cancer, squamous cell carcinoma presented with left upper lobe lung mass left hilar and mediastinal lymphadenopathy diagnosed in November 2018.   PLAN: I had a lengthy discussion with the patient and her stepdaughter today about her current disease is stage, prognosis and treatment options. I recommended for the patient to complete the staging workup by ordering a PET scan as well as MRI of the brain.  She refused MRI of the brain and accepted only the CT scan of the head. I will also ask the pathology department to send the tissue block for PDL 1 expression. I discussed with the patient her treatment options and if she has no evidence of metastatic disease on the PET scan or CT of the head, we would consider the patient for a course of concurrent chemoradiation with weekly carboplatin for AUC of 2 1 paclitaxel 45 mg/M2. I will arrange for the patient to come back for follow-up visit in 2 weeks for reevaluation and more detailed discussion of this treatment options based on the final staging workup and PDL 1 expression. I would also refer the patient to radiation oncology for evaluation and discussion of the radiotherapy option. For the weight loss, I referred the patient to the dietitian at the cancer center for evaluation and management For anxiety, I gave the patient a prescription for Xanax 0.25 mg p.o. nightly as needed. The patient was advised to call immediately if she has any concerning symptoms in the interval. The patient voices understanding of current disease status and treatment options and is in agreement with the current care plan.  All questions were answered. The patient knows to call the clinic with any problems, questions or concerns. We can certainly see the patient much sooner if necessary.  Thank you so much for allowing me to participate in the care of  AAILYAH DUNBAR. I will continue to follow up the patient with you and assist in her care.  I spent 40 minutes counseling the patient face to face. The total time spent in the appointment was 60 minutes.  Disclaimer: This note was dictated with voice recognition software. Similar sounding words can inadvertently be transcribed and may not be corrected upon review.   Eilleen Kempf April 11, 2017, 12:16 PM

## 2017-04-11 NOTE — Telephone Encounter (Signed)
Printed avs and calender for upcoming appointment. Per 11/21 los.

## 2017-04-13 ENCOUNTER — Telehealth: Payer: Self-pay | Admitting: *Deleted

## 2017-04-13 ENCOUNTER — Telehealth: Payer: Self-pay | Admitting: Internal Medicine

## 2017-04-13 NOTE — Telephone Encounter (Signed)
Oncology Nurse Navigator Documentation  Oncology Nurse Navigator Flowsheets 04/13/2017  Navigator Location [facility]  Navigator Encounter Type Telephone/I followed up on [name] appt.  She is scheduled to have PET and CT head on 12/7.  I called [name] and scheduled her for a follow up appt with Dr. Julien Nordmann. She verbalized understanding of appt   Telephone Outgoing Call  Treatment Phase Pre-Tx/Tx Discussion  Barriers/Navigation Needs Coordination of Care  Interventions Coordination of Care  Coordination of Care Appts  Acuity Level 1  Time Spent with Patient 30

## 2017-04-13 NOTE — Telephone Encounter (Signed)
Tessa Lerner, RN from home care called to say patient is having increased anxiety. Is wondering if Xanax can be changed from daily at bedtime to BID as needed for anxiety.    Dr Julien Nordmann states it is OK to take BID, but no more often. Tessa Lerner notified

## 2017-04-13 NOTE — Telephone Encounter (Signed)
Per sch msg he was already schedule for nut

## 2017-04-16 ENCOUNTER — Ambulatory Visit: Payer: Medicare Other | Admitting: Nutrition

## 2017-04-16 ENCOUNTER — Encounter: Payer: Self-pay | Admitting: Radiation Oncology

## 2017-04-16 NOTE — Progress Notes (Signed)
83 year old female diagnosed with non-small cell lung cancer.  She is a patient of Dr. Julien Nordmann.  Patient is currently being worked up for evaluation of metastatic disease.  New  Past medical history includes AAA, CAD, hiatal hernia, hyperlipidemia, MI, stroke.  Medications include Xanax, calcium with vitamin D, multivitamin, and MiraLAX.  Labs include sodium 132 and albumin 2.9.  Height: 5 feet 1 inch. Weight: 122.2 pounds November 21. Usual body weight: 136 pounds. BMI: 23.09.  Patient reports she has followed a low-fat, low-sodium and low-cholesterol diet since she had her MI, stroke 15 years ago. She is concerned because her oncologist has told her she can have whatever she wants to eat. Patient reports increased fatigue. Patient endorses 13 pound weight loss and and early satiety. Patient verbalizes anxiety over upcoming tests and treatment.  Nutrition diagnosis:  Unintended weight loss related to new diagnosis of lung cancer as evidenced by 10% weight loss from usual body weight.  Intervention: Patient educated to continue small frequent meals with adequate calories and high protein foods. I discouraged further weight loss. Educated patient that her diet restrictions can be relaxed during this treatment for lung cancer and how it is important for her to maintain her weight. Provided patient with samples of ensure, coupons, and fact sheets. Questions were answered.  Teach back method used.  Monitoring, evaluation, goals: Patient will tolerate increased calories and protein to minimize weight loss.  Next visit: I will follow patient as needed.  **Disclaimer: This note was dictated with voice recognition software. Similar sounding words can inadvertently be transcribed and this note may contain transcription errors which may not have been corrected upon publication of note.**

## 2017-04-20 ENCOUNTER — Encounter (HOSPITAL_COMMUNITY): Payer: Self-pay

## 2017-04-27 ENCOUNTER — Ambulatory Visit (HOSPITAL_COMMUNITY)
Admission: RE | Admit: 2017-04-27 | Discharge: 2017-04-27 | Disposition: A | Discharge status: Home / Self Care | Payer: Medicare Other | Source: Ambulatory Visit | Attending: Internal Medicine | Admitting: Internal Medicine

## 2017-04-27 ENCOUNTER — Encounter (HOSPITAL_COMMUNITY): Payer: Self-pay | Admitting: Radiology

## 2017-04-27 DIAGNOSIS — Z79899 Other long term (current) drug therapy: Secondary | ICD-10-CM | POA: Diagnosis not present

## 2017-04-27 DIAGNOSIS — I6523 Occlusion and stenosis of bilateral carotid arteries: Secondary | ICD-10-CM | POA: Insufficient documentation

## 2017-04-27 DIAGNOSIS — I700 Atherosclerosis of aorta: Secondary | ICD-10-CM | POA: Insufficient documentation

## 2017-04-27 DIAGNOSIS — M1288 Other specific arthropathies, not elsewhere classified, other specified site: Secondary | ICD-10-CM | POA: Diagnosis not present

## 2017-04-27 DIAGNOSIS — K5730 Diverticulosis of large intestine without perforation or abscess without bleeding: Secondary | ICD-10-CM | POA: Insufficient documentation

## 2017-04-27 DIAGNOSIS — M461 Sacroiliitis, not elsewhere classified: Secondary | ICD-10-CM | POA: Insufficient documentation

## 2017-04-27 DIAGNOSIS — G319 Degenerative disease of nervous system, unspecified: Secondary | ICD-10-CM | POA: Diagnosis not present

## 2017-04-27 DIAGNOSIS — Z96641 Presence of right artificial hip joint: Secondary | ICD-10-CM | POA: Diagnosis not present

## 2017-04-27 DIAGNOSIS — C3490 Malignant neoplasm of unspecified part of unspecified bronchus or lung: Secondary | ICD-10-CM | POA: Diagnosis not present

## 2017-04-27 DIAGNOSIS — R590 Localized enlarged lymph nodes: Secondary | ICD-10-CM | POA: Diagnosis not present

## 2017-04-27 DIAGNOSIS — R9082 White matter disease, unspecified: Secondary | ICD-10-CM | POA: Insufficient documentation

## 2017-04-27 LAB — GLUCOSE, CAPILLARY: GLUCOSE-CAPILLARY: 110 mg/dL — AB (ref 65–99)

## 2017-04-27 MED ORDER — FLUDEOXYGLUCOSE F - 18 (FDG) INJECTION
6.6000 | Freq: Once | INTRAVENOUS | Status: AC | PRN
  Administered 2017-04-27: 6.6 via INTRAVENOUS

## 2017-04-27 MED ORDER — IOPAMIDOL (ISOVUE-300) INJECTION 61%
75.0000 mL | Freq: Once | INTRAVENOUS | Status: AC | PRN
  Administered 2017-04-27: 75 mL via INTRAVENOUS

## 2017-04-27 MED ORDER — IOPAMIDOL (ISOVUE-300) INJECTION 61%
INTRAVENOUS | Status: AC
  Filled 2017-04-27: qty 75

## 2017-04-27 MED ORDER — SODIUM PERTECHNETATE TC 99M INJECTION: 6.6000 | Freq: Once | INTRAVENOUS | Status: DC | PRN

## 2017-04-27 NOTE — Progress Notes (Signed)
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 Julien Nordmann 04/11/17, scheduled Pet and Ct head for 04/27/17, follow up 05/01/17  Signs/Symptoms  Weight changes, if any:14 lbs loss  Saw Ernestene Kiel 04/17/15  Respiratory complaints, if any:   Hemoptysis, if any:   Pain issues, if any:    SAFETY ISSUES:  Prior radiation? NO  Pacemaker/ICD? NO  Is the patient on methotrexate?   Current Complaints / other details:  HX MI, CAD,Stroke 2000,Carotid artery occlusion,HTN,Anxiety Mother heart disease and stroke deceased,Fther deceased pneumonia,sister heart disease, Maternal Aunt breast cancer, Maternal grandfather prostate cancer,   Allergies: Dextroamphetamine Sulfate Er-Euphoric feeling,

## 2017-05-01 ENCOUNTER — Other Ambulatory Visit: Payer: Medicare Other

## 2017-05-01 ENCOUNTER — Ambulatory Visit: Payer: Medicare Other

## 2017-05-01 ENCOUNTER — Telehealth: Payer: Self-pay | Admitting: *Deleted

## 2017-05-01 ENCOUNTER — Ambulatory Visit: Payer: Medicare Other | Admitting: Internal Medicine

## 2017-05-01 ENCOUNTER — Ambulatory Visit
Admission: RE | Admit: 2017-05-01 | Discharge: 2017-05-01 | Disposition: A | Discharge status: Home / Self Care | Payer: Medicare Other | Source: Ambulatory Visit | Attending: Radiation Oncology | Admitting: Radiation Oncology

## 2017-05-01 DIAGNOSIS — Z9221 Personal history of antineoplastic chemotherapy: Secondary | ICD-10-CM | POA: Insufficient documentation

## 2017-05-01 DIAGNOSIS — F17210 Nicotine dependence, cigarettes, uncomplicated: Secondary | ICD-10-CM | POA: Insufficient documentation

## 2017-05-01 DIAGNOSIS — Z7982 Long term (current) use of aspirin: Secondary | ICD-10-CM | POA: Insufficient documentation

## 2017-05-01 DIAGNOSIS — I700 Atherosclerosis of aorta: Secondary | ICD-10-CM | POA: Insufficient documentation

## 2017-05-01 DIAGNOSIS — R599 Enlarged lymph nodes, unspecified: Secondary | ICD-10-CM | POA: Insufficient documentation

## 2017-05-01 DIAGNOSIS — Z803 Family history of malignant neoplasm of breast: Secondary | ICD-10-CM | POA: Insufficient documentation

## 2017-05-01 DIAGNOSIS — Z79899 Other long term (current) drug therapy: Secondary | ICD-10-CM | POA: Insufficient documentation

## 2017-05-01 DIAGNOSIS — Z8042 Family history of malignant neoplasm of prostate: Secondary | ICD-10-CM | POA: Insufficient documentation

## 2017-05-01 DIAGNOSIS — Z510 Encounter for antineoplastic radiation therapy: Secondary | ICD-10-CM | POA: Insufficient documentation

## 2017-05-01 DIAGNOSIS — Z8673 Personal history of transient ischemic attack (TIA), and cerebral infarction without residual deficits: Secondary | ICD-10-CM | POA: Insufficient documentation

## 2017-05-01 DIAGNOSIS — I2510 Atherosclerotic heart disease of native coronary artery without angina pectoris: Secondary | ICD-10-CM | POA: Insufficient documentation

## 2017-05-01 DIAGNOSIS — E222 Syndrome of inappropriate secretion of antidiuretic hormone: Secondary | ICD-10-CM | POA: Insufficient documentation

## 2017-05-01 DIAGNOSIS — K449 Diaphragmatic hernia without obstruction or gangrene: Secondary | ICD-10-CM | POA: Insufficient documentation

## 2017-05-01 DIAGNOSIS — I252 Old myocardial infarction: Secondary | ICD-10-CM | POA: Insufficient documentation

## 2017-05-01 DIAGNOSIS — E785 Hyperlipidemia, unspecified: Secondary | ICD-10-CM | POA: Insufficient documentation

## 2017-05-01 DIAGNOSIS — C3412 Malignant neoplasm of upper lobe, left bronchus or lung: Secondary | ICD-10-CM

## 2017-05-01 DIAGNOSIS — I6529 Occlusion and stenosis of unspecified carotid artery: Secondary | ICD-10-CM | POA: Insufficient documentation

## 2017-05-01 NOTE — Telephone Encounter (Signed)
CALLED AND LEFT VM , PATIENT MISSED HER APPT 2:20PM, asked that she call to reschedule 2:47 PM

## 2017-05-02 ENCOUNTER — Telehealth: Payer: Self-pay | Admitting: Internal Medicine

## 2017-05-02 ENCOUNTER — Telehealth: Payer: Self-pay | Admitting: *Deleted

## 2017-05-02 NOTE — Telephone Encounter (Signed)
Oncology Nurse Navigator Documentation  Oncology Nurse Navigator Flowsheets 05/02/2017  Navigator Location [facility]  Navigator Encounter Type Telephone/I received a vm message from patient's daughter regarding re-scheduling. I called her back. I updated her that I will updated scheduling team to call her with an appt. She was thankful for the call back. I will send scheduling a message.   Telephone Incoming Call;Outgoing Call  Treatment Phase Treatment  Barriers/Navigation Needs Coordination of Care;Education  Education Other  Interventions Education;Coordination of Care  Coordination of Care Other  Education Method Verbal  Acuity Level 2  Time Spent with Patient 30

## 2017-05-02 NOTE — Telephone Encounter (Signed)
Scheduled appt per 12/12 sch msg - spoke with daughter regarding appts.

## 2017-05-03 ENCOUNTER — Telehealth: Payer: Self-pay | Admitting: Oncology

## 2017-05-03 ENCOUNTER — Ambulatory Visit
Admission: RE | Admit: 2017-05-03 | Discharge: 2017-05-03 | Disposition: A | Discharge status: Home / Self Care | Payer: Medicare Other | Source: Ambulatory Visit | Attending: Radiation Oncology | Admitting: Radiation Oncology

## 2017-05-03 ENCOUNTER — Other Ambulatory Visit: Payer: Self-pay | Admitting: Oncology

## 2017-05-03 ENCOUNTER — Encounter: Payer: Self-pay | Admitting: Radiation Oncology

## 2017-05-03 ENCOUNTER — Encounter: Payer: Self-pay | Admitting: General Practice

## 2017-05-03 ENCOUNTER — Ambulatory Visit
Admission: RE | Admit: 2017-05-03 | Payer: Medicare Other | Source: Ambulatory Visit | Attending: Radiation Oncology | Admitting: Radiation Oncology

## 2017-05-03 ENCOUNTER — Other Ambulatory Visit: Payer: Self-pay

## 2017-05-03 ENCOUNTER — Ambulatory Visit (HOSPITAL_BASED_OUTPATIENT_CLINIC_OR_DEPARTMENT_OTHER): Payer: Medicare Other | Admitting: Oncology

## 2017-05-03 ENCOUNTER — Other Ambulatory Visit (HOSPITAL_BASED_OUTPATIENT_CLINIC_OR_DEPARTMENT_OTHER): Payer: Medicare Other

## 2017-05-03 ENCOUNTER — Other Ambulatory Visit: Payer: Self-pay | Admitting: Internal Medicine

## 2017-05-03 VITALS — BP 155/67 | HR 69 | Temp 98.2°F | Resp 20 | Wt 125.3 lb

## 2017-05-03 DIAGNOSIS — Z79899 Other long term (current) drug therapy: Secondary | ICD-10-CM | POA: Diagnosis not present

## 2017-05-03 DIAGNOSIS — Z5111 Encounter for antineoplastic chemotherapy: Secondary | ICD-10-CM

## 2017-05-03 DIAGNOSIS — C3412 Malignant neoplasm of upper lobe, left bronchus or lung: Secondary | ICD-10-CM | POA: Insufficient documentation

## 2017-05-03 DIAGNOSIS — F17210 Nicotine dependence, cigarettes, uncomplicated: Secondary | ICD-10-CM | POA: Diagnosis not present

## 2017-05-03 DIAGNOSIS — I700 Atherosclerosis of aorta: Secondary | ICD-10-CM | POA: Diagnosis not present

## 2017-05-03 DIAGNOSIS — R918 Other nonspecific abnormal finding of lung field: Secondary | ICD-10-CM

## 2017-05-03 DIAGNOSIS — Z7982 Long term (current) use of aspirin: Secondary | ICD-10-CM | POA: Diagnosis not present

## 2017-05-03 DIAGNOSIS — Z8042 Family history of malignant neoplasm of prostate: Secondary | ICD-10-CM | POA: Diagnosis not present

## 2017-05-03 DIAGNOSIS — K449 Diaphragmatic hernia without obstruction or gangrene: Secondary | ICD-10-CM | POA: Diagnosis not present

## 2017-05-03 DIAGNOSIS — Z803 Family history of malignant neoplasm of breast: Secondary | ICD-10-CM | POA: Diagnosis not present

## 2017-05-03 DIAGNOSIS — I2510 Atherosclerotic heart disease of native coronary artery without angina pectoris: Secondary | ICD-10-CM | POA: Diagnosis not present

## 2017-05-03 DIAGNOSIS — Z510 Encounter for antineoplastic radiation therapy: Secondary | ICD-10-CM | POA: Diagnosis not present

## 2017-05-03 DIAGNOSIS — I6529 Occlusion and stenosis of unspecified carotid artery: Secondary | ICD-10-CM | POA: Diagnosis not present

## 2017-05-03 DIAGNOSIS — C3492 Malignant neoplasm of unspecified part of left bronchus or lung: Secondary | ICD-10-CM

## 2017-05-03 DIAGNOSIS — I252 Old myocardial infarction: Secondary | ICD-10-CM | POA: Diagnosis not present

## 2017-05-03 DIAGNOSIS — E222 Syndrome of inappropriate secretion of antidiuretic hormone: Secondary | ICD-10-CM | POA: Diagnosis not present

## 2017-05-03 DIAGNOSIS — Z8673 Personal history of transient ischemic attack (TIA), and cerebral infarction without residual deficits: Secondary | ICD-10-CM | POA: Diagnosis not present

## 2017-05-03 DIAGNOSIS — Z9221 Personal history of antineoplastic chemotherapy: Secondary | ICD-10-CM | POA: Diagnosis not present

## 2017-05-03 DIAGNOSIS — E785 Hyperlipidemia, unspecified: Secondary | ICD-10-CM | POA: Diagnosis not present

## 2017-05-03 DIAGNOSIS — Z7189 Other specified counseling: Secondary | ICD-10-CM

## 2017-05-03 DIAGNOSIS — R599 Enlarged lymph nodes, unspecified: Secondary | ICD-10-CM | POA: Diagnosis not present

## 2017-05-03 LAB — COMPREHENSIVE METABOLIC PANEL
ALT: 9 U/L (ref 0–55)
AST: 11 U/L (ref 5–34)
Albumin: 2.8 g/dL — ABNORMAL LOW (ref 3.5–5.0)
Alkaline Phosphatase: 120 U/L (ref 40–150)
Anion Gap: 9 mEq/L (ref 3–11)
BILIRUBIN TOTAL: 0.28 mg/dL (ref 0.20–1.20)
BUN: 17.8 mg/dL (ref 7.0–26.0)
CHLORIDE: 91 meq/L — AB (ref 98–109)
CO2: 23 meq/L (ref 22–29)
CREATININE: 0.8 mg/dL (ref 0.6–1.1)
Calcium: 9.4 mg/dL (ref 8.4–10.4)
EGFR: >60 mL/min/{1.73_m2} (ref >60)
GLUCOSE: 96 mg/dL (ref 70–140)
Potassium: 4.8 mEq/L (ref 3.5–5.1)
Sodium: 123 mEq/L — ABNORMAL LOW (ref 136–145)
TOTAL PROTEIN: 7 g/dL (ref 6.4–8.3)

## 2017-05-03 LAB — CBC WITH DIFFERENTIAL/PLATELET
BASO%: 1.1 % (ref 0.0–2.0)
BASOS ABS: 0.1 10*3/uL (ref 0.0–0.1)
EOS ABS: 0.2 10*3/uL (ref 0.0–0.5)
EOS%: 2 % (ref 0.0–7.0)
HCT: 32.7 % — ABNORMAL LOW (ref 34.8–46.6)
HEMOGLOBIN: 10.8 g/dL — AB (ref 11.6–15.9)
LYMPH%: 14.5 % (ref 14.0–49.7)
MCH: 28.2 pg (ref 25.1–34.0)
MCHC: 33.1 g/dL (ref 31.5–36.0)
MCV: 85.2 fL (ref 79.5–101.0)
MONO#: 0.8 10*3/uL (ref 0.1–0.9)
MONO%: 9.8 % (ref 0.0–14.0)
NEUT#: 6.2 10*3/uL (ref 1.5–6.5)
NEUT%: 72.6 % (ref 38.4–76.8)
Platelets: 336 10*3/uL (ref 145–400)
RBC: 3.84 10*6/uL (ref 3.70–5.45)
RDW: 13.8 % (ref 11.2–14.5)
WBC: 8.5 10*3/uL (ref 3.9–10.3)
lymph#: 1.2 10*3/uL (ref 0.9–3.3)

## 2017-05-03 MED ORDER — ALPRAZOLAM 0.25 MG PO TABS: 0.2500 mg | ORAL_TABLET | Freq: Two times a day (BID) | ORAL | 0 refills | Status: DC | PRN

## 2017-05-03 MED ORDER — PROCHLORPERAZINE MALEATE 10 MG PO TABS: 10.0000 mg | ORAL_TABLET | Freq: Four times a day (QID) | ORAL | 0 refills | Status: DC | PRN

## 2017-05-03 NOTE — Progress Notes (Signed)
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 Julien Nordmann 04/11/17, scheduled Pet and Ct head for 04/27/17, follow up 05/01/17  Signs/Symptoms Weight changes, if any: Wt Readings from Last 3 Encounters:  05/03/17 125 lb 4.8 oz (56.8 kg)  04/11/17 122 lb 3.2 oz (55.4 kg)  03/30/17 122 lb 12.7 oz (55.7 kg)   Weight increase 3.1 lbs.  14 lbs loss  Saw Ernestene Kiel 04/17/15  Respiratory complaints, if any: Denies SOB,coughing clear to golden secretion  Hemoptysis, if any: No  Pain issues, if any: No   SAFETY ISSUES:  Prior radiation? No  Pacemaker/ICD? No  Is the patient on methotrexate? No  Current Complaints / other details:  HX MI, CAD,Stroke 2000,Carotid artery occlusion,HTN,Anxiety Mother heart disease and stroke deceased,Fther deceased pneumonia,sister heart disease, Maternal Aunt breast cancer, Maternal grandfather prostate cancer,   Allergies: Dextroamphetamine Sulfate Er-Euphoric feeling,  BP (!) 155/67 (BP Location: Right Arm, Patient Position: Sitting, Cuff Size: Normal)   Pulse 69   Temp 98.2 F (36.8 C) (Oral)   Resp 20   Ht 5\' 1"  (1.549 m)   Wt 125 lb 4.8 oz (56.8 kg)   SpO2 98%   BMI 23.68 kg/m

## 2017-05-03 NOTE — Patient Instructions (Signed)
Carboplatin injection What is this medicine? CARBOPLATIN (KAR boe pla tin) is a chemotherapy drug. It targets fast dividing cells, like cancer cells, and causes these cells to die. This medicine is used to treat ovarian cancer and many other cancers. This medicine may be used for other purposes; ask your health care provider or pharmacist if you have questions. COMMON BRAND NAME(S): Paraplatin What should I tell my health care provider before I take this medicine? They need to know if you have any of these conditions: -blood disorders -hearing problems -kidney disease -recent or ongoing radiation therapy -an unusual or allergic reaction to carboplatin, cisplatin, other chemotherapy, other medicines, foods, dyes, or preservatives -pregnant or trying to get pregnant -breast-feeding How should I use this medicine? This drug is usually given as an infusion into a vein. It is administered in a hospital or clinic by a specially trained health care professional. Talk to your pediatrician regarding the use of this medicine in children. Special care may be needed. Overdosage: If you think you have taken too much of this medicine contact a poison control center or emergency room at once. NOTE: This medicine is only for you. Do not share this medicine with others. What if I miss a dose? It is important not to miss a dose. Call your doctor or health care professional if you are unable to keep an appointment. What may interact with this medicine? -medicines for seizures -medicines to increase blood counts like filgrastim, pegfilgrastim, sargramostim -some antibiotics like amikacin, gentamicin, neomycin, streptomycin, tobramycin -vaccines Talk to your doctor or health care professional before taking any of these medicines: -acetaminophen -aspirin -ibuprofen -ketoprofen -naproxen This list may not describe all possible interactions. Give your health care provider a list of all the medicines, herbs,  non-prescription drugs, or dietary supplements you use. Also tell them if you smoke, drink alcohol, or use illegal drugs. Some items may interact with your medicine. What should I watch for while using this medicine? Your condition will be monitored carefully while you are receiving this medicine. You will need important blood work done while you are taking this medicine. This drug may make you feel generally unwell. This is not uncommon, as chemotherapy can affect healthy cells as well as cancer cells. Report any side effects. Continue your course of treatment even though you feel ill unless your doctor tells you to stop. In some cases, you may be given additional medicines to help with side effects. Follow all directions for their use. Call your doctor or health care professional for advice if you get a fever, chills or sore throat, or other symptoms of a cold or flu. Do not treat yourself. This drug decreases your body's ability to fight infections. Try to avoid being around people who are sick. This medicine may increase your risk to bruise or bleed. Call your doctor or health care professional if you notice any unusual bleeding. Be careful brushing and flossing your teeth or using a toothpick because you may get an infection or bleed more easily. If you have any dental work done, tell your dentist you are receiving this medicine. Avoid taking products that contain aspirin, acetaminophen, ibuprofen, naproxen, or ketoprofen unless instructed by your doctor. These medicines may hide a fever. Do not become pregnant while taking this medicine. Women should inform their doctor if they wish to become pregnant or think they might be pregnant. There is a potential for serious side effects to an unborn child. Talk to your health care professional or  pharmacist for more information. Do not breast-feed an infant while taking this medicine. What side effects may I notice from receiving this medicine? Side effects  that you should report to your doctor or health care professional as soon as possible: -allergic reactions like skin rash, itching or hives, swelling of the face, lips, or tongue -signs of infection - fever or chills, cough, sore throat, pain or difficulty passing urine -signs of decreased platelets or bleeding - bruising, pinpoint red spots on the skin, black, tarry stools, nosebleeds -signs of decreased red blood cells - unusually weak or tired, fainting spells, lightheadedness -breathing problems -changes in hearing -changes in vision -chest pain -high blood pressure -low blood counts - This drug may decrease the number of white blood cells, red blood cells and platelets. You may be at increased risk for infections and bleeding. -nausea and vomiting -pain, swelling, redness or irritation at the injection site -pain, tingling, numbness in the hands or feet -problems with balance, talking, walking -trouble passing urine or change in the amount of urine Side effects that usually do not require medical attention (report to your doctor or health care professional if they continue or are bothersome): -hair loss -loss of appetite -metallic taste in the mouth or changes in taste This list may not describe all possible side effects. Call your doctor for medical advice about side effects. You may report side effects to FDA at 1-800-FDA-1088. Where should I keep my medicine? This drug is given in a hospital or clinic and will not be stored at home. NOTE: This sheet is a summary. It may not cover all possible information. If you have questions about this medicine, talk to your doctor, pharmacist, or health care provider.  2018 Elsevier/Gold Standard (2007-08-13 14:38:05)  Paclitaxel injection What is this medicine? PACLITAXEL (PAK li TAX el) is a chemotherapy drug. It targets fast dividing cells, like cancer cells, and causes these cells to die. This medicine is used to treat ovarian cancer,  breast cancer, and other cancers. This medicine may be used for other purposes; ask your health care provider or pharmacist if you have questions. COMMON BRAND NAME(S): Onxol, Taxol What should I tell my health care provider before I take this medicine? They need to know if you have any of these conditions: -blood disorders -irregular heartbeat -infection (especially a virus infection such as chickenpox, cold sores, or herpes) -liver disease -previous or ongoing radiation therapy -an unusual or allergic reaction to paclitaxel, alcohol, polyoxyethylated castor oil, other chemotherapy agents, other medicines, foods, dyes, or preservatives -pregnant or trying to get pregnant -breast-feeding How should I use this medicine? This drug is given as an infusion into a vein. It is administered in a hospital or clinic by a specially trained health care professional. Talk to your pediatrician regarding the use of this medicine in children. Special care may be needed. Overdosage: If you think you have taken too much of this medicine contact a poison control center or emergency room at once. NOTE: This medicine is only for you. Do not share this medicine with others. What if I miss a dose? It is important not to miss your dose. Call your doctor or health care professional if you are unable to keep an appointment. What may interact with this medicine? Do not take this medicine with any of the following medications: -disulfiram -metronidazole This medicine may also interact with the following medications: -cyclosporine -diazepam -ketoconazole -medicines to increase blood counts like filgrastim, pegfilgrastim, sargramostim -other chemotherapy drugs like  cisplatin, doxorubicin, epirubicin, etoposide, teniposide, vincristine -quinidine -testosterone -vaccines -verapamil Talk to your doctor or health care professional before taking any of these  medicines: -acetaminophen -aspirin -ibuprofen -ketoprofen -naproxen This list may not describe all possible interactions. Give your health care provider a list of all the medicines, herbs, non-prescription drugs, or dietary supplements you use. Also tell them if you smoke, drink alcohol, or use illegal drugs. Some items may interact with your medicine. What should I watch for while using this medicine? Your condition will be monitored carefully while you are receiving this medicine. You will need important blood work done while you are taking this medicine. This medicine can cause serious allergic reactions. To reduce your risk you will need to take other medicine(s) before treatment with this medicine. If you experience allergic reactions like skin rash, itching or hives, swelling of the face, lips, or tongue, tell your doctor or health care professional right away. In some cases, you may be given additional medicines to help with side effects. Follow all directions for their use. This drug may make you feel generally unwell. This is not uncommon, as chemotherapy can affect healthy cells as well as cancer cells. Report any side effects. Continue your course of treatment even though you feel ill unless your doctor tells you to stop. Call your doctor or health care professional for advice if you get a fever, chills or sore throat, or other symptoms of a cold or flu. Do not treat yourself. This drug decreases your body's ability to fight infections. Try to avoid being around people who are sick. This medicine may increase your risk to bruise or bleed. Call your doctor or health care professional if you notice any unusual bleeding. Be careful brushing and flossing your teeth or using a toothpick because you may get an infection or bleed more easily. If you have any dental work done, tell your dentist you are receiving this medicine. Avoid taking products that contain aspirin, acetaminophen, ibuprofen,  naproxen, or ketoprofen unless instructed by your doctor. These medicines may hide a fever. Do not become pregnant while taking this medicine. Women should inform their doctor if they wish to become pregnant or think they might be pregnant. There is a potential for serious side effects to an unborn child. Talk to your health care professional or pharmacist for more information. Do not breast-feed an infant while taking this medicine. Men are advised not to father a child while receiving this medicine. This product may contain alcohol. Ask your pharmacist or healthcare provider if this medicine contains alcohol. Be sure to tell all healthcare providers you are taking this medicine. Certain medicines, like metronidazole and disulfiram, can cause an unpleasant reaction when taken with alcohol. The reaction includes flushing, headache, nausea, vomiting, sweating, and increased thirst. The reaction can last from 30 minutes to several hours. What side effects may I notice from receiving this medicine? Side effects that you should report to your doctor or health care professional as soon as possible: -allergic reactions like skin rash, itching or hives, swelling of the face, lips, or tongue -low blood counts - This drug may decrease the number of white blood cells, red blood cells and platelets. You may be at increased risk for infections and bleeding. -signs of infection - fever or chills, cough, sore throat, pain or difficulty passing urine -signs of decreased platelets or bleeding - bruising, pinpoint red spots on the skin, black, tarry stools, nosebleeds -signs of decreased red blood cells - unusually weak  or tired, fainting spells, lightheadedness -breathing problems -chest pain -high or low blood pressure -mouth sores -nausea and vomiting -pain, swelling, redness or irritation at the injection site -pain, tingling, numbness in the hands or feet -slow or irregular heartbeat -swelling of the ankle,  feet, hands Side effects that usually do not require medical attention (report to your doctor or health care professional if they continue or are bothersome): -bone pain -complete hair loss including hair on your head, underarms, pubic hair, eyebrows, and eyelashes -changes in the color of fingernails -diarrhea -loosening of the fingernails -loss of appetite -muscle or joint pain -red flush to skin -sweating This list may not describe all possible side effects. Call your doctor for medical advice about side effects. You may report side effects to FDA at 1-800-FDA-1088. Where should I keep my medicine? This drug is given in a hospital or clinic and will not be stored at home. NOTE: This sheet is a summary. It may not cover all possible information. If you have questions about this medicine, talk to your doctor, pharmacist, or health care provider.  2018 Elsevier/Gold Standard (2015-03-09 19:58:00)

## 2017-05-03 NOTE — Telephone Encounter (Signed)
Scheduled appt per 12/13 los - Gave patient AVS and calender per los.

## 2017-05-03 NOTE — Progress Notes (Signed)
[facility] CSW Progress Note  CSW received request from RN to assess for possible transportation needs for patient.  Met w patient and daughter in exam room.  Patient lives w daughter in daughter's home.  Daughter wants to provide transport for mother to appointments, neither patient nor daughter want help w transportation.  Per daughter, as long as appointments are in early mornings, she can bring patient.  CSW discussed transportation options for individuals living outside [place] as well as [employer] [facility] and [facility].  Patient declined all options.  CSW reviewed support resources offered by [employer], provided literature and contact information.  Edwyna Shell, LCSW Clinical Social Worker Phone:  [phone]

## 2017-05-03 NOTE — Progress Notes (Signed)
[place] OFFICE PROGRESS NOTE  Leonard Downing, MD [place] [address]  DIAGNOSIS: stage IIIa (T2b, N2, M0) non-small cell lung cancer, squamous cell carcinoma presented with left upper lobe lung mass left hilar and mediastinal lymphadenopathy diagnosed in November 2018. PDL 1 less than 1%.  PRIOR THERAPY: None  CURRENT THERAPY: Concurrent chemoradiation with weekly carboplatin for an AUC of 2 with paclitaxel 45 mg/m.  First dose expected on 05/21/2017.  INTERVAL HISTORY: ELLEANOR GUYETT 83 y.o. female returns for routine follow-up visit accompanied by her 52.  The patient reports feeling fatigue and weakness.  She denies fevers and chills.  Denies chest pain, shortness of breath, hemoptysis.  She does have a nonproductive cough.  Denies nausea, vomiting, constipation, diarrhea.  She denies weight loss and night sweats.  The patient is here for evaluation and discussion of her recent CT of the head and PET scan.  MEDICAL HISTORY: Past Medical History:  Diagnosis Date  . Abdominal aortic atherosclerosis ([place])   . CAD (coronary artery disease)   . Carotid artery occlusion   . Common bile duct dilation   . Gallstones   . Headache(784.0)   . Hiatal hernia   . HLD (hyperlipidemia)   . Myocardial infarction ([place])   . PONV (postoperative nausea and vomiting)   . Stroke [facility]) 2003    ALLERGIES:  is allergic to dextroamphetamine sulfate er and other.  MEDICATIONS:  Current Outpatient Medications  Medication Sig Dispense Refill  . ALPRAZolam (XANAX) 0.25 MG tablet Take 1 tablet (0.25 mg total) by mouth 2 (two) times daily as needed for anxiety. 30 tablet 0  . aspirin 81 MG EC tablet Take 81 mg by mouth daily.      . Calcium Carbonate-Vitamin D (CALCIUM + D PO) Take 1 tablet by mouth daily.    . feeding supplement, ENSURE ENLIVE, (ENSURE ENLIVE) LIQD Take 237 mLs 2 (two) times daily between meals by mouth. 30 Bottle 0  .  Glucosamine-Chondroitin (GLUCOSAMINE CHONDR COMPLEX PO) Take 1 capsule by mouth daily.    Marland Kitchen guaiFENesin (MUCINEX) 600 MG 12 hr tablet Take 2 tablets (1,200 mg total) 2 (two) times daily by mouth. 30 tablet 0  . lisinopril (PRINIVIL,ZESTRIL) 20 MG tablet Take 1 tablet (20 mg total) daily by mouth. 30 tablet 0  . Multiple Vitamin (MULTIVITAMIN WITH MINERALS) TABS tablet Take 1 tablet by mouth at bedtime.    . nitroGLYCERIN (NITROSTAT) 0.4 MG SL tablet Place 0.4 mg under the tongue every 5 (five) minutes as needed for chest pain.    . polyethylene glycol (MIRALAX / GLYCOLAX) packet Take 17 g daily as needed by mouth. 14 each 0  . prochlorperazine (COMPAZINE) 10 MG tablet Take 1 tablet (10 mg total) by mouth every 6 (six) hours as needed for nausea or vomiting. (Patient not taking: Reported on 05/03/2017) 30 tablet 0   No current facility-administered medications for this visit.     SURGICAL HISTORY:  Past Surgical History:  Procedure Laterality Date  . ABDOMINAL HYSTERECTOMY    . ANKLE FRACTURE SURGERY    . APPENDECTOMY    . BLADDER SURGERY    . CHOLECYSTECTOMY N/A 05/05/2013   Procedure: LAPAROSCOPIC CHOLECYSTECTOMY WITH INTRAOPERATIVE CHOLANGIOGRAM;  Surgeon: Ralene Ok, MD;  Location: [facility];  Service: General;  Laterality: N/A;  . HIP ARTHROPLASTY Right 01/10/2014   Procedure: RIGHT HIP HEMIARTHROPLASTY;  Surgeon: Johnn Hai, MD;  Location: [facility];  Service: Orthopedics;  Laterality: Right;  DEPUY AML LATERAL WITH MARK II  . ROTATOR CUFF REPAIR Left   . TONSILLECTOMY      REVIEW OF SYSTEMS:   Review of Systems  Constitutional: Negative for appetite change, chills, fever and unexpected weight change. Positive for fatigue and weakness. HENT:   Negative for mouth sores, nosebleeds, sore throat and trouble swallowing.   Eyes: Negative for eye problems and icterus.  Respiratory: Negative for cough, hemoptysis, shortness of breath and wheezing.   Cardiovascular: Negative for chest  pain and leg swelling.  Gastrointestinal: Negative for abdominal pain, constipation, diarrhea, nausea and vomiting.  Genitourinary: Negative for bladder incontinence, difficulty urinating, dysuria, frequency and hematuria.   Musculoskeletal: Negative for back pain, neck pain and neck stiffness.  Skin: Negative for itching and rash.  Neurological: Negative for dizziness, extremity weakness, gait problem, headaches, light-headedness and seizures.  Hematological: Negative for adenopathy. Does not bruise/bleed easily.  Psychiatric/Behavioral: Negative for confusion, depression and sleep disturbance. The patient is not nervous/anxious.     PHYSICAL EXAMINATION:  Blood pressure (!) 155/67, pulse 69, temperature 98.2 F (36.8 C), temperature source Oral, resp. rate 20, weight 125 lb 4.8 oz (56.8 kg), SpO2 96 %.  ECOG PERFORMANCE STATUS: 1 - Symptomatic but completely ambulatory  Physical Exam  Constitutional: Oriented to person, place, and time and well-developed, well-nourished, and in no distress. No distress.  HENT:  Head: Normocephalic and atraumatic.  Mouth/Throat: Oropharynx is clear and moist. No oropharyngeal exudate.  Eyes: Conjunctivae are normal. Right eye exhibits no discharge. Left eye exhibits no discharge. No scleral icterus.  Neck: Normal range of motion. Neck supple.  Cardiovascular: Normal rate, regular rhythm, normal heart sounds and intact distal pulses.   Pulmonary/Chest: Effort normal and breath sounds normal. No respiratory distress. No wheezes. No rales.  Abdominal: Soft. Bowel sounds are normal. Exhibits no distension and no mass. There is no tenderness.  Musculoskeletal: Normal range of motion. Exhibits no edema.  Lymphadenopathy:    No cervical adenopathy.  Neurological: Alert and oriented to person, place, and time. Exhibits normal muscle tone. Coordination normal.  Skin: Skin is warm and dry. No rash noted. Not diaphoretic. No erythema. No pallor.  Psychiatric:  Mood, memory and judgment normal.  Vitals reviewed.  LABORATORY DATA: Lab Results  Component Value Date   WBC 8.5 05/03/2017   HGB 10.8 (L) 05/03/2017   HCT 32.7 (L) 05/03/2017   MCV 85.2 05/03/2017   PLT 336 05/03/2017      Chemistry      Component Value Date/Time   NA 123 (L) 05/03/2017 1447   K 4.8 05/03/2017 1447   CL 99 (L) 04/04/2017 0539   CO2 23 05/03/2017 1447   BUN 17.8 05/03/2017 1447   CREATININE 0.8 05/03/2017 1447      Component Value Date/Time   CALCIUM 9.4 05/03/2017 1447   ALKPHOS 120 05/03/2017 1447   AST 11 05/03/2017 1447   ALT 9 05/03/2017 1447   BILITOT 0.28 05/03/2017 1447       RADIOGRAPHIC STUDIES:  Ct Head W Wo Contrast  Result Date: 04/27/2017 CLINICAL DATA:  Non small cell lung cancer. Headaches. Left upper lobe mass. EXAM: CT HEAD WITHOUT AND WITH CONTRAST TECHNIQUE: Contiguous axial images were obtained from the base of the skull through the vertex without and with intravenous contrast CONTRAST:  83m ISOVUE-300 IOPAMIDOL (ISOVUE-300) INJECTION 61% COMPARISON:  CT head without contrast 10/14/2003. PET scan of the same day. FINDINGS: Brain: Advanced atrophy and white matter disease is present bilaterally. The postcontrast images  demonstrate no pathologic enhancement. Basal ganglia are intact. No focal cortical lesions are present. The ventricles are proportionate to the degree of atrophy. The brainstem and cerebellum are within normal limits. The postcontrast images demonstrate no pathologic enhancement. Vascular: Atherosclerotic calcifications are present within the cavernous internal carotid arteries bilaterally. Skull: Calvarium is intact. No focal lytic or blastic lesions are present. Sinuses/Orbits: A polyp or mucous retention cyst is present within the right ethmoid air cells. The paranasal sinuses and mastoid air cells are otherwise clear. Bilateral lens replacements are present. Globes and orbits are otherwise within normal limits. IMPRESSION:  1. No evidence for metastatic disease the brain. 2. Advanced atrophy and white matter disease likely reflects the sequela of chronic microvascular ischemia. 3. Atherosclerosis. Electronically Signed   By: San Morelle M.D.   On: 04/27/2017 16:04   Nm Pet Image Initial (pi) Skull Base To Thigh  Result Date: 04/27/2017 CLINICAL DATA:  Initial treatment strategy for non-small cell lung cancer in the left upper lobe EXAM: NUCLEAR MEDICINE PET SKULL BASE TO THIGH TECHNIQUE: 6.6 mCi F-18 FDG was injected intravenously. Full-ring PET imaging was performed from the skull base to thigh after the radiotracer. CT data was obtained and used for attenuation correction and anatomic localization. FASTING BLOOD GLUCOSE:  Value: 110 mg/dl COMPARISON:  Multiple exams, including 03/30/2017 CT chest FINDINGS: NECK No hypermetabolic lymph nodes in the neck. CHEST 7.5 by 6.0 cm left apical Pancoast tumor with irregular margins, maximum SUV 24.3 possible mediastinal involvement abutting an up to 180 degree arc of the left subclavian artery. The mass also abuts the margin of the aortic arch over an 83 degree marginal arc and 2.7 cm of longitudinal contact. Questionable involvement of the left paraspinal soft tissues as on the original CT, but without vertebral or definite rib invasion at this time. An AP window lymph node measures 1.1 cm in short axis on image 60/4 and has a maximum SUV of 4.2. Left suprahilar adenopathy measured 1.2 cm on the 03/30/2017 CT scan, and has low-level metabolic activity with maximum SUV 4.2. Background mediastinal blood pool activity 2.2. ABDOMEN/PELVIS Low-density fullness of the left adrenal gland, possibly from a small adrenal adenoma, 7 Hounsfield units, without significant abnormal hypermetabolic activity. The liver, spleen, and pancreas demonstrate no abnormal activity. No hypermetabolic adenopathy in the abdomen or pelvis. Aortoiliac atherosclerotic vascular disease. Sigmoid colon  diverticulosis. SKELETON No focal hypermetabolic activity to suggest skeletal metastasis. Right hip hemiarthroplasty. The mild sclerosis along both sacroiliac joints. Degenerative facet arthropathy in the lower lumbar spine. IMPRESSION: 1. 7.5 cm left apical Pancoast tumor contacts the margins of the left subclavian artery and the aortic arch, and is highly hypermetabolic with maximum SUV 24.3. There may be some early in extension into the left paraspinal adipose tissue but no vertebral/bony invasion. 2. Mildly enlarged left suprahilar and AP window lymph nodes each have a maximum SUV of 4.2, which is enough above background mediastinal blood pool activity that these nodes are probably metastatic. 3. No findings of metastatic disease to the neck, abdomen/pelvis, or skeleton. 4. Other imaging findings of potential clinical significance: Possible small left adrenal adenoma. Aortic Atherosclerosis (ICD10-I70.0). Sigmoid colon diverticulosis. Mild chronic bilateral sacroiliitis. Facet arthropathy in the lower lumbar spine. Right hip hemiarthroplasty. Electronically Signed   By: Van Clines M.D.   On: 04/27/2017 16:00   Ct Biopsy  Result Date: 04/04/2017 INDICATION: Left upper lobe mass EXAM: CT-GUIDED BIOPSY LARGE LEFT UPPER LOBE MASS MEDICATIONS: 1% LIDOCAINE LOCAL ANESTHESIA/SEDATION: 1.0 mg IV Versed;  25 mcg IV Fentanyl Moderate Sedation Time:  15 MINUTES The patient was continuously monitored during the procedure by the interventional radiology nurse under my direct supervision. PROCEDURE: The procedure, risks, benefits, and alternatives were explained to the patient. Questions regarding the procedure were encouraged and answered. The patient understands and consents to the procedure. previous imaging reviewed. patient position left side down decubitus. noncontrast localization ct performed. the large left upper lobe posterior apical mass was localized. Overlying skin marked. Under sterile conditions and  local anesthesia, a 17 gauge coaxial guide needle was advanced from a posterior intercostal approach into the left upper lobe mass medially. Needle position confirmed with CT. 3 18 gauge core biopsies obtained. Samples placed in formalin. Needle tract occlusion performed with the biosentry device. Postprocedure imaging demonstrates no hemorrhage or hematoma. No pneumothorax. Patient tolerated the procedure well without complication. Vital sign monitoring by nursing staff during the procedure will continue as patient is in the special procedures unit for post procedure observation. FINDINGS: The images document guide needle placement within the large left upper lobe mass. Post biopsy images demonstrate no evidence of pneumothorax or effusion. COMPLICATIONS: None immediate. IMPRESSION: Successful CT-guided core biopsy of the left upper lobe mass Electronically Signed   By: Jerilynn Mages.  Shick M.D.   On: 04/04/2017 15:45   Dg Chest Port 1 View  Result Date: 04/04/2017 CLINICAL DATA:  Upper lobe lung mass. EXAM: PORTABLE CHEST 1 VIEW COMPARISON:  CT chest 03/30/2017. FINDINGS: Normal cardiomediastinal silhouette. LEFT upper lobe mass, similar to priors. No significant post biopsy enlargement or hemothorax. No pneumothorax. Lung fields otherwise clear. IMPRESSION: Satisfactory post biopsy appearance. Electronically Signed   By: Staci Righter M.D.   On: 04/04/2017 19:09     ASSESSMENT/PLAN:  Stage III squamous cell carcinoma of left lung [facility]) This is a very pleasant 83 year old white female recently diagnosed with at least stage IIIa (T2b, N2, M0) non-small cell lung cancer, squamous cell carcinoma presented with left upper lobe lung mass left hilar and mediastinal lymphadenopathy diagnosed in November 2018.  PDL 1 is less than 1%.  The patient was seen with Dr. Julien Nordmann.  CT scan of the head did not show any evidence of metastatic disease.  PET scan results were discussed with the patient and we have explained that  she does not have any evidence of metastatic disease we had a discussion with patient and her stepdaughter today about her current disease is stage, prognosis and treatment options. Recommend a course of concurrent chemoradiation with weekly carboplatin for AUC of 2 with paclitaxel 45 mg/M2.  Anticipate first dose on 05/21/2017.  A prescription for Compazine 10 mg every 6 hours as needed was sent to her pharmacy.  The patient will be set up for a chemotherapy education class.  She has not yet met with radiation oncology and we have arranged for her to see her after our visit today.  The patient will have a follow-up visit for evaluation prior to cycle 2 of her chemotherapy.  For anxiety, I have refilled a prescription for Xanax 0.25 mg p.o. twice a day as needed.  She was advised to minimize her use of this medication as it is habit-forming.  The patient was advised to call immediately if she has any concerning symptoms in the interval. The patient voices understanding of current disease status and treatment options and is in agreement with the current care plan.  All questions were answered. The patient knows to call the clinic with any problems,  questions or concerns. We can certainly see the patient much sooner if necessary.  Orders Placed This Encounter  Procedures  . Comprehensive metabolic panel    Standing Status:   Standing    Number of Occurrences:   20    Standing Expiration Date:   05/03/2018  . CBC with Differential/Platelet    Standing Status:   Standing    Number of Occurrences:   20    Standing Expiration Date:   05/03/2018    Mikey Bussing, DNP, AGPCNP-BC, AOCNP 05/04/17  ADDENDUM: Hematology/Oncology Attending: I had a face-to-face encounter with the patient.  I recommended her care plan.  This is a very pleasant 83 years old white female recently diagnosed with a stage IIIa non-small cell lung cancer, Squamous cell carcinoma presented with left upper lobe lung  mass in addition to left hilar and mediastinal lymphadenopathy diagnosed in November 2018.  The patient had a PET scan as well as CT scan of the head performed recently that showed no evidence of metastatic disease to the brain or any other metastatic disease outside the lung. I had a lengthy discussion with the patient and her daughter today about her current disease status and treatment options.  I recommended for the ablation treatment was a course of concurrent chemoradiation with weekly carboplatin for AUC of 2 and paclitaxel 45 mg/M2 I discussed with the patient the adverse effect of this treatment including but not limited to alopecia, myelosuppression, nausea and vomiting, peripheral neuropathy, liver or renal dysfunction. The patient is expected to start the first cycle of this treatment after Christmas. She will see Dr. Lisbeth Renshaw from radiation oncology later today for evaluation and discussion of the radiotherapy option. We will also arrange for the patient to have a chemotherapy education class before the first dose of her treatment. The patient was advised to call immediately if she has any concerning symptoms in the interval.  Disclaimer: This note was dictated with voice recognition software. Similar sounding words can inadvertently be transcribed and may be missed upon review. Eilleen Kempf, MD 05/05/17

## 2017-05-03 NOTE — Progress Notes (Signed)
START ON PATHWAY REGIMEN - Non-Small Cell Lung     Administer weekly:     Paclitaxel      Carboplatin   **Always confirm dose/schedule in your pharmacy ordering system**    Patient Characteristics: Stage III - Unresectable, PS = 0, 1 AJCC T Category: T4 Current Disease Status: No Distant Mets or Local Recurrence AJCC N Category: N2 AJCC M Category: M0 AJCC 8 Stage Grouping: IIIB Performance Status: PS = 0, 1 Intent of Therapy: Curative Intent, Discussed with Patient

## 2017-05-04 ENCOUNTER — Encounter: Payer: Self-pay | Admitting: Oncology

## 2017-05-04 ENCOUNTER — Other Ambulatory Visit: Payer: Medicare Other

## 2017-05-04 ENCOUNTER — Ambulatory Visit
Admission: RE | Admit: 2017-05-04 | Discharge: 2017-05-04 | Disposition: A | Discharge status: Home / Self Care | Payer: Medicare Other | Source: Ambulatory Visit | Attending: Radiation Oncology | Admitting: Radiation Oncology

## 2017-05-04 DIAGNOSIS — C3412 Malignant neoplasm of upper lobe, left bronchus or lung: Secondary | ICD-10-CM

## 2017-05-04 DIAGNOSIS — Z7189 Other specified counseling: Secondary | ICD-10-CM | POA: Insufficient documentation

## 2017-05-04 DIAGNOSIS — Z5111 Encounter for antineoplastic chemotherapy: Secondary | ICD-10-CM | POA: Insufficient documentation

## 2017-05-04 NOTE — Progress Notes (Signed)
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   HISTORY OF PRESENT ILLNESS: [name] is a 83 y.o. female seen at the request of Dr. Julien Nordmann with a newly diagnosed Lung cancer with pancoast features. She  presented to ED reporting of 3 week hx of weakness, fatigue, suppressed appetite, and weight loss on 03/30/17. Chest CT at that time revealed a spiculated irregular solid 6.4 cm posterior apical left upper lobe lung mass highly suspicious for primary bronchogenic carcinoma with suggestion of early invasion of the left paraspinal soft tissue at the T3-4 level without appreciable osseous invasion. There was also ipsilateral hilar and ipsilateral mediastinal lymphadenopathy suspicious for nodal metastatic disease. She underwent CT guided biopsy of the primary mass on 04/04/17 which revealed squamous cell carcinoma of the lung.  PET scan on 04/27/17 showed 7.5 cm left apical Pancoast tumor in contact with margin of left subclavian artery and the aortic arch, and is highly hypermetabolic with maximum SUV 24.3. There may be some early in extension into the left paraspinal adipose tissue but no vertebral/bony invasion. Mildly enlarged left suprahilar and AP window lymph nodes each have a maximum SUV of 4.2, which is enough above background mediastinal blood pool activity that these nodes are probably metastatic. There was no evidence of metastatic disease to the neck, abdomen/pelvis, or skeleton. Head CT on 04/27/17 was negative for brain mets, and she has refused a brain MRI.  She comes to medical oncology clinic to be seen to review plans for chemoradiation with weekly  carboplatin for AUC of 2 1 paclitaxel 45 mg/M2. She no showed several appointments this week but we've been asked to see her today to discuss radiation therapy in the management of her disease.   PREVIOUS RADIATION THERAPY: No   PAST MEDICAL HISTORY:  Past Medical History:  Diagnosis Date  . Abdominal aortic atherosclerosis ([place])   . CAD (coronary artery disease)   . Carotid artery occlusion   . Common bile duct dilation   . Gallstones   . Headache(784.0)   . Hiatal hernia   . HLD (hyperlipidemia)   . Myocardial infarction ([place])   . PONV (postoperative nausea and vomiting)   . Stroke [facility]) 2003       PAST SURGICAL HISTORY: Past Surgical History:  Procedure Laterality Date  . ABDOMINAL HYSTERECTOMY    . ANKLE FRACTURE SURGERY    . APPENDECTOMY    . BLADDER SURGERY    . CHOLECYSTECTOMY N/A 05/05/2013   Procedure: LAPAROSCOPIC CHOLECYSTECTOMY WITH INTRAOPERATIVE CHOLANGIOGRAM;  Surgeon: Ralene Ok, MD;  Location: [facility];  Service: General;  Laterality: N/A;  . HIP ARTHROPLASTY Right 01/10/2014   Procedure: RIGHT HIP HEMIARTHROPLASTY;  Surgeon: Johnn Hai, MD;  Location: [facility];  Service: Orthopedics;  Laterality: Right;  DEPUY AML LATERAL WITH MARK II  . ROTATOR CUFF REPAIR Left   . TONSILLECTOMY       FAMILY HISTORY:  Family History  Problem Relation Age of Onset  . Stroke Mother   . Breast cancer Maternal Aunt   . Prostate cancer Maternal Grandfather      SOCIAL HISTORY:  reports that she quit smoking  about 5 weeks ago. Her smoking use included cigarettes. she has never used smokeless tobacco. She reports that she does not drink alcohol or use drugs.   ALLERGIES: Dextroamphetamine sulfate er and Other   MEDICATIONS:  Current Outpatient Medications  Medication Sig Dispense Refill  . ALPRAZolam (XANAX) 0.25 MG tablet Take 1 tablet (0.25 mg total) by mouth 2 (two) times daily as needed for anxiety. 30 tablet 0  . aspirin 81 MG EC tablet Take 81 mg by  mouth daily.      . Calcium Carbonate-Vitamin D (CALCIUM + D PO) Take 1 tablet by mouth daily.    . feeding supplement, ENSURE ENLIVE, (ENSURE ENLIVE) LIQD Take 237 mLs 2 (two) times daily between meals by mouth. 30 Bottle 0  . Glucosamine-Chondroitin (GLUCOSAMINE CHONDR COMPLEX PO) Take 1 capsule by mouth daily.    Marland Kitchen guaiFENesin (MUCINEX) 600 MG 12 hr tablet Take 2 tablets (1,200 mg total) 2 (two) times daily by mouth. 30 tablet 0  . lisinopril (PRINIVIL,ZESTRIL) 20 MG tablet Take 1 tablet (20 mg total) daily by mouth. 30 tablet 0  . Multiple Vitamin (MULTIVITAMIN WITH MINERALS) TABS tablet Take 1 tablet by mouth at bedtime.    . nitroGLYCERIN (NITROSTAT) 0.4 MG SL tablet Place 0.4 mg under the tongue every 5 (five) minutes as needed for chest pain.    . polyethylene glycol (MIRALAX / GLYCOLAX) packet Take 17 g daily as needed by mouth. 14 each 0  . prochlorperazine (COMPAZINE) 10 MG tablet Take 1 tablet (10 mg total) by mouth every 6 (six) hours as needed for nausea or vomiting. (Patient not taking: Reported on 05/03/2017) 30 tablet 0   No current facility-administered medications for this encounter.      REVIEW OF SYSTEMS: On review of systems, the patient reports that she is doing well overall. She is tired and continues to have a poor appetite. She reports shortness of breath with walking or mild exertion. She denies any chest pain,cough, fevers, chills, night sweats, unintended weight changes. She denies any bowel or bladder disturbances, and denies abdominal pain, nausea or vomiting. She denies any new musculoskeletal or joint aches or pains. A complete review of systems is obtained and is otherwise negative.     PHYSICAL EXAM:  Wt Readings from Last 3 Encounters:  05/03/17 125 lb 4.8 oz (56.8 kg)  04/11/17 122 lb 3.2 oz (55.4 kg)  03/30/17 122 lb 12.7 oz (55.7 kg)   Temp Readings from Last 3 Encounters:  05/03/17 98.2 F (36.8 C) (Oral)  04/11/17 98.5 F (36.9 C) (Oral)    04/04/17 98.7 F (37.1 C) (Oral)   BP Readings from Last 3 Encounters:  05/03/17 (!) 155/67  04/11/17 (!) 110/47  04/04/17 (!) 146/57   Pulse Readings from Last 3 Encounters:  05/03/17 69  04/11/17 67  04/04/17 89     In general this is a chronically ill, elderly caucasian female in no acute distress. She is alert and oriented x4 and appropriate throughout the examination. HEENT reveals that the patient is normocephalic, atraumatic. EOMs are intact. PERRLA. Skin is intact without any evidence of gross lesions. Cardiopulmonary assessment is negative for acute distress and she exhibits normal effort.    ECOG = 2  0 - Asymptomatic (Fully active, able to carry on all predisease activities without restriction)  1 - Symptomatic but completely ambulatory (Restricted in physically strenuous activity but ambulatory and able to carry out work of a light or sedentary nature. For example, light housework, office  work)  2 - Symptomatic, <50% in bed during the day (Ambulatory and capable of all self care but unable to carry out any work activities. Up and about more than 50% of waking hours)  3 - Symptomatic, >50% in bed, but not bedbound (Capable of only limited self-care, confined to bed or chair 50% or more of waking hours)  4 - Bedbound (Completely disabled. Cannot carry on any self-care. Totally confined to bed or chair)  5 - Death   Eustace Pen MM, Creech RH, Tormey DC, et al. [phone]). "Toxicity and response criteria of the [facility]". [place] Oncol. 5 (6): 649-55    LABORATORY DATA:  Lab Results  Component Value Date   WBC 8.5 05/03/2017   HGB 10.8 (L) 05/03/2017   HCT 32.7 (L) 05/03/2017   MCV 85.2 05/03/2017   PLT 336 05/03/2017   Lab Results  Component Value Date   NA 123 (L) 05/03/2017   K 4.8 05/03/2017   CL 99 (L) 04/04/2017   CO2 23 05/03/2017   Lab Results  Component Value Date   ALT 9 05/03/2017   AST 11 05/03/2017   ALKPHOS 120  05/03/2017   BILITOT 0.28 05/03/2017      RADIOGRAPHY: Ct Head W Wo Contrast  Result Date: 04/27/2017 CLINICAL DATA:  Non small cell lung cancer. Headaches. Left upper lobe mass. EXAM: CT HEAD WITHOUT AND WITH CONTRAST TECHNIQUE: Contiguous axial images were obtained from the base of the skull through the vertex without and with intravenous contrast CONTRAST:  38mL ISOVUE-300 IOPAMIDOL (ISOVUE-300) INJECTION 61% COMPARISON:  CT head without contrast 10/14/2003. PET scan of the same day. FINDINGS: Brain: Advanced atrophy and white matter disease is present bilaterally. The postcontrast images demonstrate no pathologic enhancement. Basal ganglia are intact. No focal cortical lesions are present. The ventricles are proportionate to the degree of atrophy. The brainstem and cerebellum are within normal limits. The postcontrast images demonstrate no pathologic enhancement. Vascular: Atherosclerotic calcifications are present within the cavernous internal carotid arteries bilaterally. Skull: Calvarium is intact. No focal lytic or blastic lesions are present. Sinuses/Orbits: A polyp or mucous retention cyst is present within the right ethmoid air cells. The paranasal sinuses and mastoid air cells are otherwise clear. Bilateral lens replacements are present. Globes and orbits are otherwise within normal limits. IMPRESSION: 1. No evidence for metastatic disease the brain. 2. Advanced atrophy and white matter disease likely reflects the sequela of chronic microvascular ischemia. 3. Atherosclerosis. Electronically Signed   By: San Morelle M.D.   On: 04/27/2017 16:04   Nm Pet Image Initial (pi) Skull Base To Thigh  Result Date: 04/27/2017 CLINICAL DATA:  Initial treatment strategy for non-small cell lung cancer in the left upper lobe EXAM: NUCLEAR MEDICINE PET SKULL BASE TO THIGH TECHNIQUE: 6.6 mCi F-18 FDG was injected intravenously. Full-ring PET imaging was performed from the skull base to thigh after the  radiotracer. CT data was obtained and used for attenuation correction and anatomic localization. FASTING BLOOD GLUCOSE:  Value: 110 mg/dl COMPARISON:  Multiple exams, including 03/30/2017 CT chest FINDINGS: NECK No hypermetabolic lymph nodes in the neck. CHEST 7.5 by 6.0 cm left apical Pancoast tumor with irregular margins, maximum SUV 24.3 possible mediastinal involvement abutting an up to 180 degree arc of the left subclavian artery. The mass also abuts the margin of the aortic arch over an 83 degree marginal arc and 2.7 cm of longitudinal contact. Questionable involvement of the left paraspinal soft tissues as on the original  CT, but without vertebral or definite rib invasion at this time. An AP window lymph node measures 1.1 cm in short axis on image 60/4 and has a maximum SUV of 4.2. Left suprahilar adenopathy measured 1.2 cm on the 03/30/2017 CT scan, and has low-level metabolic activity with maximum SUV 4.2. Background mediastinal blood pool activity 2.2. ABDOMEN/PELVIS Low-density fullness of the left adrenal gland, possibly from a small adrenal adenoma, 7 Hounsfield units, without significant abnormal hypermetabolic activity. The liver, spleen, and pancreas demonstrate no abnormal activity. No hypermetabolic adenopathy in the abdomen or pelvis. Aortoiliac atherosclerotic vascular disease. Sigmoid colon diverticulosis. SKELETON No focal hypermetabolic activity to suggest skeletal metastasis. Right hip hemiarthroplasty. The mild sclerosis along both sacroiliac joints. Degenerative facet arthropathy in the lower lumbar spine. IMPRESSION: 1. 7.5 cm left apical Pancoast tumor contacts the margins of the left subclavian artery and the aortic arch, and is highly hypermetabolic with maximum SUV 24.3. There may be some early in extension into the left paraspinal adipose tissue but no vertebral/bony invasion. 2. Mildly enlarged left suprahilar and AP window lymph nodes each have a maximum SUV of 4.2, which is enough  above background mediastinal blood pool activity that these nodes are probably metastatic. 3. No findings of metastatic disease to the neck, abdomen/pelvis, or skeleton. 4. Other imaging findings of potential clinical significance: Possible small left adrenal adenoma. Aortic Atherosclerosis (ICD10-I70.0). Sigmoid colon diverticulosis. Mild chronic bilateral sacroiliitis. Facet arthropathy in the lower lumbar spine. Right hip hemiarthroplasty. Electronically Signed   By: Van Clines M.D.   On: 04/27/2017 16:00   Ct Biopsy  Result Date: 04/04/2017 INDICATION: Left upper lobe mass EXAM: CT-GUIDED BIOPSY LARGE LEFT UPPER LOBE MASS MEDICATIONS: 1% LIDOCAINE LOCAL ANESTHESIA/SEDATION: 1.0 mg IV Versed; 25 mcg IV Fentanyl Moderate Sedation Time:  15 MINUTES The patient was continuously monitored during the procedure by the interventional radiology nurse under my direct supervision. PROCEDURE: The procedure, risks, benefits, and alternatives were explained to the patient. Questions regarding the procedure were encouraged and answered. The patient understands and consents to the procedure. previous imaging reviewed. patient position left side down decubitus. noncontrast localization ct performed. the large left upper lobe posterior apical mass was localized. Overlying skin marked. Under sterile conditions and local anesthesia, a 17 gauge coaxial guide needle was advanced from a posterior intercostal approach into the left upper lobe mass medially. Needle position confirmed with CT. 3 18 gauge core biopsies obtained. Samples placed in formalin. Needle tract occlusion performed with the biosentry device. Postprocedure imaging demonstrates no hemorrhage or hematoma. No pneumothorax. Patient tolerated the procedure well without complication. Vital sign monitoring by nursing staff during the procedure will continue as patient is in the special procedures unit for post procedure observation. FINDINGS: The images  document guide needle placement within the large left upper lobe mass. Post biopsy images demonstrate no evidence of pneumothorax or effusion. COMPLICATIONS: None immediate. IMPRESSION: Successful CT-guided core biopsy of the left upper lobe mass Electronically Signed   By: Jerilynn Mages.  Shick M.D.   On: 04/04/2017 15:45   Dg Chest Port 1 View  Result Date: 04/04/2017 CLINICAL DATA:  Upper lobe lung mass. EXAM: PORTABLE CHEST 1 VIEW COMPARISON:  CT chest 03/30/2017. FINDINGS: Normal cardiomediastinal silhouette. LEFT upper lobe mass, similar to priors. No significant post biopsy enlargement or hemothorax. No pneumothorax. Lung fields otherwise clear. IMPRESSION: Satisfactory post biopsy appearance. Electronically Signed   By: Staci Righter M.D.   On: 04/04/2017 19:09       IMPRESSION/PLAN: 1. Stage  IIIB, [id number] NSCLC, squamous cell carcinoma of the left upper lobe. Dr. Lisbeth Renshaw discusses the pathology findings and reviews the nature of locally advanced lung disease. Dr. Lisbeth Renshaw reviews the options of treatment include palliative radiotherapy versus concurrent chemoRT. Although she is somewhat frail, she is interested in aggressive approach. We discussed that proceeding with concurrent treatment would be an option to pursue and detailed the logistics of therapy. We discussed the risks, benefits, short, and long term effects of radiotherapy, and the patient is interested in proceeding. Dr. Lisbeth Renshaw discusses the delivery and schedule of radiotherapy and anticipates a course of 6 1/2 weeks. We will see her back tomorrow to proceed with simulation at 8:00 am. Written consent is obtained and placed in the chart, a copy was provided to the patient. 2. SIADH. The patient will continue under the management of this with medical oncology.   In a visit lasting 60 minutes, greater than 50% of the time was spent face to face discussing her case, and coordinating the patient's care.  The above documentation reflects my direct  findings during this shared patient visit. Please see the separate note by Dr. Lisbeth Renshaw on this date for the remainder of the patient's plan of care.    Carola Rhine, PAC

## 2017-05-04 NOTE — Assessment & Plan Note (Signed)
This is a very pleasant 83 year old white female recently diagnosed with at least stage IIIa (T2b, N2, M0) non-small cell lung cancer, squamous cell carcinoma presented with left upper lobe lung mass left hilar and mediastinal lymphadenopathy diagnosed in November 2018.  PDL 1 is less than 1%.  The patient was seen with Dr. Julien Nordmann.  CT scan of the head did not show any evidence of metastatic disease.  PET scan results were discussed with the patient and we have explained that she does not have any evidence of metastatic disease we had a discussion with patient and her stepdaughter today about her current disease is stage, prognosis and treatment options. Recommend a course of concurrent chemoradiation with weekly carboplatin for AUC of 2 with paclitaxel 45 mg/M2.  Anticipate first dose on 05/21/2017.  A prescription for Compazine 10 mg every 6 hours as needed was sent to her pharmacy.  The patient will be set up for a chemotherapy education class.  She has not yet met with radiation oncology and we have arranged for her to see her after our visit today.  The patient will have a follow-up visit for evaluation prior to cycle 2 of her chemotherapy.  For anxiety, I have refilled a prescription for Xanax 0.25 mg p.o. twice a day as needed.  She was advised to minimize her use of this medication as it is habit-forming.  The patient was advised to call immediately if she has any concerning symptoms in the interval. The patient voices understanding of current disease status and treatment options and is in agreement with the current care plan.  All questions were answered. The patient knows to call the clinic with any problems, questions or concerns. We can certainly see the patient much sooner if necessary.

## 2017-05-07 ENCOUNTER — Ambulatory Visit: Payer: Medicare Other | Admitting: Oncology

## 2017-05-07 ENCOUNTER — Other Ambulatory Visit: Payer: Medicare Other

## 2017-05-10 ENCOUNTER — Emergency Department (HOSPITAL_COMMUNITY): Payer: Medicare Other

## 2017-05-10 ENCOUNTER — Inpatient Hospital Stay (HOSPITAL_COMMUNITY)
Admission: EM | Admit: 2017-05-10 | Discharge: 2017-05-14 | DRG: 644 | Disposition: A | Discharge status: Skilled Nursing Facility | Payer: Medicare Other | Attending: Internal Medicine | Admitting: Internal Medicine

## 2017-05-10 ENCOUNTER — Other Ambulatory Visit: Payer: Self-pay

## 2017-05-10 ENCOUNTER — Encounter (HOSPITAL_COMMUNITY): Payer: Self-pay | Admitting: *Deleted

## 2017-05-10 DIAGNOSIS — E860 Dehydration: Secondary | ICD-10-CM

## 2017-05-10 DIAGNOSIS — I252 Old myocardial infarction: Secondary | ICD-10-CM

## 2017-05-10 DIAGNOSIS — R0602 Shortness of breath: Secondary | ICD-10-CM | POA: Diagnosis not present

## 2017-05-10 DIAGNOSIS — C3412 Malignant neoplasm of upper lobe, left bronchus or lung: Secondary | ICD-10-CM | POA: Diagnosis present

## 2017-05-10 DIAGNOSIS — E222 Syndrome of inappropriate secretion of antidiuretic hormone: Principal | ICD-10-CM | POA: Diagnosis present

## 2017-05-10 DIAGNOSIS — R630 Anorexia: Secondary | ICD-10-CM | POA: Diagnosis present

## 2017-05-10 DIAGNOSIS — Z96641 Presence of right artificial hip joint: Secondary | ICD-10-CM | POA: Diagnosis present

## 2017-05-10 DIAGNOSIS — E871 Hypo-osmolality and hyponatremia: Secondary | ICD-10-CM | POA: Diagnosis not present

## 2017-05-10 DIAGNOSIS — Z888 Allergy status to other drugs, medicaments and biological substances status: Secondary | ICD-10-CM

## 2017-05-10 DIAGNOSIS — F064 Anxiety disorder due to known physiological condition: Secondary | ICD-10-CM | POA: Diagnosis present

## 2017-05-10 DIAGNOSIS — R627 Adult failure to thrive: Secondary | ICD-10-CM | POA: Diagnosis present

## 2017-05-10 DIAGNOSIS — I2510 Atherosclerotic heart disease of native coronary artery without angina pectoris: Secondary | ICD-10-CM | POA: Diagnosis present

## 2017-05-10 DIAGNOSIS — Z6823 Body mass index (BMI) 23.0-23.9, adult: Secondary | ICD-10-CM

## 2017-05-10 DIAGNOSIS — R531 Weakness: Secondary | ICD-10-CM | POA: Diagnosis not present

## 2017-05-10 DIAGNOSIS — R0989 Other specified symptoms and signs involving the circulatory and respiratory systems: Secondary | ICD-10-CM

## 2017-05-10 DIAGNOSIS — I700 Atherosclerosis of aorta: Secondary | ICD-10-CM | POA: Diagnosis present

## 2017-05-10 DIAGNOSIS — Z8673 Personal history of transient ischemic attack (TIA), and cerebral infarction without residual deficits: Secondary | ICD-10-CM

## 2017-05-10 DIAGNOSIS — E785 Hyperlipidemia, unspecified: Secondary | ICD-10-CM | POA: Diagnosis present

## 2017-05-10 DIAGNOSIS — C3492 Malignant neoplasm of unspecified part of left bronchus or lung: Secondary | ICD-10-CM | POA: Diagnosis not present

## 2017-05-10 DIAGNOSIS — Z87891 Personal history of nicotine dependence: Secondary | ICD-10-CM

## 2017-05-10 DIAGNOSIS — F331 Major depressive disorder, recurrent, moderate: Secondary | ICD-10-CM | POA: Diagnosis present

## 2017-05-10 LAB — BASIC METABOLIC PANEL
Anion gap: 10 (ref 5–15)
BUN: 27 mg/dL — AB (ref 6–20)
CHLORIDE: 91 mmol/L — AB (ref 101–111)
CO2: 24 mmol/L (ref 22–32)
Calcium: 10.1 mg/dL (ref 8.9–10.3)
Creatinine, Ser: 1.02 mg/dL — ABNORMAL HIGH (ref 0.44–1.00)
GFR calc Af Amer: 56 mL/min — ABNORMAL LOW (ref >60)
GFR calc non Af Amer: 48 mL/min — ABNORMAL LOW (ref >60)
GLUCOSE: 177 mg/dL — AB (ref 65–99)
POTASSIUM: 3.6 mmol/L (ref 3.5–5.1)
Sodium: 125 mmol/L — ABNORMAL LOW (ref 135–145)

## 2017-05-10 LAB — CBC
HEMATOCRIT: 31.4 % — AB (ref 36.0–46.0)
HEMOGLOBIN: 10.5 g/dL — AB (ref 12.0–15.0)
MCH: 28.2 pg (ref 26.0–34.0)
MCHC: 33.4 g/dL (ref 30.0–36.0)
MCV: 84.2 fL (ref 78.0–100.0)
Platelets: 355 10*3/uL (ref 150–400)
RBC: 3.73 MIL/uL — ABNORMAL LOW (ref 3.87–5.11)
RDW: 13.6 % (ref 11.5–15.5)
WBC: 10.1 10*3/uL (ref 4.0–10.5)

## 2017-05-10 LAB — I-STAT TROPONIN, ED: Troponin i, poc: 0 ng/mL (ref 0.00–0.08)

## 2017-05-10 LAB — OSMOLALITY: Osmolality: 275 mOsm/kg (ref 275–295)

## 2017-05-10 LAB — CK: Total CK: 19 U/L — ABNORMAL LOW (ref 38–234)

## 2017-05-10 LAB — MAGNESIUM: MAGNESIUM: 1.8 mg/dL (ref 1.7–2.4)

## 2017-05-10 MED ORDER — GUAIFENESIN ER 600 MG PO TB12
1200.0000 mg | ORAL_TABLET | Freq: Two times a day (BID) | ORAL | Status: DC
  Administered 2017-05-10 – 2017-05-14 (×8): 1200 mg via ORAL
  Filled 2017-05-10 (×8): qty 2

## 2017-05-10 MED ORDER — ENSURE ENLIVE PO LIQD
237.0000 mL | Freq: Two times a day (BID) | ORAL | Status: DC
  Administered 2017-05-11 – 2017-05-14 (×7): 237 mL via ORAL

## 2017-05-10 MED ORDER — ADULT MULTIVITAMIN W/MINERALS CH
1.0000 | ORAL_TABLET | Freq: Every day | ORAL | Status: DC
  Administered 2017-05-10 – 2017-05-13 (×4): 1 via ORAL
  Filled 2017-05-10 (×4): qty 1

## 2017-05-10 MED ORDER — SODIUM CHLORIDE 0.9 % IV BOLUS (SEPSIS)
1000.0000 mL | Freq: Once | INTRAVENOUS | Status: AC
  Administered 2017-05-10: 1000 mL via INTRAVENOUS

## 2017-05-10 MED ORDER — PAROXETINE HCL 20 MG PO TABS
20.0000 mg | ORAL_TABLET | Freq: Every day | ORAL | Status: DC
  Administered 2017-05-10: 20 mg via ORAL
  Filled 2017-05-10: qty 1

## 2017-05-10 MED ORDER — MAGNESIUM SULFATE 2 GM/50ML IV SOLN: 2.0000 g | Freq: Once | INTRAVENOUS | Status: DC

## 2017-05-10 MED ORDER — IPRATROPIUM-ALBUTEROL 0.5-2.5 (3) MG/3ML IN SOLN: 3.0000 mL | Freq: Four times a day (QID) | RESPIRATORY_TRACT | Status: DC | PRN

## 2017-05-10 MED ORDER — LISINOPRIL 20 MG PO TABS
20.0000 mg | ORAL_TABLET | Freq: Every day | ORAL | Status: DC
  Administered 2017-05-11: 20 mg via ORAL
  Filled 2017-05-10: qty 1

## 2017-05-10 MED ORDER — ASPIRIN EC 81 MG PO TBEC
81.0000 mg | DELAYED_RELEASE_TABLET | Freq: Every day | ORAL | Status: DC
  Administered 2017-05-11 – 2017-05-14 (×4): 81 mg via ORAL
  Filled 2017-05-10 (×4): qty 1

## 2017-05-10 MED ORDER — IPRATROPIUM-ALBUTEROL 0.5-2.5 (3) MG/3ML IN SOLN
3.0000 mL | RESPIRATORY_TRACT | Status: DC
Start: 2017-05-10 — End: 2017-05-10
  Administered 2017-05-10: 3 mL via RESPIRATORY_TRACT
  Filled 2017-05-10 (×2): qty 3

## 2017-05-10 MED ORDER — NITROGLYCERIN 0.4 MG SL SUBL: 0.4000 mg | SUBLINGUAL_TABLET | SUBLINGUAL | Status: DC | PRN

## 2017-05-10 MED ORDER — ALPRAZOLAM 0.25 MG PO TABS
0.2500 mg | ORAL_TABLET | Freq: Two times a day (BID) | ORAL | Status: DC | PRN
  Administered 2017-05-11: 0.25 mg via ORAL
  Filled 2017-05-10 (×3): qty 1

## 2017-05-10 MED ORDER — IPRATROPIUM-ALBUTEROL 0.5-2.5 (3) MG/3ML IN SOLN
3.0000 mL | Freq: Three times a day (TID) | RESPIRATORY_TRACT | Status: DC
  Administered 2017-05-11 – 2017-05-12 (×4): 3 mL via RESPIRATORY_TRACT
  Filled 2017-05-10 (×4): qty 3

## 2017-05-10 MED ORDER — SODIUM CHLORIDE 0.9 % IV SOLN
INTRAVENOUS | Status: DC
  Administered 2017-05-10: 22:00:00 via INTRAVENOUS

## 2017-05-10 MED ORDER — PROCHLORPERAZINE MALEATE 10 MG PO TABS
10.0000 mg | ORAL_TABLET | Freq: Four times a day (QID) | ORAL | Status: DC | PRN
  Filled 2017-05-10: qty 1

## 2017-05-10 NOTE — Progress Notes (Signed)
Pt stated she does not take breathing treatments at home, pt breathing is not labored and no distress noted. Pt stated she did not want to take treatments, and wanted to be left alone. RT completed assessment for treatment schedule, will continue to monitor as needed.

## 2017-05-10 NOTE — ED Provider Notes (Signed)
[facility] Provider Note   CSN: [id number] Arrival date & time: 05/10/17  1441     History   Chief Complaint Chief Complaint  Patient presents with  . Shortness of Breath  . Weakness    HPI [name] is a 83 y.o. female.  The history is provided by the patient and medical records.  Shortness of Breath  This is a new problem. The problem occurs continuously.The problem has not changed since onset.Associated symptoms include cough (baseline) and sputum production (baseline). Pertinent negatives include no fever. She has tried nothing for the symptoms. The treatment provided no relief. She has had prior hospitalizations. She has had prior ED visits. She has had no prior ICU admissions.  Weakness  Associated symptoms include shortness of breath.    Past Medical History:  Diagnosis Date  . Abdominal aortic atherosclerosis ([place])   . CAD (coronary artery disease)   . Carotid artery occlusion   . Common bile duct dilation   . Gallstones   . Headache(784.0)   . Hiatal hernia   . HLD (hyperlipidemia)   . Myocardial infarction ([place])   . PONV (postoperative nausea and vomiting)   . Stroke [facility]) 2003    Patient Active Problem List   Diagnosis Date Noted  . Dehydration 05/10/2017  . Weakness 05/10/2017  . Squamous cell lung cancer, left ([place]) 05/04/2017  . Encounter for antineoplastic chemotherapy 05/04/2017  . Goals of care, counseling/discussion 05/04/2017  . Stage III squamous cell carcinoma of left lung ([place]) 05/03/2017  . Chest congestion 04/03/2017  . Constipation 04/03/2017  . Adenoma of left adrenal gland 04/03/2017  . SIADH (syndrome of inappropriate ADH production) ([place]) 04/02/2017  . Lung mass 04/02/2017  . Elevated AST (SGOT) 04/02/2017  . Leukocytosis 04/02/2017  . Normocytic anemia 04/02/2017  . Cerumen impaction 11/19/2015  . Hip fracture requiring operative repair ([place]) 01/09/2014  . Closed right hip fracture ([place]) 01/09/2014    . Hyponatremia 01/09/2014  . Symptomatic cholelithiasis 03/20/2013  . Dilated bile duct 03/20/2013  . Abdominal aortic atherosclerosis ([place])   . Hypertension 10/19/2011  . Tobacco abuse 02/18/2009  . HLD (hyperlipidemia) 02/17/2009  . ANXIETY DISORDER 02/17/2009  . CAD (coronary artery disease) 02/17/2009  . CAROTID ARTERY OCCLUSION 02/17/2009  . CVA 02/17/2009    Past Surgical History:  Procedure Laterality Date  . ABDOMINAL HYSTERECTOMY    . ANKLE FRACTURE SURGERY    . APPENDECTOMY    . BLADDER SURGERY    . CHOLECYSTECTOMY N/A 05/05/2013   Procedure: LAPAROSCOPIC CHOLECYSTECTOMY WITH INTRAOPERATIVE CHOLANGIOGRAM;  Surgeon: Ralene Ok, MD;  Location: [place];  Service: General;  Laterality: N/A;  . HIP ARTHROPLASTY Right 01/10/2014   Procedure: RIGHT HIP HEMIARTHROPLASTY;  Surgeon: Johnn Hai, MD;  Location: [facility];  Service: Orthopedics;  Laterality: Right;  DEPUY AML LATERAL WITH MARK II  . ROTATOR CUFF REPAIR Left   . TONSILLECTOMY      OB History    No data available       Home Medications    Prior to Admission medications   Medication Sig Start Date End Date Taking? Authorizing Provider  ALPRAZolam (XANAX) 0.25 MG tablet Take 1 tablet (0.25 mg total) by mouth 2 (two) times daily as needed for anxiety. 05/03/17  Yes CurcioRoselie Awkward, NP  aspirin 81 MG EC tablet Take 81 mg by mouth daily.     Yes [provider]  Calcium Carbonate-Vitamin D (CALCIUM + D PO) Take 1 tablet by  mouth daily.   Yes [provider]  feeding supplement, ENSURE ENLIVE, (ENSURE ENLIVE) LIQD Take 237 mLs 2 (two) times daily between meals by mouth. 04/05/17  Yes Mariel Aloe, MD  Glucosamine-Chondroitin (GLUCOSAMINE CHONDR COMPLEX PO) Take 1 capsule by mouth daily.   Yes [provider]  guaiFENesin (MUCINEX) 600 MG 12 hr tablet Take 2 tablets (1,200 mg total) 2 (two) times daily by mouth. 04/04/17  Yes Mariel Aloe, MD  lisinopril (PRINIVIL,ZESTRIL) 20  MG tablet Take 1 tablet (20 mg total) daily by mouth. 04/05/17  Yes Mariel Aloe, MD  Multiple Vitamin (MULTIVITAMIN WITH MINERALS) TABS tablet Take 1 tablet by mouth at bedtime.   Yes [provider]  nitroGLYCERIN (NITROSTAT) 0.4 MG SL tablet Place 0.4 mg under the tongue every 5 (five) minutes as needed for chest pain.   Yes [provider]  PARoxetine (PAXIL) 20 MG tablet Take 20 mg by mouth at bedtime.   Yes [provider]  polyethylene glycol (MIRALAX / GLYCOLAX) packet Take 17 g daily as needed by mouth. 04/04/17  Yes Mariel Aloe, MD  prochlorperazine (COMPAZINE) 10 MG tablet Take 1 tablet (10 mg total) by mouth every 6 (six) hours as needed for nausea or vomiting. 05/03/17  Yes Curcio, Roselie Awkward, NP    Family History Family History  Problem Relation Age of Onset  . Stroke Mother   . Breast cancer Maternal Aunt   . Prostate cancer Maternal Grandfather     Social History Social History   Tobacco Use  . Smoking status: Former Smoker    Types: Cigarettes    Last attempt to quit: 03/24/2017    Years since quitting: 0.1  . Smokeless tobacco: Never Used  Substance Use Topics  . Alcohol use: No  . Drug use: No     Allergies   Dextroamphetamine sulfate er and Other   Review of Systems Review of Systems  Constitutional: Negative for fever.  Respiratory: Positive for cough (baseline), sputum production (baseline) and shortness of breath.   Neurological: Positive for weakness.  All other systems reviewed and are negative.    Physical Exam Updated Vital Signs BP (!) 159/62 (BP Location: Left Arm)   Pulse 75   Temp 98 F (36.7 C) (Oral)   Resp 17   Ht 5\' 1"  (1.549 m)   SpO2 97%   BMI 23.68 kg/m   Physical Exam  Constitutional: She appears well-developed and well-nourished.  HENT:  Head: Normocephalic and atraumatic.  Eyes: Conjunctivae and EOM are normal.  Neck: Normal range of motion.  Cardiovascular: Normal rate and regular  rhythm.  Pulmonary/Chest: Effort normal. No stridor. No respiratory distress. She has wheezes (faint).  Abdominal: She exhibits no distension.  Musculoskeletal: Normal range of motion. She exhibits no edema or deformity.  Neurological: She is alert.  Skin: Skin is warm and dry.  Nursing note and vitals reviewed.    ED Treatments / Results  Labs (all labs ordered are listed, but only abnormal results are displayed) Labs Reviewed  BASIC METABOLIC PANEL - Abnormal; Notable for the following components:      Result Value   Sodium 125 (*)    Chloride 91 (*)    Glucose, Bld 177 (*)    BUN 27 (*)    Creatinine, Ser 1.02 (*)    GFR calc non Af Amer 48 (*)    GFR calc Af Amer 56 (*)    All other components within normal limits  CBC -  Abnormal; Notable for the following components:   RBC 3.73 (*)    Hemoglobin 10.5 (*)    HCT 31.4 (*)    All other components within normal limits  CK - Abnormal; Notable for the following components:   Total CK 19 (*)    All other components within normal limits  MAGNESIUM  OSMOLALITY  URINALYSIS, ROUTINE W REFLEX MICROSCOPIC  OSMOLALITY, URINE  SODIUM, URINE, RANDOM  BASIC METABOLIC PANEL  CBC  I-STAT TROPONIN, ED    EKG  EKG Interpretation  Date/Time:  Thursday May 10 2017 14:49:48 EST Ventricular Rate:  78 PR Interval:  154 QRS Duration: 140 QT Interval:  416 QTC Calculation: 474 R Axis:   7 Text Interpretation:  Normal sinus rhythm Non-specific intra-ventricular conduction block Abnormal QRS-T angle, consider primary T wave abnormality Abnormal ECG No significant change since last tracing Confirmed by Zenovia Jarred [phone]) on 05/10/2017 2:54:27 PM       Radiology Dg Chest 2 View  Result Date: 05/10/2017 CLINICAL DATA:  Shortness of breath and productive cough. EXAM: CHEST  2 VIEW COMPARISON:  Head CT dated April 27, 2017. Chest x-ray dated April 04, 2017. FINDINGS: The cardiomediastinal silhouette is normal in size.  Normal pulmonary vascularity. Unchanged left upper lobe mass. No focal consolidation, pleural effusion, or pneumothorax. No acute osseous abnormality. IMPRESSION: 1. Unchanged left upper lobe lung mass. No active cardiopulmonary disease. Electronically Signed   By: Titus Dubin M.D.   On: 05/10/2017 15:46   Ct Head Wo Contrast  Result Date: 05/10/2017 CLINICAL DATA:  Weakness and difficulty ambulating beginning today. History of stroke. EXAM: CT HEAD WITHOUT CONTRAST TECHNIQUE: Contiguous axial images were obtained from the base of the skull through the vertex without intravenous contrast. COMPARISON:  04/27/2017 FINDINGS: Brain: Ventricles and cisterns are normal. Remaining CSF spaces are unremarkable. There is moderate chronic ischemic microvascular disease. There is a small old left frontal infarct. There is no mass, mass effect, shift of midline structures or acute hemorrhage. No evidence of acute infarction. Vascular: No hyperdense vessel or unexpected calcification. Skull: Normal. Negative for fracture or focal lesion. Sinuses/Orbits: No acute finding. Other: None. IMPRESSION: No acute intracranial findings. Moderate chronic ischemic microvascular disease. Small old left frontal infarct. Electronically Signed   By: Marin Olp M.D.   On: 05/10/2017 18:02    Procedures Procedures (including critical care time)  Medications Ordered in ED Medications  ipratropium-albuterol (DUONEB) 0.5-2.5 (3) MG/3ML nebulizer solution 3 mL (not administered)  ipratropium-albuterol (DUONEB) 0.5-2.5 (3) MG/3ML nebulizer solution 3 mL (not administered)  aspirin EC tablet 81 mg (not administered)  nitroGLYCERIN (NITROSTAT) SL tablet 0.4 mg (not administered)  multivitamin with minerals tablet 1 tablet (1 tablet Oral Given 05/10/17 2305)  lisinopril (PRINIVIL,ZESTRIL) tablet 20 mg (not administered)  guaiFENesin (MUCINEX) 12 hr tablet 1,200 mg (1,200 mg Oral Given 05/10/17 2306)  feeding supplement (ENSURE  ENLIVE) (ENSURE ENLIVE) liquid 237 mL (not administered)  prochlorperazine (COMPAZINE) tablet 10 mg (not administered)  ALPRAZolam (XANAX) tablet 0.25 mg (0.25 mg Oral Not Given 05/10/17 2305)  PARoxetine (PAXIL) tablet 20 mg (20 mg Oral Given 05/10/17 2305)  0.9 %  sodium chloride infusion ( Intravenous New Bag/Given 05/10/17 2224)  sodium chloride 0.9 % bolus 1,000 mL (1,000 mLs Intravenous New Bag/Given 05/10/17 1648)     Initial Impression / Assessment and Plan / ED Course  I have reviewed the triage vital signs and the nursing notes.  Pertinent labs & imaging results that were available during my care of  the patient were reviewed by me and considered in my medical decision making (see chart for details).   Patient generalized weakness she states is similar to previous episodes of hyponatremia and her sodium here is 125.  Does not entirely low it is lower than when she has been in the past.  We will start with head CT secondary to her history of cancer to make sure she has any metastatic lesions.  We will also add on magnesium for generalized weakness.  Start with a liter of fluids and see if this helps her feel any better.  Suspect patient is also slightly dehydrated.  Low suspicion for cardiac cause at this time but will get a urinalysis to make sure no infection.   Final Clinical Impressions(s) / ED Diagnoses   Final diagnoses:  Weakness  Hyponatremia     Bowie Delia, Corene Cornea, MD 05/11/17 5366

## 2017-05-10 NOTE — ED Notes (Signed)
Patient denies pain and is resting comfortably.  

## 2017-05-10 NOTE — ED Triage Notes (Signed)
Pt reports feeling bad with sob and weakness, difficulty being able to ambulate. Recently diagnosed with lung cancer and is scheduled to start chemo on Monday.

## 2017-05-10 NOTE — H&P (Signed)
History and Physical    [name] RKY:[id number] DOB: 09-13-1928 DOA: 05/10/2017  PCP: Leonard Downing, MD  Patient coming from:  home  Chief Complaint: weak  HPI: [name] is a 83 y.o. female with medical history significant ofrecent diagnosis of lung cancer starting chemo on Monday has had decreased appetite and po intake for weeks.  Comes in with generalized weakness.  No fevers.  No n/v/d.  Just no appetitie. No pain anywhere.  No cough.  Pt has had SIADH with na levels hanging around 130.  Pt referred for admission for hyponatremia and dehydration with na level of 125.  Review of Systems: As per HPI otherwise 10 point review of systems negative.   Past Medical History:  Diagnosis Date  . Abdominal aortic atherosclerosis ([place])   . CAD (coronary artery disease)   . Carotid artery occlusion   . Common bile duct dilation   . Gallstones   . Headache(784.0)   . Hiatal hernia   . HLD (hyperlipidemia)   . Myocardial infarction ([place])   . PONV (postoperative nausea and vomiting)   . Stroke [facility]) 2003    Past Surgical History:  Procedure Laterality Date  . ABDOMINAL HYSTERECTOMY    . ANKLE FRACTURE SURGERY    . APPENDECTOMY    . BLADDER SURGERY    . CHOLECYSTECTOMY N/A 05/05/2013   Procedure: LAPAROSCOPIC CHOLECYSTECTOMY WITH INTRAOPERATIVE CHOLANGIOGRAM;  Surgeon: Ralene Ok, MD;  Location: [place];  Service: General;  Laterality: N/A;  . HIP ARTHROPLASTY Right 01/10/2014   Procedure: RIGHT HIP HEMIARTHROPLASTY;  Surgeon: Johnn Hai, MD;  Location[facility];  Service: Orthopedics;  Laterality: Right;  DEPUY AML LATERAL WITH MARK II  . ROTATOR CUFF REPAIR Left   . TONSILLECTOMY       reports that she quit smoking about 6 weeks ago. Her smoking use included cigarettes. she has never used smokeless tobacco. She reports that she does not drink alcohol or use drugs.  Allergies  Allergen Reactions  . Dextroamphetamine Sulfate Er Other (See Comments)    Euphoric feeling  . Other     Other reaction(s): Other (See Comments) Euphoric feeling    Family History  Problem Relation Age of Onset  . Stroke Mother   . Breast cancer Maternal Aunt   . Prostate cancer Maternal Grandfather     Prior to Admission medications   Medication Sig Start Date End Date Taking? Authorizing Provider  ALPRAZolam (XANAX) 0.25 MG tablet Take 1 tablet (0.25 mg total) by mouth 2 (two) times daily as needed for anxiety. 05/03/17  Yes CurcioRoselie Awkward, NP  aspirin 81 MG EC tablet Take 81 mg by mouth daily.     Yes [provider]  Calcium Carbonate-Vitamin D (CALCIUM + D PO) Take 1 tablet by mouth daily.   Yes [provider]  feeding supplement, ENSURE ENLIVE, (ENSURE ENLIVE) LIQD Take 237 mLs 2 (two) times daily between meals by mouth. 04/05/17  Yes Mariel Aloe, MD  Glucosamine-Chondroitin (GLUCOSAMINE CHONDR COMPLEX PO) Take 1 capsule by mouth daily.   Yes [provider]  guaiFENesin (MUCINEX) 600 MG 12 hr tablet Take 2 tablets (1,200 mg total) 2 (two) times daily by mouth. 04/04/17  Yes Mariel Aloe, MD  lisinopril (PRINIVIL,ZESTRIL) 20 MG tablet Take 1 tablet (20 mg total) daily by mouth. 04/05/17  Yes Mariel Aloe, MD  Multiple Vitamin (MULTIVITAMIN WITH MINERALS) TABS tablet Take 1 tablet by mouth at bedtime.   Yes [provider]  nitroGLYCERIN (NITROSTAT) 0.4 MG SL tablet Place 0.4 mg under the tongue every 5 (five) minutes as needed for chest pain.   Yes [provider]  PARoxetine (PAXIL) 20 MG tablet Take 20 mg by mouth at bedtime.   Yes [provider]  polyethylene glycol (MIRALAX / GLYCOLAX) packet Take 17 g daily as needed by mouth. 04/04/17  Yes Mariel Aloe, MD  prochlorperazine (COMPAZINE) 10 MG tablet Take 1 tablet (10 mg total) by mouth every 6 (six) hours as needed for nausea or vomiting. 05/03/17  Yes Maryanna Shape, NP    Physical Exam: Vitals:   05/10/17 1830 05/10/17  1900 05/10/17 1915 05/10/17 1930  BP: (!) 143/48 (!) 122/49  (!) 124/52  Pulse: 80 74 75 73  Resp: 17 16 16 15   Temp:      TempSrc:      SpO2: 92% 91% 93% 93%      Constitutional: NAD, calm, comfortable Vitals:   05/10/17 1830 05/10/17 1900 05/10/17 1915 05/10/17 1930  BP: (!) 143/48 (!) 122/49  (!) 124/52  Pulse: 80 74 75 73  Resp: 17 16 16 15   Temp:      TempSrc:      SpO2: 92% 91% 93% 93%   Eyes: PERRL, lids and conjunctivae normal ENMT: Mucous membranes are dry. Posterior pharynx clear of any exudate or lesions.Normal dentition.  Neck: normal, supple, no masses, no thyromegaly Respiratory: clear to auscultation bilaterally, no wheezing, no crackles. Normal respiratory effort. No accessory muscle use.  Cardiovascular: Regular rate and rhythm, no murmurs / rubs / gallops. No extremity edema. 2+ pedal pulses. No carotid bruits.  Abdomen: no tenderness, no masses palpated. No hepatosplenomegaly. Bowel sounds positive.  Musculoskeletal: no clubbing / cyanosis. No joint deformity upper and lower extremities. Good ROM, no contractures. Normal muscle tone.  Skin: no rashes, lesions, ulcers. No induration Neurologic: CN 2-12 grossly intact. Sensation intact, DTR normal. Strength 5/5 in all 4.  Psychiatric: Normal judgment and insight. Alert and oriented x 3. Normal mood.    Labs on Admission: I have personally reviewed following labs and imaging studies  CBC: Recent Labs  Lab 05/10/17 1448  WBC 10.1  HGB 10.5*  HCT 31.4*  MCV 84.2  PLT 628   Basic Metabolic Panel: Recent Labs  Lab 05/10/17 1448 05/10/17 1653  NA 125*  --   K 3.6  --   CL 91*  --   CO2 24  --   GLUCOSE 177*  --   BUN 27*  --   CREATININE 1.02*  --   CALCIUM 10.1  --   MG  --  1.8   GFR: Estimated Creatinine Clearance: 29.9 mL/min (A) (by C-G formula based on SCr of 1.02 mg/dL (H)). Liver Function Tests: No results for input(s): AST, ALT, ALKPHOS, BILITOT, PROT, ALBUMIN in the last 168  hours. No results for input(s): LIPASE, AMYLASE in the last 168 hours. No results for input(s): AMMONIA in the last 168 hours. Coagulation Profile: No results for input(s): INR, PROTIME in the last 168 hours. Cardiac Enzymes: Recent Labs  Lab 05/10/17 1653  CKTOTAL 19*   BNP (last 3 results) No results for input(s): PROBNP in the last 8760 hours. HbA1C: No results for input(s): HGBA1C in the last 72 hours. CBG: No results for input(s): GLUCAP in the last 168 hours. Lipid Profile: No results for input(s): CHOL, HDL, LDLCALC, TRIG, CHOLHDL, LDLDIRECT in the last 72 hours. Thyroid Function Tests: No results for  input(s): TSH, T4TOTAL, FREET4, T3FREE, THYROIDAB in the last 72 hours. Anemia Panel: No results for input(s): VITAMINB12, FOLATE, FERRITIN, TIBC, IRON, RETICCTPCT in the last 72 hours. Urine analysis:    Component Value Date/Time   COLORURINE YELLOW 03/30/2017 1750   APPEARANCEUR HAZY (A) 03/30/2017 1750   LABSPEC 1.020 03/30/2017 1750   PHURINE 7.0 03/30/2017 1750   GLUCOSEU NEGATIVE 03/30/2017 1750   HGBUR SMALL (A) 03/30/2017 1750   BILIRUBINUR NEGATIVE 03/30/2017 1750   KETONESUR NEGATIVE 03/30/2017 1750   PROTEINUR 30 (A) 03/30/2017 1750   UROBILINOGEN 0.2 01/09/2014 1448   NITRITE POSITIVE (A) 03/30/2017 1750   LEUKOCYTESUR TRACE (A) 03/30/2017 1750   Sepsis Labs: !!!!!!!!!!!!!!!!!!!!!!!!!!!!!!!!!!!!!!!!!!!! @LABRCNTIP (procalcitonin:4,lacticidven:4) )No results found for this or any previous visit (from the past 240 hour(s)).   Radiological Exams on Admission: Dg Chest 2 View  Result Date: 05/10/2017 CLINICAL DATA:  Shortness of breath and productive cough. EXAM: CHEST  2 VIEW COMPARISON:  Head CT dated April 27, 2017. Chest x-ray dated April 04, 2017. FINDINGS: The cardiomediastinal silhouette is normal in size. Normal pulmonary vascularity. Unchanged left upper lobe mass. No focal consolidation, pleural effusion, or pneumothorax. No acute osseous  abnormality. IMPRESSION: 1. Unchanged left upper lobe lung mass. No active cardiopulmonary disease. Electronically Signed   By: Titus Dubin M.D.   On: 05/10/2017 15:46   Ct Head Wo Contrast  Result Date: 05/10/2017 CLINICAL DATA:  Weakness and difficulty ambulating beginning today. History of stroke. EXAM: CT HEAD WITHOUT CONTRAST TECHNIQUE: Contiguous axial images were obtained from the base of the skull through the vertex without intravenous contrast. COMPARISON:  04/27/2017 FINDINGS: Brain: Ventricles and cisterns are normal. Remaining CSF spaces are unremarkable. There is moderate chronic ischemic microvascular disease. There is a small old left frontal infarct. There is no mass, mass effect, shift of midline structures or acute hemorrhage. No evidence of acute infarction. Vascular: No hyperdense vessel or unexpected calcification. Skull: Normal. Negative for fracture or focal lesion. Sinuses/Orbits: No acute finding. Other: None. IMPRESSION: No acute intracranial findings. Moderate chronic ischemic microvascular disease. Small old left frontal infarct. Electronically Signed   By: Marin Olp M.D.   On: 05/10/2017 18:02    EKG: Independently reviewed.  nsr nonsp ivcd Old chart reviewed Case discussed with edp  Assessment/Plan 83 yo female with new lung mass with dehydration and hyponatemia  Principal Problem:   Hyponatremia- likely component of dehydration.  Ck spot na, urine and serum osm.  Ns 2 liters of ivf given in ED.  Ns overnight.  ua pending  Active Problems:   CAD (coronary artery disease)- noted, stable   SIADH (syndrome of inappropriate ADH production) ([place])- noted, ck labs   Squamous cell lung cancer, left ([place])- noted   Dehydration- ivf   Weakness- due to above     DVT prophylaxis:  scds  Code Status:  full Family Communication:  none Disposition Plan:  Per day team Consults called:  none Admission status:  observation   Tanisia Yokley A MD [facility]  If 7PM-7AM, please contact night-coverage [email or web address] Password [facility]  05/10/2017, 7:54 PM

## 2017-05-11 DIAGNOSIS — I252 Old myocardial infarction: Secondary | ICD-10-CM | POA: Diagnosis not present

## 2017-05-11 DIAGNOSIS — F331 Major depressive disorder, recurrent, moderate: Secondary | ICD-10-CM

## 2017-05-11 DIAGNOSIS — Z96641 Presence of right artificial hip joint: Secondary | ICD-10-CM | POA: Diagnosis present

## 2017-05-11 DIAGNOSIS — Z888 Allergy status to other drugs, medicaments and biological substances status: Secondary | ICD-10-CM | POA: Diagnosis not present

## 2017-05-11 DIAGNOSIS — R0602 Shortness of breath: Secondary | ICD-10-CM | POA: Diagnosis present

## 2017-05-11 DIAGNOSIS — R630 Anorexia: Secondary | ICD-10-CM | POA: Diagnosis present

## 2017-05-11 DIAGNOSIS — F329 Major depressive disorder, single episode, unspecified: Secondary | ICD-10-CM

## 2017-05-11 DIAGNOSIS — Z8673 Personal history of transient ischemic attack (TIA), and cerebral infarction without residual deficits: Secondary | ICD-10-CM | POA: Diagnosis not present

## 2017-05-11 DIAGNOSIS — C3412 Malignant neoplasm of upper lobe, left bronchus or lung: Secondary | ICD-10-CM | POA: Diagnosis present

## 2017-05-11 DIAGNOSIS — R450 Nervousness: Secondary | ICD-10-CM

## 2017-05-11 DIAGNOSIS — I700 Atherosclerosis of aorta: Secondary | ICD-10-CM | POA: Diagnosis present

## 2017-05-11 DIAGNOSIS — R627 Adult failure to thrive: Secondary | ICD-10-CM | POA: Diagnosis present

## 2017-05-11 DIAGNOSIS — I2510 Atherosclerotic heart disease of native coronary artery without angina pectoris: Secondary | ICD-10-CM | POA: Diagnosis present

## 2017-05-11 DIAGNOSIS — F064 Anxiety disorder due to known physiological condition: Secondary | ICD-10-CM | POA: Diagnosis present

## 2017-05-11 DIAGNOSIS — Z87891 Personal history of nicotine dependence: Secondary | ICD-10-CM

## 2017-05-11 DIAGNOSIS — F419 Anxiety disorder, unspecified: Secondary | ICD-10-CM | POA: Diagnosis not present

## 2017-05-11 DIAGNOSIS — E860 Dehydration: Secondary | ICD-10-CM | POA: Diagnosis present

## 2017-05-11 DIAGNOSIS — E871 Hypo-osmolality and hyponatremia: Secondary | ICD-10-CM | POA: Diagnosis not present

## 2017-05-11 DIAGNOSIS — Z6823 Body mass index (BMI) 23.0-23.9, adult: Secondary | ICD-10-CM | POA: Diagnosis not present

## 2017-05-11 DIAGNOSIS — E785 Hyperlipidemia, unspecified: Secondary | ICD-10-CM | POA: Diagnosis present

## 2017-05-11 DIAGNOSIS — E222 Syndrome of inappropriate secretion of antidiuretic hormone: Secondary | ICD-10-CM | POA: Diagnosis present

## 2017-05-11 LAB — CBC
HEMATOCRIT: 30 % — AB (ref 36.0–46.0)
HEMOGLOBIN: 9.8 g/dL — AB (ref 12.0–15.0)
MCH: 28 pg (ref 26.0–34.0)
MCHC: 32.7 g/dL (ref 30.0–36.0)
MCV: 85.7 fL (ref 78.0–100.0)
PLATELETS: 332 10*3/uL (ref 150–400)
RBC: 3.5 MIL/uL — AB (ref 3.87–5.11)
RDW: 14.2 % (ref 11.5–15.5)
WBC: 11.4 10*3/uL — AB (ref 4.0–10.5)

## 2017-05-11 LAB — BASIC METABOLIC PANEL
ANION GAP: 6 (ref 5–15)
ANION GAP: 7 (ref 5–15)
BUN: 20 mg/dL (ref 6–20)
BUN: 18 mg/dL (ref 6–20)
CHLORIDE: 98 mmol/L — AB (ref 101–111)
CO2: 23 mmol/L (ref 22–32)
CO2: 23 mmol/L (ref 22–32)
Calcium: 9.3 mg/dL (ref 8.9–10.3)
Calcium: 9.4 mg/dL (ref 8.9–10.3)
Chloride: 98 mmol/L — ABNORMAL LOW (ref 101–111)
Creatinine, Ser: 0.75 mg/dL (ref 0.44–1.00)
Creatinine, Ser: 0.71 mg/dL (ref 0.44–1.00)
GFR calc Af Amer: >60 mL/min (ref >60)
GFR calc Af Amer: >60 mL/min (ref >60)
GFR calc non Af Amer: >60 mL/min (ref >60)
GFR calc non Af Amer: >60 mL/min (ref >60)
GLUCOSE: 109 mg/dL — AB (ref 65–99)
Glucose, Bld: 115 mg/dL — ABNORMAL HIGH (ref 65–99)
POTASSIUM: 4 mmol/L (ref 3.5–5.1)
POTASSIUM: 4.4 mmol/L (ref 3.5–5.1)
SODIUM: 127 mmol/L — AB (ref 135–145)
Sodium: 128 mmol/L — ABNORMAL LOW (ref 135–145)

## 2017-05-11 MED ORDER — MIRTAZAPINE 7.5 MG PO TABS
7.5000 mg | ORAL_TABLET | Freq: Every day | ORAL | Status: DC
  Administered 2017-05-11 – 2017-05-13 (×3): 7.5 mg via ORAL
  Filled 2017-05-11 (×3): qty 1

## 2017-05-11 MED ORDER — SODIUM CHLORIDE 0.9 % IV SOLN
INTRAVENOUS | Status: DC
  Administered 2017-05-11: 11:00:00 via INTRAVENOUS

## 2017-05-11 NOTE — Evaluation (Signed)
Physical Therapy Evaluation Patient Details Name: [name] MRN: [id number] DOB: 12-May-1929 Today's Date: 05/11/2017   History of Present Illness  Pt is an 83 y/o female admitted secondary to generalized weakness and found to be hyponatremic. Pt with a recent diagnosis of lung cancer and was planned to start chemotherapy on Monday (12/24). PMH including but not limited to CAD, HTN, hx of CVA and MI.    Clinical Impression  Pt presented supine in bed with HOB elevated, awake and willing to participate in therapy session. Prior to admission, pt reported that she ambulates with use of RW and required partial assistance from her daughter with bathing. Pt lives with her daughter who she reports is available 24/7 to assist her. Pt currently requires mod A for bed mobility and mod-max A x2 for transfers. Pt very limited secondary to fatigue and weakness. Pt would continue to benefit from skilled physical therapy services at this time while admitted and after d/c to address the below listed limitations in order to improve overall safety and independence with functional mobility.     Follow Up Recommendations SNF;Supervision/Assistance - 24 hour    Equipment Recommendations  None recommended by PT    Recommendations for Other Services       Precautions / Restrictions Precautions Precautions: Fall Restrictions Weight Bearing Restrictions: No      Mobility  Bed Mobility Overal bed mobility: Needs Assistance Bed Mobility: Supine to Sit     Supine to sit: Min assist     General bed mobility comments: increased time and effort, cueing for technique/sequencing, assist to elevate trunk to achieve upright sitting at EOB  Transfers Overall transfer level: Needs assistance Equipment used: 2 person hand held assist Transfers: Sit to/from [facility] Sit to Stand: Mod assist;+2 physical assistance Stand pivot transfers: Max assist;+2 physical assistance        General transfer comment: increased time and effort, verbal cueing for technique, assist to power into standing from sitting EOB and with pivotal movements to chair. Pt very anxious and fearful during transfer  Ambulation/Gait                Stairs            Wheelchair Mobility    Modified Rankin (Stroke Patients Only)       Balance Overall balance assessment: Needs assistance Sitting-balance support: Feet supported Sitting balance-Leahy Scale: Fair     Standing balance support: During functional activity;Bilateral upper extremity supported Standing balance-Leahy Scale: Poor                               Pertinent Vitals/Pain Pain Assessment: No/denies pain    Home Living Family/patient expects to be discharged to:: Private residence Living Arrangements: Children Available Help at Discharge: Family;Available 24 hours/day Type of Home: House Home Access: Stairs to enter   [employer] of Steps: 2 Home Layout: One level Home Equipment: Walker - 2 wheels;Bedside commode;Cane - single point      Prior Function Level of Independence: Needs assistance   Gait / Transfers Assistance Needed: pt recently began using RW to ambulate  ADL's / Homemaking Assistance Needed: requires some assistance with bathing        Hand Dominance        Extremity/Trunk Assessment   Upper Extremity Assessment Upper Extremity Assessment: Generalized weakness    Lower Extremity Assessment Lower Extremity Assessment: Generalized weakness  Communication   Communication: No difficulties  Cognition Arousal/Alertness: Awake/alert Behavior During Therapy: Anxious(tearful) Overall Cognitive Status: No family/caregiver present to determine baseline cognitive functioning Area of Impairment: Problem solving;Safety/judgement                         Safety/Judgement: Decreased awareness of safety;Decreased awareness of deficits    Problem Solving: Difficulty sequencing;Requires verbal cues;Requires tactile cues        General Comments      Exercises     Assessment/Plan    PT Assessment Patient needs continued PT services  PT Problem List Decreased strength;Decreased activity tolerance;Decreased mobility;Decreased balance;Decreased coordination;Decreased cognition;Decreased safety awareness       PT Treatment Interventions DME instruction;Gait training;Stair training;Therapeutic activities;Functional mobility training;Therapeutic exercise;Balance training;Neuromuscular re-education;Patient/family education    PT Goals (Current goals can be found in the Care Plan section)  Acute Rehab PT Goals Patient Stated Goal: to get stronger PT Goal Formulation: With patient Time For Goal Achievement: 05/25/17 Potential to Achieve Goals: Fair    Frequency Min 3X/week   Barriers to discharge        Co-evaluation               AM-PAC PT "6 Clicks" Daily Activity  Outcome Measure Difficulty turning over in bed (including adjusting bedclothes, sheets and blankets)?: A Lot Difficulty moving from lying on back to sitting on the side of the bed? : Unable Difficulty sitting down on and standing up from a chair with arms (e.g., wheelchair, bedside commode, etc,.)?: Unable Help needed moving to and from a bed to chair (including a wheelchair)?: A Lot Help needed walking in hospital room?: A Lot Help needed climbing 3-5 steps with a railing? : Total 6 Click Score: 9    End of Session Equipment Utilized During Treatment: Gait belt Activity Tolerance: Patient limited by fatigue Patient left: in chair;with call bell/phone within reach;with chair alarm set Nurse Communication: Mobility status PT Visit Diagnosis: Other abnormalities of gait and mobility (R26.89);Muscle weakness (generalized) (M62.81)    Time: 0160-1093 PT Time Calculation (min) (ACUTE ONLY): 22 min   Charges:   PT Evaluation $PT Eval  Moderate Complexity: 1 Mod     PT G Codes:   PT G-Codes **NOT FOR INPATIENT CLASS** Functional Assessment Tool Used: AM-PAC 6 Clicks Basic Mobility;Clinical judgement Functional Limitation: Mobility: Walking and moving around Mobility: Walking and Moving Around Current Status (A3557): At least 60 percent but less than 80 percent impaired, limited or restricted Mobility: Walking and Moving Around Goal Status [phone]): At least 20 percent but less than 40 percent impaired, limited or restricted    [facility], [place], DPT [place] 05/11/2017, 12:43 PM

## 2017-05-11 NOTE — Consult Note (Addendum)
[facility] Face-to-Face Psychiatry Consult   Reason for Consult:  Depression Referring Physician:  Dr. Tyrell Antonio Patient Identification: [name] MRN:  [id number] Principal Diagnosis: MDD (major depressive disorder), recurrent episode, moderate ([place]) Diagnosis:   Patient Active Problem List   Diagnosis Date Noted  . Dehydration [E86.0] 05/10/2017  . Weakness [R53.1] 05/10/2017  . Squamous cell lung cancer, left ([place]) [C34.92] 05/04/2017  . Encounter for antineoplastic chemotherapy [Z51.11] 05/04/2017  . Goals of care, counseling/discussion [Z71.89] 05/04/2017  . Stage III squamous cell carcinoma of left lung ([place]) [C34.92] 05/03/2017  . Chest congestion [R09.89] 04/03/2017  . Constipation [K59.00] 04/03/2017  . Adenoma of left adrenal gland [D35.02] 04/03/2017  . SIADH (syndrome of inappropriate ADH production) ([place]) [E22.2] 04/02/2017  . Lung mass [R91.8] 04/02/2017  . Elevated AST (SGOT) [R74.0] 04/02/2017  . Leukocytosis [D72.829] 04/02/2017  . Normocytic anemia [D64.9] 04/02/2017  . Cerumen impaction [H61.20] 11/19/2015  . Hip fracture requiring operative repair ([place]) [S72.009A] 01/09/2014  . Closed right hip fracture ([place]) [S72.001A] 01/09/2014  . Hyponatremia [E87.1] 01/09/2014  . Symptomatic cholelithiasis [K80.20] 03/20/2013  . Dilated bile duct [K83.8] 03/20/2013  . Abdominal aortic atherosclerosis ([place]) [I70.0]   . Hypertension [I10] 10/19/2011  . Tobacco abuse [Z72.0] 02/18/2009  . HLD (hyperlipidemia) [E78.5] 02/17/2009  . ANXIETY DISORDER [F41.1] 02/17/2009  . CAD (coronary artery disease) [I25.10] 02/17/2009  . CAROTID ARTERY OCCLUSION [I65.29] 02/17/2009  . CVA [I63.50] 02/17/2009    Total Time spent with patient: 1 hour  Subjective:   [name] is a 83 y.o. female patient admitted with hyponatremia in the setting of dehydration.  HPI:   Per chart review, patient has a history of lung cancer and is starting chemotherapy on Monday. She reports  decreased appetite and poor PO intake for weeks. Her sodium level was 125 on admission and is currently 127. Psychiatry is consulted for medication management for depression in the setting of hyponatremia. Home medications include Paxil 20 mg daily and Xanax 0.25 mg BID PRN. PMP indicates two prescriptions filled for Xanax and the last was filled on 12/14. Her prescribing doctor is Mikey Bussing.   On interview,  [name] reports that she has a history of mild depression but her mood acutely worsened in November when she was diagnosed with lung cancer. She reports frequent crying spells, poor appetite (started prior to hospitalization due to malaise) and poor sleep. She also reports anxiety secondary to worries about her health. She reports an improvement in her energy level since her last hospitalization. She enjoys watching television and playing the piano. She denies problems with her concentration. She reports that her mood was pretty good prior to hospitalization a month ago. Her PCP started Paxil 3 weeks ago. She denies a history of manic symptoms, SI, HI or AVH.  Past Psychiatric History: Depression  Risk to Self: Is patient at risk for suicide?: No Risk to Others:  None. Denies HI. Prior Inpatient Therapy:  Denies Prior Outpatient Therapy:  Her medications are prescribed by her PCP.   Past Medical History:  Past Medical History:  Diagnosis Date  . Abdominal aortic atherosclerosis ([place])   . CAD (coronary artery disease)   . Carotid artery occlusion   . Common bile duct dilation   . Gallstones   . Headache(784.0)   . Hiatal hernia   . HLD (hyperlipidemia)   . Myocardial infarction ([place])   . PONV (postoperative nausea and vomiting)   . Stroke [facility]) 2003    Past Surgical History:  Procedure Laterality Date  . ABDOMINAL HYSTERECTOMY    . ANKLE FRACTURE SURGERY    . APPENDECTOMY    . BLADDER SURGERY    . CHOLECYSTECTOMY N/A 05/05/2013   Procedure: LAPAROSCOPIC CHOLECYSTECTOMY  WITH INTRAOPERATIVE CHOLANGIOGRAM;  Surgeon: Ralene Ok, MD;  Location: [facility];  Service: General;  Laterality: N/A;  . HIP ARTHROPLASTY Right 01/10/2014   Procedure: RIGHT HIP HEMIARTHROPLASTY;  Surgeon: Johnn Hai, MD;  Location: [facility];  Service: Orthopedics;  Laterality: Right;  DEPUY AML LATERAL WITH MARK II  . ROTATOR CUFF REPAIR Left   . TONSILLECTOMY     Family History:  Family History  Problem Relation Age of Onset  . Stroke Mother   . Breast cancer Maternal Aunt   . Prostate cancer Maternal Grandfather    Family Psychiatric  History: Denies Social History:  Social History   Substance and Sexual Activity  Alcohol Use No     Social History   Substance and Sexual Activity  Drug Use No    Social History   Socioeconomic History  . Marital status: Widowed    Spouse name: None  . Number of children: 3  . Years of education: None  . Highest education level: None  Social Needs  . Financial resource strain: None  . Food insecurity - worry: None  . Food insecurity - inability: None  . Transportation needs - medical: None  . Transportation needs - non-medical: None  Occupational History  . Occupation: Retired  Tobacco Use  . Smoking status: Former Smoker    Types: Cigarettes    Last attempt to quit: 03/24/2017    Years since quitting: 0.1  . Smokeless tobacco: Never Used  Substance and Sexual Activity  . Alcohol use: No  . Drug use: No  . Sexual activity: None  Other Topics Concern  . None  Social History Narrative   The patient is married with 3 adult sons   Retired   4 cups of caffeine daily   Originally from [place]   03/20/2013         Additional Social History: She lives with her daughter. She recently live at home alone before her cancer diagnosis. She was married twice. Her former husband passed away from colon cancer. She has 3 children and 4 stepchildren that live in various states. She denies alcohol or illicit substance use. She quit smoking  1 month ago. She previously smoked 2 ppd since 83 y/o.     Allergies:   Allergies  Allergen Reactions  . Dextroamphetamine Sulfate Er Other (See Comments)    Euphoric feeling  . Other     Other reaction(s): Other (See Comments) Euphoric feeling    Labs:  Results for orders placed or performed during the hospital encounter of 05/10/17 (from the past 48 hour(s))  Basic metabolic panel     Status: Abnormal   Collection Time: 05/10/17  2:48 PM  Result Value Ref Range   Sodium 125 (L) 135 - 145 mmol/L   Potassium 3.6 3.5 - 5.1 mmol/L   Chloride 91 (L) 101 - 111 mmol/L   CO2 24 22 - 32 mmol/L   Glucose, Bld 177 (H) 65 - 99 mg/dL   BUN 27 (H) 6 - 20 mg/dL   Creatinine, Ser 1.02 (H) 0.44 - 1.00 mg/dL   Calcium 10.1 8.9 - 10.3 mg/dL   GFR calc non Af Amer 48 (L) >60 mL/min   GFR calc Af Amer 56 (L) >60 mL/min    Comment: (  NOTE) The eGFR has been calculated using the CKD EPI equation. This calculation has not been validated in all clinical situations. eGFR's persistently <60 mL/min signify possible Chronic Kidney Disease.    Anion gap 10 5 - 15  CBC     Status: Abnormal   Collection Time: 05/10/17  2:48 PM  Result Value Ref Range   WBC 10.1 4.0 - 10.5 K/uL   RBC 3.73 (L) 3.87 - 5.11 MIL/uL   Hemoglobin 10.5 (L) 12.0 - 15.0 g/dL   HCT 31.4 (L) 36.0 - 46.0 %   MCV 84.2 78.0 - 100.0 fL   MCH 28.2 26.0 - 34.0 pg   MCHC 33.4 30.0 - 36.0 g/dL   RDW 13.6 11.5 - 15.5 %   Platelets 355 150 - 400 K/uL  I-stat troponin, ED     Status: None   Collection Time: 05/10/17  3:07 PM  Result Value Ref Range   Troponin i, poc 0.00 0.00 - 0.08 ng/mL   Comment 3            Comment: Due to the release kinetics of cTnI, a negative result within the first hours of the onset of symptoms does not rule out myocardial infarction with certainty. If myocardial infarction is still suspected, repeat the test at appropriate intervals.   Magnesium     Status: None   Collection Time: 05/10/17  4:53 PM   Result Value Ref Range   Magnesium 1.8 1.7 - 2.4 mg/dL  CK     Status: Abnormal   Collection Time: 05/10/17  4:53 PM  Result Value Ref Range   Total CK 19 (L) 38 - 234 U/L  Osmolality     Status: None   Collection Time: 05/10/17  7:57 PM  Result Value Ref Range   Osmolality 275 275 - 295 mOsm/kg  Basic metabolic panel     Status: Abnormal   Collection Time: 05/11/17  7:04 AM  Result Value Ref Range   Sodium 127 (L) 135 - 145 mmol/L   Potassium 4.0 3.5 - 5.1 mmol/L   Chloride 98 (L) 101 - 111 mmol/L   CO2 23 22 - 32 mmol/L   Glucose, Bld 115 (H) 65 - 99 mg/dL   BUN 20 6 - 20 mg/dL   Creatinine, Ser 0.75 0.44 - 1.00 mg/dL   Calcium 9.3 8.9 - 10.3 mg/dL   GFR calc non Af Amer >60 >60 mL/min   GFR calc Af Amer >60 >60 mL/min    Comment: (NOTE) The eGFR has been calculated using the CKD EPI equation. This calculation has not been validated in all clinical situations. eGFR's persistently <60 mL/min signify possible Chronic Kidney Disease.    Anion gap 6 5 - 15  CBC     Status: Abnormal   Collection Time: 05/11/17  7:04 AM  Result Value Ref Range   WBC 11.4 (H) 4.0 - 10.5 K/uL   RBC 3.50 (L) 3.87 - 5.11 MIL/uL   Hemoglobin 9.8 (L) 12.0 - 15.0 g/dL   HCT 30.0 (L) 36.0 - 46.0 %   MCV 85.7 78.0 - 100.0 fL   MCH 28.0 26.0 - 34.0 pg   MCHC 32.7 30.0 - 36.0 g/dL   RDW 14.2 11.5 - 15.5 %   Platelets 332 150 - 400 K/uL    Current Facility-Administered Medications  Medication Dose Route Frequency Provider Last Rate Last Dose  . 0.9 %  sodium chloride infusion   Intravenous Continuous Regalado, Belkys A, MD 100 mL/hr at  05/11/17 1116    . ALPRAZolam Duanne Moron) tablet 0.25 mg  0.25 mg Oral BID PRN Phillips Grout, MD      . aspirin EC tablet 81 mg  81 mg Oral Daily Derrill Kay A, MD   81 mg at 05/11/17 1037  . feeding supplement (ENSURE ENLIVE) (ENSURE ENLIVE) liquid 237 mL  237 mL Oral BID BM Derrill Kay A, MD   237 mL at 05/11/17 1046  . guaiFENesin (MUCINEX) 12 hr tablet 1,200  mg  1,200 mg Oral BID Derrill Kay A, MD   1,200 mg at 05/11/17 1037  . ipratropium-albuterol (DUONEB) 0.5-2.5 (3) MG/3ML nebulizer solution 3 mL  3 mL Nebulization TID Phillips Grout, MD   3 mL at 05/11/17 0934  . ipratropium-albuterol (DUONEB) 0.5-2.5 (3) MG/3ML nebulizer solution 3 mL  3 mL Nebulization Q6H PRN Phillips Grout, MD      . multivitamin with minerals tablet 1 tablet  1 tablet Oral QHS Phillips Grout, MD   1 tablet at 05/10/17 2305  . nitroGLYCERIN (NITROSTAT) SL tablet 0.4 mg  0.4 mg Sublingual Q5 min PRN Phillips Grout, MD      . prochlorperazine (COMPAZINE) tablet 10 mg  10 mg Oral Q6H PRN Phillips Grout, MD        Musculoskeletal: Strength & Muscle Tone: decreased due to physical deconditioning. Gait & Station: UTA since lying in bed. Patient leans: N/A  Psychiatric Specialty Exam: Physical Exam  Nursing note and vitals reviewed. Constitutional: She is oriented to person, place, and time. She appears well-developed and well-nourished.  HENT:  Head: Normocephalic and atraumatic.  Neck: Normal range of motion.  Respiratory: Effort normal.  Musculoskeletal: Normal range of motion.  Neurological: She is alert and oriented to person, place, and time.  Skin: No rash noted.  Psychiatric: Her behavior is normal. Judgment and thought content normal. Cognition and memory are normal. She exhibits a depressed mood.    Review of Systems  Constitutional: Negative for chills and fever.  Respiratory: Positive for cough.   Cardiovascular: Negative for chest pain.  Gastrointestinal: Positive for constipation. Negative for abdominal pain, diarrhea, nausea and vomiting.  Psychiatric/Behavioral: Positive for depression. Negative for hallucinations, substance abuse and suicidal ideas. The patient is nervous/anxious and has insomnia.     Blood pressure (!) 158/66, pulse 82, temperature 97.7 F (36.5 C), temperature source Oral, resp. rate 18, height '5\' 1"'$  (1.549 m), SpO2 96 %.Body  mass index is 23.68 kg/m.  General Appearance: Well Groomed, elderly, Caucasian female with short hair and wearing a hospital gown who is lying in bed. NAD.   Eye Contact:  Good  Speech:  Clear and Coherent and Normal Rate  Volume:  Normal  Mood:  Anxious and Depressed  Affect:  Congruent and Tearful  Thought Process:  Goal Directed and Linear  Orientation:  Full (Time, Place, and Person)  Thought Content:  Logical  Suicidal Thoughts:  No  Homicidal Thoughts:  No  Memory:  Immediate;   Good Recent;   Good Remote;   Good  Judgement:  Good  Insight:  Good  Psychomotor Activity:  Normal  Concentration:  Concentration: Good and Attention Span: Good  Recall:  Good  Fund of Knowledge:  Good  Language:  Good  Akathisia:  No  Handed:  Right  AIMS (if indicated):   N/A  Assets:  Communication Skills Desire for Improvement Financial Resources/Insurance Housing Social Support  ADL's:  Intact  Cognition:  WNL  Sleep:  Poor   Assessment: [name] is a 83 y.o. female who was admitted with hyponatremia in the setting of dehydration. Patient reports frequent crying spells, poor appetite (started prior to hospitalization due to malaise) and poor sleep for the past month since she was diagnosed with lung cancer. She was started on Paxil 3 weeks ago by her PCP. Hyponatremia is thought to be secondary to SIADH caused by lung cancer but SSRIs can certainly contribute to hyponatremia as well. She may benefit from starting Remeron which is less likely than SSRIs to cause hyponatremia.   Treatment Plan Summary: -Start Remeron 15 mg qhs for anxiety, depression, poor appetite and sleep. -Remeron is less likely to contribute to hyponatremia compared to SSRIs.  -Psychiatry will sign off patient at this time. Please consult psychiatry again as needed.   Disposition: No evidence of imminent risk to self or others at present.   Patient does not meet criteria for psychiatric inpatient  admission.  Faythe Dingwall, DO 05/11/2017 11:26 AM

## 2017-05-11 NOTE — Progress Notes (Addendum)
PROGRESS NOTE    [name]  PYP:[id number] DOB: May 26, 1928 DOA: 05/10/2017 PCP: Leonard Downing, MD    Brief Narrative: [name] is a 83 y.o. female with medical history significant ofrecent diagnosis of lung cancer starting chemo on Monday has had decreased appetite and po intake for weeks.  Comes in with generalized weakness.  No fevers.  No n/v/d.  Just no appetitie. No pain anywhere.  No cough.  Pt has had SIADH with na levels hanging around 130.  Pt referred for admission for hyponatremia and dehydration with na level of 125.    Assessment & Plan:   Principal Problem:   Hyponatremia Active Problems:   CAD (coronary artery disease)   SIADH (syndrome of inappropriate ADH production) (HCC)   Squamous cell lung cancer, left (HCC)   Dehydration   Weakness  1-Hyponatremia, acute on chronic. She has history of SIADH.  SIADH superimpose dehydration.  Continue with IV fluids, repeat Bmet this afternoon. If worsening hyponatremia will stop fluids.   2-Depresion; psych consulted.   3-Squamous cell lung cancer, left ; follow with Dr Mohamed/       DVT prophylaxis: SCD Code Status: Full code Family Communication: care discussed with patient.  Disposition Plan: to be determine  Consultants:   Psych    Procedures:   none   Antimicrobials: none   Subjective: She is crying, she is feeling sad. She has not been eating.  Poor oral intake, unable to keep her self hydrated.   Objective: Vitals:   05/10/17 2130 05/11/17 0434 05/11/17 0815 05/11/17 0934  BP: (!) 159/62 (!) 164/59 (!) 158/66   Pulse: 75 70 82   Resp: 17 16 18    Temp: 98 F (36.7 C) 98.3 F (36.8 C) 97.7 F (36.5 C)   TempSrc: Oral Oral Oral   SpO2: 97% 98% 95% 96%  Height: 5\' 1"  (1.549 m)       Intake/Output Summary (Last 24 hours) at 05/11/2017 1445 Last data filed at 05/11/2017 0436 Gross per 24 hour  Intake 0 ml  Output 475 ml  Net -475 ml   There were no vitals  filed for this visit.  Examination:  General exam: Appears calm and comfortable  Respiratory system: Clear to auscultation. Respiratory effort normal. Cardiovascular system: S1 & S2 heard, RRR. No JVD, murmurs, rubs, gallops or clicks. No pedal edema. Gastrointestinal system: Abdomen is nondistended, soft and nontender. No organomegaly or masses felt. Normal bowel sounds heard. Central nervous system: Alert and oriented. No focal neurological deficits. Extremities: Symmetric 5 x 5 power. Skin: No rashes, lesions or ulcers   Data Reviewed: I have personally reviewed following labs and imaging studies  CBC: Recent Labs  Lab 05/10/17 1448 05/11/17 0704  WBC 10.1 11.4*  HGB 10.5* 9.8*  HCT 31.4* 30.0*  MCV 84.2 85.7  PLT 355 245   Basic Metabolic Panel: Recent Labs  Lab 05/10/17 1448 05/10/17 1653 05/11/17 0704  NA 125*  --  127*  K 3.6  --  4.0  CL 91*  --  98*  CO2 24  --  23  GLUCOSE 177*  --  115*  BUN 27*  --  20  CREATININE 1.02*  --  0.75  CALCIUM 10.1  --  9.3  MG  --  1.8  --    GFR: Estimated Creatinine Clearance: 38.1 mL/min (by C-G formula based on SCr of 0.75 mg/dL). Liver Function Tests: No results for input(s): AST, ALT, ALKPHOS, BILITOT, PROT, ALBUMIN in  the last 168 hours. No results for input(s): LIPASE, AMYLASE in the last 168 hours. No results for input(s): AMMONIA in the last 168 hours. Coagulation Profile: No results for input(s): INR, PROTIME in the last 168 hours. Cardiac Enzymes: Recent Labs  Lab 05/10/17 1653  CKTOTAL 19*   BNP (last 3 results) No results for input(s): PROBNP in the last 8760 hours. HbA1C: No results for input(s): HGBA1C in the last 72 hours. CBG: No results for input(s): GLUCAP in the last 168 hours. Lipid Profile: No results for input(s): CHOL, HDL, LDLCALC, TRIG, CHOLHDL, LDLDIRECT in the last 72 hours. Thyroid Function Tests: No results for input(s): TSH, T4TOTAL, FREET4, T3FREE, THYROIDAB in the last 72  hours. Anemia Panel: No results for input(s): VITAMINB12, FOLATE, FERRITIN, TIBC, IRON, RETICCTPCT in the last 72 hours. Sepsis Labs: No results for input(s): PROCALCITON, LATICACIDVEN in the last 168 hours.  No results found for this or any previous visit (from the past 240 hour(s)).       Radiology Studies: Dg Chest 2 View  Result Date: 05/10/2017 CLINICAL DATA:  Shortness of breath and productive cough. EXAM: CHEST  2 VIEW COMPARISON:  Head CT dated April 27, 2017. Chest x-ray dated April 04, 2017. FINDINGS: The cardiomediastinal silhouette is normal in size. Normal pulmonary vascularity. Unchanged left upper lobe mass. No focal consolidation, pleural effusion, or pneumothorax. No acute osseous abnormality. IMPRESSION: 1. Unchanged left upper lobe lung mass. No active cardiopulmonary disease. Electronically Signed   By: Titus Dubin M.D.   On: 05/10/2017 15:46   Ct Head Wo Contrast  Result Date: 05/10/2017 CLINICAL DATA:  Weakness and difficulty ambulating beginning today. History of stroke. EXAM: CT HEAD WITHOUT CONTRAST TECHNIQUE: Contiguous axial images were obtained from the base of the skull through the vertex without intravenous contrast. COMPARISON:  04/27/2017 FINDINGS: Brain: Ventricles and cisterns are normal. Remaining CSF spaces are unremarkable. There is moderate chronic ischemic microvascular disease. There is a small old left frontal infarct. There is no mass, mass effect, shift of midline structures or acute hemorrhage. No evidence of acute infarction. Vascular: No hyperdense vessel or unexpected calcification. Skull: Normal. Negative for fracture or focal lesion. Sinuses/Orbits: No acute finding. Other: None. IMPRESSION: No acute intracranial findings. Moderate chronic ischemic microvascular disease. Small old left frontal infarct. Electronically Signed   By: Marin Olp M.D.   On: 05/10/2017 18:02        Scheduled Meds: . aspirin EC  81 mg Oral Daily  .  feeding supplement (ENSURE ENLIVE)  237 mL Oral BID BM  . guaiFENesin  1,200 mg Oral BID  . ipratropium-albuterol  3 mL Nebulization TID  . multivitamin with minerals  1 tablet Oral QHS   Continuous Infusions: . sodium chloride 100 mL/hr at 05/11/17 1116     LOS: 0 days    Time spent: 35 minutes.     Elmarie Shiley, MD [facility] Pager [phone]  If 7PM-7AM, please contact night-coverage [email or web address] Password [email or web address] 05/11/2017, 2:45 PM

## 2017-05-11 NOTE — Progress Notes (Signed)
[facility]  Patient Status: Active (receiving services up to time of hospitalization)  [facility] is providing the following services: RN  If patient discharges after hours, please call [phone].   [name] 05/11/2017, 12:03 PM

## 2017-05-12 DIAGNOSIS — F331 Major depressive disorder, recurrent, moderate: Secondary | ICD-10-CM

## 2017-05-12 LAB — BASIC METABOLIC PANEL
Anion gap: 7 (ref 5–15)
BUN: 12 mg/dL (ref 6–20)
CHLORIDE: 96 mmol/L — AB (ref 101–111)
CO2: 26 mmol/L (ref 22–32)
CREATININE: 0.82 mg/dL (ref 0.44–1.00)
Calcium: 9.9 mg/dL (ref 8.9–10.3)
GFR calc non Af Amer: >60 mL/min (ref >60)
Glucose, Bld: 127 mg/dL — ABNORMAL HIGH (ref 65–99)
POTASSIUM: 4.1 mmol/L (ref 3.5–5.1)
Sodium: 129 mmol/L — ABNORMAL LOW (ref 135–145)

## 2017-05-12 MED ORDER — SODIUM CHLORIDE 0.9 % IV SOLN
INTRAVENOUS | Status: DC
  Administered 2017-05-12: 15:00:00 via INTRAVENOUS
  Administered 2017-05-13 – 2017-05-14 (×2): 100 mL/h via INTRAVENOUS

## 2017-05-12 MED ORDER — IPRATROPIUM-ALBUTEROL 0.5-2.5 (3) MG/3ML IN SOLN
3.0000 mL | Freq: Two times a day (BID) | RESPIRATORY_TRACT | Status: DC
  Administered 2017-05-12 – 2017-05-13 (×2): 3 mL via RESPIRATORY_TRACT
  Filled 2017-05-12 (×2): qty 3

## 2017-05-12 NOTE — Progress Notes (Signed)
PROGRESS NOTE    [name]  DJS:[id number] DOB: Jun 22, 1928 DOA: 05/10/2017 PCP: Leonard Downing, MD    Brief Narrative: [name] is a 83 y.o. female with medical history significant ofrecent diagnosis of lung cancer starting chemo on Monday has had decreased appetite and po intake for weeks.  Comes in with generalized weakness.  No fevers.  No n/v/d.  Just no appetitie. No pain anywhere.  No cough.  Pt has had SIADH with na levels hanging around 130.  Pt referred for admission for hyponatremia and dehydration with na level of 125.    Assessment & Plan:   Principal Problem:   MDD (major depressive disorder), recurrent episode, moderate (HCC) Active Problems:   CAD (coronary artery disease)   Hyponatremia   SIADH (syndrome of inappropriate ADH production) (HCC)   Squamous cell lung cancer, left (HCC)   Dehydration   Weakness  1-Hyponatremia, acute on chronic. She has history of SIADH. Sodium baseline 130 SIADH superimpose dehydration.  Continue with IV fluid today for 10 hours. Sodium improving but patient with poor oral intake.   2-Depresion; psych consulted. Psych recommended Remeron 15 mg at HS. I started patient on 7.5 mg at HS. Patient was sleepy this am. She report that she was abel to sleep last night. sha has been able to sleep at night for a while now. She wants to continue with medications for now. If she get too sleepy will have to discontinue med.s  3-Squamous cell lung cancer, left ; follow with Dr Mohamed/   4-FTT; poor oral intake, could be related to chemo vs depression..  Not eating. Continue with IV fluids for now. Hopefully Remeron helps     DVT prophylaxis: SCD Code Status: Full code Family Communication: care discussed with patient.  Disposition Plan: to be determine  Consultants:   Psych    Procedures:   none   Antimicrobials: none   Subjective: She was sleepy this am, but wake up answer questions. Report she was able  to sleep last night, something that she was not able to do in a long time. She report poor oral intake. Didn't eat breakfast.  She does not have appetite.   Objective: Vitals:   05/11/17 2020 05/11/17 2158 05/12/17 0823 05/12/17 0918  BP:  (!) 192/69  (!) 165/79  Pulse:  91  (!) 101  Resp:  18  18  Temp:  98.2 F (36.8 C)  98 F (36.7 C)  TempSrc:  Oral  Oral  SpO2: 98% 96% 93% 95%  Height:        Intake/Output Summary (Last 24 hours) at 05/12/2017 1415 Last data filed at 05/12/2017 1030 Gross per 24 hour  Intake 360 ml  Output 1650 ml  Net -1290 ml   Filed Weights    Examination:  General exam; sleepy , NAD Respiratory system: Normal respiratory effort, CTA Cardiovascular system: S 1, S 2 RRR Gastrointestinal system: BS present, soft, nt Central nervous system: sleepy, answer questions, non focal.  Extremities: Symmetric power.     Data Reviewed: I have personally reviewed following labs and imaging studies  CBC: Recent Labs  Lab 05/10/17 1448 05/11/17 0704  WBC 10.1 11.4*  HGB 10.5* 9.8*  HCT 31.4* 30.0*  MCV 84.2 85.7  PLT 355 885   Basic Metabolic Panel: Recent Labs  Lab 05/10/17 1448 05/10/17 1653 05/11/17 0704 05/11/17 1618 05/12/17 1143  NA 125*  --  127* 128* 129*  K 3.6  --  4.0 4.4  4.1  CL 91*  --  98* 98* 96*  CO2 24  --  23 23 26   GLUCOSE 177*  --  115* 109* 127*  BUN 27*  --  20 18 12   CREATININE 1.02*  --  0.75 0.71 0.82  CALCIUM 10.1  --  9.3 9.4 9.9  MG  --  1.8  --   --   --    GFR: Estimated Creatinine Clearance: 37.2 mL/min (by C-G formula based on SCr of 0.82 mg/dL). Liver Function Tests: No results for input(s): AST, ALT, ALKPHOS, BILITOT, PROT, ALBUMIN in the last 168 hours. No results for input(s): LIPASE, AMYLASE in the last 168 hours. No results for input(s): AMMONIA in the last 168 hours. Coagulation Profile: No results for input(s): INR, PROTIME in the last 168 hours. Cardiac Enzymes: Recent Labs  Lab  05/10/17 1653  CKTOTAL 19*   BNP (last 3 results) No results for input(s): PROBNP in the last 8760 hours. HbA1C: No results for input(s): HGBA1C in the last 72 hours. CBG: No results for input(s): GLUCAP in the last 168 hours. Lipid Profile: No results for input(s): CHOL, HDL, LDLCALC, TRIG, CHOLHDL, LDLDIRECT in the last 72 hours. Thyroid Function Tests: No results for input(s): TSH, T4TOTAL, FREET4, T3FREE, THYROIDAB in the last 72 hours. Anemia Panel: No results for input(s): VITAMINB12, FOLATE, FERRITIN, TIBC, IRON, RETICCTPCT in the last 72 hours. Sepsis Labs: No results for input(s): PROCALCITON, LATICACIDVEN in the last 168 hours.  No results found for this or any previous visit (from the past 240 hour(s)).       Radiology Studies: Dg Chest 2 View  Result Date: 05/10/2017 CLINICAL DATA:  Shortness of breath and productive cough. EXAM: CHEST  2 VIEW COMPARISON:  Head CT dated April 27, 2017. Chest x-ray dated April 04, 2017. FINDINGS: The cardiomediastinal silhouette is normal in size. Normal pulmonary vascularity. Unchanged left upper lobe mass. No focal consolidation, pleural effusion, or pneumothorax. No acute osseous abnormality. IMPRESSION: 1. Unchanged left upper lobe lung mass. No active cardiopulmonary disease. Electronically Signed   By: Titus Dubin M.D.   On: 05/10/2017 15:46   Ct Head Wo Contrast  Result Date: 05/10/2017 CLINICAL DATA:  Weakness and difficulty ambulating beginning today. History of stroke. EXAM: CT HEAD WITHOUT CONTRAST TECHNIQUE: Contiguous axial images were obtained from the base of the skull through the vertex without intravenous contrast. COMPARISON:  04/27/2017 FINDINGS: Brain: Ventricles and cisterns are normal. Remaining CSF spaces are unremarkable. There is moderate chronic ischemic microvascular disease. There is a small old left frontal infarct. There is no mass, mass effect, shift of midline structures or acute hemorrhage. No  evidence of acute infarction. Vascular: No hyperdense vessel or unexpected calcification. Skull: Normal. Negative for fracture or focal lesion. Sinuses/Orbits: No acute finding. Other: None. IMPRESSION: No acute intracranial findings. Moderate chronic ischemic microvascular disease. Small old left frontal infarct. Electronically Signed   By: Marin Olp M.D.   On: 05/10/2017 18:02        Scheduled Meds: . aspirin EC  81 mg Oral Daily  . feeding supplement (ENSURE ENLIVE)  237 mL Oral BID BM  . guaiFENesin  1,200 mg Oral BID  . ipratropium-albuterol  3 mL Nebulization BID  . mirtazapine  7.5 mg Oral QHS  . multivitamin with minerals  1 tablet Oral QHS   Continuous Infusions: . sodium chloride       LOS: 1 day    Time spent: 35 minutes.     Mirca Yale  Desiree Lucy, MD [facility] Pager [phone]  If 7PM-7AM, please contact night-coverage [email or web address] Password [email or web address] 05/12/2017, 2:15 PM

## 2017-05-13 LAB — BASIC METABOLIC PANEL
ANION GAP: 7 (ref 5–15)
BUN: 16 mg/dL (ref 6–20)
CHLORIDE: 96 mmol/L — AB (ref 101–111)
CO2: 26 mmol/L (ref 22–32)
Calcium: 9.9 mg/dL (ref 8.9–10.3)
Creatinine, Ser: 0.74 mg/dL (ref 0.44–1.00)
GFR calc Af Amer: >60 mL/min (ref >60)
GFR calc non Af Amer: >60 mL/min (ref >60)
Glucose, Bld: 125 mg/dL — ABNORMAL HIGH (ref 65–99)
POTASSIUM: 3.9 mmol/L (ref 3.5–5.1)
SODIUM: 129 mmol/L — AB (ref 135–145)

## 2017-05-13 LAB — CBC
HCT: 33.2 % — ABNORMAL LOW (ref 36.0–46.0)
HEMOGLOBIN: 10.8 g/dL — AB (ref 12.0–15.0)
MCH: 27.9 pg (ref 26.0–34.0)
MCHC: 32.5 g/dL (ref 30.0–36.0)
MCV: 85.8 fL (ref 78.0–100.0)
Platelets: 400 10*3/uL (ref 150–400)
RBC: 3.87 MIL/uL (ref 3.87–5.11)
RDW: 14.1 % (ref 11.5–15.5)
WBC: 13.1 10*3/uL — ABNORMAL HIGH (ref 4.0–10.5)

## 2017-05-13 MED ORDER — DOCUSATE SODIUM 100 MG PO CAPS
100.0000 mg | ORAL_CAPSULE | Freq: Two times a day (BID) | ORAL | Status: DC
  Administered 2017-05-13 – 2017-05-14 (×3): 100 mg via ORAL
  Filled 2017-05-13 (×3): qty 1

## 2017-05-13 MED ORDER — ALPRAZOLAM 0.25 MG PO TABS
0.1250 mg | ORAL_TABLET | Freq: Two times a day (BID) | ORAL | Status: DC | PRN
  Administered 2017-05-13: 0.125 mg via ORAL
  Filled 2017-05-13: qty 1

## 2017-05-13 MED ORDER — POLYETHYLENE GLYCOL 3350 17 G PO PACK
17.0000 g | PACK | Freq: Every day | ORAL | Status: DC
  Administered 2017-05-13 – 2017-05-14 (×2): 17 g via ORAL
  Filled 2017-05-13 (×2): qty 1

## 2017-05-13 NOTE — NC FL2 (Signed)
[place] LEVEL OF CARE SCREENING TOOL     IDENTIFICATION  Patient Name: [name] Birthdate: December 13, 1928 Sex: female Admission Date (Current Location): 05/10/2017  [facility] and [place] Number:  Herbalist and Address:  [facility], [place] [address], [place], [place] [address]      Provider Number: [id number]  Attending Physician Name and Address:  Lavina Hamman, MD  Relative Name and Phone Number:       Current Level of Care: Hospital Recommended Level of Care: [place] Prior Approval Number:    Date Approved/Denied:   PASRR Number: [id number]  Discharge Plan: SNF    Current Diagnoses: Patient Active Problem List   Diagnosis Date Noted  . MDD (major depressive disorder), recurrent episode, moderate ([place]) 05/11/2017  . Dehydration 05/10/2017  . Weakness 05/10/2017  . Squamous cell lung cancer, left ([place]) 05/04/2017  . Encounter for antineoplastic chemotherapy 05/04/2017  . Goals of care, counseling/discussion 05/04/2017  . Stage III squamous cell carcinoma of left lung ([place]) 05/03/2017  . Chest congestion 04/03/2017  . Constipation 04/03/2017  . Adenoma of left adrenal gland 04/03/2017  . SIADH (syndrome of inappropriate ADH production) ([place]) 04/02/2017  . Lung mass 04/02/2017  . Elevated AST (SGOT) 04/02/2017  . Leukocytosis 04/02/2017  . Normocytic anemia 04/02/2017  . Cerumen impaction 11/19/2015  . Hip fracture requiring operative repair ([place]) 01/09/2014  . Closed right hip fracture ([place]) 01/09/2014  . Hyponatremia 01/09/2014  . Symptomatic cholelithiasis 03/20/2013  . Dilated bile duct 03/20/2013  . Abdominal aortic atherosclerosis ([place])   . Hypertension 10/19/2011  . Tobacco abuse 02/18/2009  . HLD (hyperlipidemia) 02/17/2009  . ANXIETY DISORDER 02/17/2009  . CAD (coronary artery disease) 02/17/2009  . CAROTID ARTERY OCCLUSION 02/17/2009  . CVA 02/17/2009    Orientation  RESPIRATION BLADDER Height & Weight     Self, Time, Situation, Place  Normal External catheter(Placed 12/20) Weight: (bed scale broken) Height:  5\' 1"  (154.9 cm)  BEHAVIORAL SYMPTOMS/MOOD NEUROLOGICAL BOWEL NUTRITION STATUS      Continent (Please see d/c summary)  AMBULATORY STATUS COMMUNICATION OF NEEDS Skin   Extensive Assist Verbally Normal                       Personal Care Assistance Level of Assistance  Bathing, Feeding, Dressing Bathing Assistance: Maximum assistance Feeding assistance: Limited assistance Dressing Assistance: Maximum assistance     Functional Limitations Info  Sight, Hearing, Speech Sight Info: Adequate Hearing Info: Adequate Speech Info: Adequate    SPECIAL CARE FACTORS FREQUENCY  PT (By licensed PT), OT (By licensed OT)     PT Frequency: 3x OT Frequency: 3x            Contractures Contractures Info: Not present    Additional Factors Info  Code Status, Allergies Code Status Info: Full Code Allergies Info: DEXTROAMPHETAMINE SULFATE ER, OTHER            Current Medications (05/13/2017):  This is the current hospital active medication list Current Facility-Administered Medications  Medication Dose Route Frequency Provider Last Rate Last Dose  . 0.9 %  sodium chloride infusion   Intravenous Continuous Regalado, Belkys A, MD 100 mL/hr at 05/12/17 1502    . ALPRAZolam Duanne Moron) tablet 0.125 mg  0.125 mg Oral BID PRN Lavina Hamman, MD   0.125 mg at 05/13/17 1053  . aspirin EC tablet 81 mg  81 mg Oral Daily Phillips Grout, MD  81 mg at 05/13/17 1041  . docusate sodium (COLACE) capsule 100 mg  100 mg Oral BID Lavina Hamman, MD   100 mg at 05/13/17 1053  . feeding supplement (ENSURE ENLIVE) (ENSURE ENLIVE) liquid 237 mL  237 mL Oral BID BM Derrill Kay A, MD   237 mL at 05/13/17 1054  . guaiFENesin (MUCINEX) 12 hr tablet 1,200 mg  1,200 mg Oral BID Derrill Kay A, MD   1,200 mg at 05/13/17 1042  . ipratropium-albuterol (DUONEB) 0.5-2.5  (3) MG/3ML nebulizer solution 3 mL  3 mL Nebulization Q6H PRN Derrill Kay A, MD      . mirtazapine (REMERON) tablet 7.5 mg  7.5 mg Oral QHS Regalado, Belkys A, MD   7.5 mg at 05/12/17 2254  . multivitamin with minerals tablet 1 tablet  1 tablet Oral QHS Phillips Grout, MD   1 tablet at 05/12/17 2254  . nitroGLYCERIN (NITROSTAT) SL tablet 0.4 mg  0.4 mg Sublingual Q5 min PRN Derrill Kay A, MD      . polyethylene glycol (MIRALAX / GLYCOLAX) packet 17 g  17 g Oral Daily Lavina Hamman, MD   17 g at 05/13/17 1052  . prochlorperazine (COMPAZINE) tablet 10 mg  10 mg Oral Q6H PRN Phillips Grout, MD         Discharge Medications: Please see discharge summary for a list of discharge medications.  Relevant Imaging Results:  Relevant Lab Results:   Additional Information SSN: [id number]  Eileen Stanford, LCSW

## 2017-05-13 NOTE — Clinical Social Work Note (Signed)
Clinical Social Work Assessment  Patient Details  Name: [name] MRN: [id number] Date of Birth: 1928/06/27  Date of referral:  05/13/17               Reason for consult:  Facility Placement                Permission sought to share information with:  Family Supports Permission granted to share information::  Yes, Verbal Permission Granted  Name::     April  Agency::     Relationship::  Grandaughter  Contact Information:  [phone]  Housing/Transportation Living arrangements for the past 2 months:  Single Family Home Source of Information:  Patient Patient Interpreter Needed:  None Criminal Activity/Legal Involvement Pertinent to Current Situation/Hospitalization:  No - Comment as needed Significant Relationships:  Adult Children, Other Family Members Lives with:  Self Do you feel safe going back to the place where you live?  No Need for family participation in patient care:     Care giving concerns:  Pt declines any concerns at this time. Pt's grandaughter and two daughter present at bedside. Family expresses concern for placement.    Social Worker assessment / plan:  CSW spoke with pt and pt's family at bedside. Pt previously lived at home alone. Pt is agreeable to SNF placement at d/c. Pt's grandaughter states do not send her to [facility]. Pt's family (and pt is agreeable) prefers placement at [facility] due to location. CSW will reach out to facility and follow up with pt and pt's family.   Employment status:  Retired Nurse, adult PT Recommendations:  [place] / Referral to community resources:  [place]  Patient/Family's Response to care:  Pt verbalized understanding of CSW role and expressed appreciation for support. Pt denies any concern regarding pt care at this time.   Patient/Family's Understanding of and Emotional Response to Diagnosis, Current Treatment, and Prognosis:  Pt  understanding and realistic regarding physical limitations. Pt understands the need for SNF placement at d/c. Pt agreeable to SNF placement at d/c, at this time. Pt's responses emotionally appropriate during conversation with CSW. Pt denies any concern regarding treatment plan at this time. CSW will continue to provide support and facilitate d/c needs.   Emotional Assessment Appearance:  Appears stated age Attitude/Demeanor/Rapport:  (Patient was appropriate.) Affect (typically observed):  Accepting, Appropriate Orientation:  Oriented to Self, Oriented to Place, Oriented to  Time, Oriented to Situation Alcohol / Substance use:  Not Applicable Psych involvement (Current and /or in the community):  No (Comment)  Discharge Needs  Concerns to be addressed:  Care Coordination Readmission within the last 30 days:  No Current discharge risk:  Dependent with Mobility Barriers to Discharge:  Continued Medical Work up   [employer], LCSW 05/13/2017, 12:50 PM

## 2017-05-13 NOTE — Progress Notes (Addendum)
PROGRESS NOTE    [name]  HFW:[id number] DOB: [birth year] DOA: 05/10/2017 PCP: Leonard Downing, MD    Brief Narrative: [name] is a 83 y.o. female with medical history significant ofrecent diagnosis of lung cancer starting chemo on Monday has had decreased appetite and po intake for weeks.  Comes in with generalized weakness.  No fevers.  No n/v/d.  Just no appetitie. No pain anywhere.  No cough.  Pt has had SIADH with na levels hanging around 130.  Pt referred for admission for hyponatremia and dehydration with na level of 125.    Assessment & Plan:   Principal Problem:   MDD (major depressive disorder), recurrent episode, moderate (HCC) Active Problems:   CAD (coronary artery disease)   Hyponatremia   SIADH (syndrome of inappropriate ADH production) (HCC)   Squamous cell lung cancer, left (HCC)   Dehydration   Weakness  1-Hyponatremia, acute on chronic. She has history of SIADH. Sodium baseline 130 SIADH superimpose dehydration.  Continue with IV fluid today. Sodium improving but patient with poor oral intake.   2-Depresion; psych consulted. Psych recommended Remeron 15 mg at HS. I started patient on 7.5 mg at HS. Anxiety still remains an issue due to her recent diagnosis as well as future chemoradiation.  Daughter at bedside.  Patient concern regarding possible inability to get radiation if her weakness does not get better. I recommended to cut the dose of Xanax in half and take it twice a day as needed at least.  Patient has been avoiding it due to excessive sleepiness.  3-Squamous cell lung cancer, left ; follow with Dr Mohamed/  Patient is already scheduled for chemoradiation as an outpatient.  4-FTT; poor oral intake, could be related to chemo vs depression..  Not eating. Continue with IV fluids for now. Hopefully Remeron helps   DVT prophylaxis: SCD Code Status: Full code Family Communication: care discussed with patient.  Disposition Plan:  Likely SNF tomorrow  Consultants:   Psych    Procedures:   none   Antimicrobials: none   Subjective: Awake, anxious, eating and tolerating oral diet.  No nausea no vomiting.  Denies any acute pain.  Objective: Vitals:   05/12/17 2037 05/12/17 2107 05/13/17 0556 05/13/17 0930  BP:  (!) 168/70 (!) 186/79 134/63  Pulse:  89 96 (!) 101  Resp:  18 19 20   Temp:  98.1 F (36.7 C) 98.6 F (37 C) 98.2 F (36.8 C)  TempSrc:  Oral Oral Oral  SpO2: 94% 92% 93% 94%  Height:        Intake/Output Summary (Last 24 hours) at 05/13/2017 1625 Last data filed at 05/13/2017 0900 Gross per 24 hour  Intake 2816.67 ml  Output 1750 ml  Net 1066.67 ml   Filed Weights    Examination:  General exam; awake, oriented x3, NAD Respiratory system: Normal respiratory effort, CTA Cardiovascular system: S 1, S 2 RRR Gastrointestinal system: BS present, soft, nt Central nervous system: No focal deficit, answer questions Extremities: Symmetric power.     Data Reviewed: I have personally reviewed following labs and imaging studies  CBC: Recent Labs  Lab 05/10/17 1448 05/11/17 0704 05/13/17 0551  WBC 10.1 11.4* 13.1*  HGB 10.5* 9.8* 10.8*  HCT 31.4* 30.0* 33.2*  MCV 84.2 85.7 85.8  PLT 355 332 027   Basic Metabolic Panel: Recent Labs  Lab 05/10/17 1448 05/10/17 1653 05/11/17 0704 05/11/17 1618 05/12/17 1143 05/13/17 0551  NA 125*  --  127* 128* 129*  129*  K 3.6  --  4.0 4.4 4.1 3.9  CL 91*  --  98* 98* 96* 96*  CO2 24  --  23 23 26 26   GLUCOSE 177*  --  115* 109* 127* 125*  BUN 27*  --  20 18 12 16   CREATININE 1.02*  --  0.75 0.71 0.82 0.74  CALCIUM 10.1  --  9.3 9.4 9.9 9.9  MG  --  1.8  --   --   --   --    GFR: Estimated Creatinine Clearance: 38.1 mL/min (by C-G formula based on SCr of 0.74 mg/dL). Liver Function Tests: No results for input(s): AST, ALT, ALKPHOS, BILITOT, PROT, ALBUMIN in the last 168 hours. No results for input(s): LIPASE, AMYLASE in the last  168 hours. No results for input(s): AMMONIA in the last 168 hours. Coagulation Profile: No results for input(s): INR, PROTIME in the last 168 hours. Cardiac Enzymes: Recent Labs  Lab 05/10/17 1653  CKTOTAL 19*   BNP (last 3 results) No results for input(s): PROBNP in the last 8760 hours. HbA1C: No results for input(s): HGBA1C in the last 72 hours. CBG: No results for input(s): GLUCAP in the last 168 hours. Lipid Profile: No results for input(s): CHOL, HDL, LDLCALC, TRIG, CHOLHDL, LDLDIRECT in the last 72 hours. Thyroid Function Tests: No results for input(s): TSH, T4TOTAL, FREET4, T3FREE, THYROIDAB in the last 72 hours. Anemia Panel: No results for input(s): VITAMINB12, FOLATE, FERRITIN, TIBC, IRON, RETICCTPCT in the last 72 hours. Sepsis Labs: No results for input(s): PROCALCITON, LATICACIDVEN in the last 168 hours.  No results found for this or any previous visit (from the past 240 hour(s)).       Radiology Studies: No results found.      Scheduled Meds: . aspirin EC  81 mg Oral Daily  . docusate sodium  100 mg Oral BID  . feeding supplement (ENSURE ENLIVE)  237 mL Oral BID BM  . guaiFENesin  1,200 mg Oral BID  . mirtazapine  7.5 mg Oral QHS  . multivitamin with minerals  1 tablet Oral QHS  . polyethylene glycol  17 g Oral Daily   Continuous Infusions: . sodium chloride 100 mL/hr at 05/12/17 1502     LOS: 2 days    Time spent: 35 minutes.     Author:  Berle Mull, MD [facility] Pager: [phone] 05/13/2017 4:28 PM     If 7PM-7AM, please contact night-coverage [email or web address] Password [facility] 05/13/2017, 4:25 PM

## 2017-05-14 ENCOUNTER — Ambulatory Visit: Admission: RE | Admit: 2017-05-14 | Payer: Medicare Other | Source: Ambulatory Visit | Admitting: Radiation Oncology

## 2017-05-14 ENCOUNTER — Inpatient Hospital Stay (HOSPITAL_COMMUNITY): Payer: Medicare Other

## 2017-05-14 LAB — URINALYSIS, ROUTINE W REFLEX MICROSCOPIC
BILIRUBIN URINE: NEGATIVE
GLUCOSE, UA: NEGATIVE mg/dL
HGB URINE DIPSTICK: NEGATIVE
KETONES UR: NEGATIVE mg/dL
LEUKOCYTES UA: NEGATIVE
Nitrite: POSITIVE — AB
PROTEIN: NEGATIVE mg/dL
Specific Gravity, Urine: 1.009 (ref 1.005–1.030)
pH: 7 (ref 5.0–8.0)

## 2017-05-14 LAB — BASIC METABOLIC PANEL
ANION GAP: 9 (ref 5–15)
BUN: 17 mg/dL (ref 6–20)
CO2: 24 mmol/L (ref 22–32)
Calcium: 10.2 mg/dL (ref 8.9–10.3)
Chloride: 98 mmol/L — ABNORMAL LOW (ref 101–111)
Creatinine, Ser: 0.76 mg/dL (ref 0.44–1.00)
GLUCOSE: 123 mg/dL — AB (ref 65–99)
POTASSIUM: 4 mmol/L (ref 3.5–5.1)
Sodium: 131 mmol/L — ABNORMAL LOW (ref 135–145)

## 2017-05-14 LAB — CBC
HEMATOCRIT: 32.5 % — AB (ref 36.0–46.0)
HEMOGLOBIN: 10.5 g/dL — AB (ref 12.0–15.0)
MCH: 27.8 pg (ref 26.0–34.0)
MCHC: 32.3 g/dL (ref 30.0–36.0)
MCV: 86 fL (ref 78.0–100.0)
Platelets: 370 10*3/uL (ref 150–400)
RBC: 3.78 MIL/uL — AB (ref 3.87–5.11)
RDW: 14.1 % (ref 11.5–15.5)
WBC: 13.5 10*3/uL — ABNORMAL HIGH (ref 4.0–10.5)

## 2017-05-14 LAB — OSMOLALITY, URINE: Osmolality, Ur: 362 mOsm/kg (ref 300–900)

## 2017-05-14 LAB — MAGNESIUM: MAGNESIUM: 1.9 mg/dL (ref 1.7–2.4)

## 2017-05-14 LAB — SODIUM, URINE, RANDOM: SODIUM UR: 100 mmol/L

## 2017-05-14 MED ORDER — ALPRAZOLAM 0.25 MG PO TABS: 0.1250 mg | ORAL_TABLET | Freq: Two times a day (BID) | ORAL | 0 refills | Status: DC | PRN

## 2017-05-14 MED ORDER — MIRTAZAPINE 7.5 MG PO TABS: 7.5000 mg | ORAL_TABLET | Freq: Every day | ORAL | 0 refills | Status: DC

## 2017-05-14 NOTE — Clinical Social Work Placement (Signed)
   CLINICAL SOCIAL WORK PLACEMENT  NOTE 05/14/17 - DISCHARGED TO [facility]IA AMBULANCE  Date:  05/14/2017  Patient Details  Name: [name] MRN: [id number] Date of Birth: [birth year]  Clinical Social Work is seeking post-discharge placement for this patient at the [place] level of care (*CSW will initial, date and re-position this form in  chart as items are completed):  No(Patient's granddaughter expressed facility preference)   Patient/family provided with [place] Work Department's list of facilities offering this level of care within the geographic area requested by the patient (or if unable, by the patient's family).  Yes   Patient/family informed of their freedom to choose among providers that offer the needed level of care, that participate in Medicare, Medicaid or managed care program needed by the patient, have an available bed and are willing to accept the patient.  No   Patient/family informed of [facility]'s ownership interest in [facility] and [facility], as well as of the fact that they are under no obligation to receive care at these facilities.  PASRR submitted to EDS on       PASRR number received on       Existing PASRR number confirmed on 05/13/17     FL2 transmitted to all facilities in geographic area requested by pt/family on 05/13/17     FL2 transmitted to all facilities within larger geographic area on 05/14/17     Patient informed that his/her managed care company has contracts with or will negotiate with certain facilities, including the following:        Yes   Patient/family informed of bed offers received.  Patient chooses bed at [facility]     Physician recommends and patient chooses bed at      Patient to be transferred to [facility] on 05/14/17.  Patient to be transferred to facility by Ambulance     Patient family notified on 05/14/17 of transfer.  Name of family member notified:   Granddaughter Suzi Roots by phone and at the bedside     PHYSICIAN       Additional Comment:    _______________________________________________ Sable Feil, LCSW 05/14/2017, 2:37 PM

## 2017-05-14 NOTE — Progress Notes (Signed)
Report called and given to RN at [facility].

## 2017-05-14 NOTE — Progress Notes (Signed)
[name] to be D/C'd Skilled nursing facility per MD order.  Discussed prescriptions and follow up appointments with the patient. Prescriptions given to patient, medication list explained in detail. Pt verbalized understanding.  Allergies as of 05/14/2017      Reactions   Dextroamphetamine Sulfate Er Other (See Comments)   Euphoric feeling   Other    Other reaction(s): Other (See Comments) Euphoric feeling      Medication List    STOP taking these medications   PARoxetine 20 MG tablet Commonly known as:  PAXIL     TAKE these medications   ALPRAZolam 0.25 MG tablet Commonly known as:  XANAX Take 0.5 tablets (0.125 mg total) by mouth 2 (two) times daily as needed for anxiety. What changed:  how much to take   aspirin 81 MG EC tablet Take 81 mg by mouth daily.   CALCIUM + D PO Take 1 tablet by mouth daily.   feeding supplement (ENSURE ENLIVE) Liqd Take 237 mLs 2 (two) times daily between meals by mouth.   GLUCOSAMINE CHONDR COMPLEX PO Take 1 capsule by mouth daily.   guaiFENesin 600 MG 12 hr tablet Commonly known as:  MUCINEX Take 2 tablets (1,200 mg total) 2 (two) times daily by mouth.   lisinopril 20 MG tablet Commonly known as:  PRINIVIL,ZESTRIL Take 1 tablet (20 mg total) daily by mouth.   mirtazapine 7.5 MG tablet Commonly known as:  REMERON Take 1 tablet (7.5 mg total) by mouth at bedtime.   multivitamin with minerals Tabs tablet Take 1 tablet by mouth at bedtime.   nitroGLYCERIN 0.4 MG SL tablet Commonly known as:  NITROSTAT Place 0.4 mg under the tongue every 5 (five) minutes as needed for chest pain.   polyethylene glycol packet Commonly known as:  MIRALAX / GLYCOLAX Take 17 g daily as needed by mouth.   prochlorperazine 10 MG tablet Commonly known as:  COMPAZINE Take 1 tablet (10 mg total) by mouth every 6 (six) hours as needed for nausea or vomiting.       Vitals:   05/14/17 0857 05/14/17 1541  BP: (!) 153/69 (!) 162/67  Pulse: 95  87  Resp: 19 18  Temp: 98.5 F (36.9 C) 98.1 F (36.7 C)  SpO2: 92% 95%    Skin clean, dry and intact without evidence of skin break down, no evidence of skin tears noted. IV catheter discontinued intact. Site without signs and symptoms of complications. Dressing and pressure applied. Pt denies pain at this time. No complaints noted.  An After Visit Summary was printed and given to the patient. Patient escorted via stretcher, and D/C to SNF via [facility].  Dixie Dials RN, BSN

## 2017-05-14 NOTE — Discharge Summary (Addendum)
[facility] Discharge Summary   Patient: [name] XBM:[id number]   PCP: Leonard Downing, MD DOB: December 25, 1928   Date of admission: 05/10/2017   Date of discharge:  05/14/2017    Discharge Diagnoses:  Principal Problem:   MDD (major depressive disorder), recurrent episode, moderate ([place]) Active Problems:   CAD (coronary artery disease)   Hyponatremia   SIADH (syndrome of inappropriate ADH production) ([place])   Squamous cell lung cancer, left (H[facility])   Dehydration   Weakness   Admitted From: home Disposition:  SNF  Recommendations for Outpatient Follow-up:  1. Please follow up iwht PCP in 1 week and continue chemo radiation as outpatietn    Follow-up Information    Leonard Downing, MD. Schedule an appointment as soon as possible for a visit in 1 week(s).   Specialty:  Family Medicine Contact information: [place] [address] [phone]          Diet recommendation: cardiac diet  Activity: The patient is advised to gradually reintroduce usual activities.  Discharge Condition: good  Code Status: full code, if MD determines her condition is irreversible she would like to be DNR  History of present illness: As per the H and P dictated on admission, " COLLEN HOSTLER is a 83 y.o. female with medical history significant ofrecent diagnosis of lung cancer starting chemo on Monday has had decreased appetite and po intake for weeks.  Comes in with generalized weakness.  No fevers.  No n/v/d.  Just no appetitie. No pain anywhere.  No cough.  Pt has had SIADH with na levels hanging around 130.  Pt referred for admission for hyponatremia and dehydration with na level of 125."  Hospital Course:  Summary of her active problems in the hospital is as following. 1-Hyponatremia, acute on chronic. She has history of SIADH. Sodium baseline 130 SIADH superimpose dehydration.  Resolved with IV fluid.  2-Depresion; psych consulted. Psych  recommended Remeron 15 mg at HS.  started patient on 7.5 mg at HS. Anxiety still remains an issue due to her recent diagnosis as well as future chemoradiation.  Daughter at bedside.  Patient concern regarding possible inability to get radiation if her weakness does not get better. She was not taking xanax as it was making her sleep a lot. I recommended to cut the dose of Xanax in half and take it twice a day as needed at least.  Patient has been avoiding it due to excessive sleepiness.  3-Squamous cell lung cancer, left ; follow with Dr Mohamed/  Patient is already scheduled for chemoradiation as an outpatient.  4-FTT; poor oral intake, could be related to chemo vs depression.Marland Kitchen  Hopefully Remeron helps  Appetite better  All other chronic medical condition were stable during the hospitalization.  Patient was seen by physical therapy, who recommended SNF, which was arranged by Education officer, museum and case Freight forwarder. On the day of the discharge the patient's vitals were stable, and no other acute medical condition were reported by patient. the patient was felt safe to be discharge at home with family.  Procedures and Results:  none   Consultations:  Psychiatry  DISCHARGE MEDICATION: Allergies as of 05/14/2017      Reactions   Dextroamphetamine Sulfate Er Other (See Comments)   Euphoric feeling   Other    Other reaction(s): Other (See Comments) Euphoric feeling      Medication List    STOP taking these medications   PARoxetine 20 MG tablet Commonly known  as:  PAXIL     TAKE these medications   ALPRAZolam 0.25 MG tablet Commonly known as:  XANAX Take 0.5 tablets (0.125 mg total) by mouth 2 (two) times daily as needed for anxiety. What changed:  how much to take   aspirin 81 MG EC tablet Take 81 mg by mouth daily.   CALCIUM + D PO Take 1 tablet by mouth daily.   feeding supplement (ENSURE ENLIVE) Liqd Take 237 mLs 2 (two) times daily between meals by mouth.   GLUCOSAMINE  CHONDR COMPLEX PO Take 1 capsule by mouth daily.   guaiFENesin 600 MG 12 hr tablet Commonly known as:  MUCINEX Take 2 tablets (1,200 mg total) 2 (two) times daily by mouth.   lisinopril 20 MG tablet Commonly known as:  PRINIVIL,ZESTRIL Take 1 tablet (20 mg total) daily by mouth.   mirtazapine 7.5 MG tablet Commonly known as:  REMERON Take 1 tablet (7.5 mg total) by mouth at bedtime.   multivitamin with minerals Tabs tablet Take 1 tablet by mouth at bedtime.   nitroGLYCERIN 0.4 MG SL tablet Commonly known as:  NITROSTAT Place 0.4 mg under the tongue every 5 (five) minutes as needed for chest pain.   polyethylene glycol packet Commonly known as:  MIRALAX / GLYCOLAX Take 17 g daily as needed by mouth.   prochlorperazine 10 MG tablet Commonly known as:  COMPAZINE Take 1 tablet (10 mg total) by mouth every 6 (six) hours as needed for nausea or vomiting.      Allergies  Allergen Reactions  . Dextroamphetamine Sulfate Er Other (See Comments)    Euphoric feeling  . Other     Other reaction(s): Other (See Comments) Euphoric feeling   Discharge Instructions    Diet - low sodium heart healthy   Complete by:  As directed    Discharge instructions   Complete by:  As directed    It is important that you read following instructions as well as go over your medication list with RN to help you understand your care after this hospitalization.  Discharge Instructions: Please follow-up with PCP in one week  Please request your primary care physician to go over all Hospital Tests and Procedure/Radiological results at the follow up,  Please get all Hospital records sent to your PCP by signing hospital release before you go home.   Do not take more than prescribed Pain, Sleep and Anxiety Medications. You were cared for by a hospitalist during your hospital stay. If you have any questions about your discharge medications or the care you received while you were in the hospital after you  are discharged, you can call the unit and ask to speak with the hospitalist on call if the hospitalist that took care of you is not available.  Once you are discharged, your primary care physician will handle any further medical issues. Please note that NO REFILLS for any discharge medications will be authorized once you are discharged, as it is imperative that you return to your primary care physician (or establish a relationship with a primary care physician if you do not have one) for your aftercare needs so that they can reassess your need for medications and monitor your lab values. You Must read complete instructions/literature along with all the possible adverse reactions/side effects for all the Medicines you take and that have been prescribed to you. Take any new Medicines after you have completely understood and accept all the possible adverse reactions/side effects. Wear Seat belts while driving.  If you have smoked or chewed Tobacco in the last 2 yrs please stop smoking and/or stop any Recreational drug use.   Increase activity slowly   Complete by:  As directed      Discharge Exam: Filed Weights   Vitals:   05/14/17 0357 05/14/17 0857  BP: (!) 167/79 (!) 153/69  Pulse: (!) 106 95  Resp: 19 19  Temp: 98.8 F (37.1 C) 98.5 F (36.9 C)  SpO2: 92% 92%   General: Appear in no distress, no Rash; Oral Mucosa moist. Cardiovascular: S1 and S2 Present, no Murmur, no JVD Respiratory: Bilateral Air entry present and Clear to Auscultation, no Crackles, no wheezes Abdomen: Bowel Sound present, Soft and no tenderness Extremities: no Pedal edema, no calf tenderness Neurology: Grossly no focal neuro deficit.  The results of significant diagnostics from this hospitalization (including imaging, microbiology, ancillary and laboratory) are listed below for reference.    Significant Diagnostic Studies: Dg Chest 2 View  Result Date: 05/14/2017 CLINICAL DATA:  Lung carcinoma EXAM: CHEST  2  VIEW COMPARISON:  Chest radiograph May 10, 2017 and PET-CT April 27, 2017. FINDINGS: The mass in the left apex region with volume loss remain stable. Lungs elsewhere clear. Heart size and pulmonary vascularity are within normal limits. No adenopathy demonstrated by radiography. There is aortic atherosclerosis. There is calcification in the left carotid artery. No evident bone lesions. IMPRESSION: Mass with volume loss left apex region, grossly stable. No new opacity. Stable cardiac silhouette. There is aortic atherosclerosis as well as calcification in the left carotid artery. Aortic Atherosclerosis (ICD10-I70.0). Electronically Signed   By: Lowella Grip III M.D.   On: 05/14/2017 08:37   Dg Chest 2 View  Result Date: 05/10/2017 CLINICAL DATA:  Shortness of breath and productive cough. EXAM: CHEST  2 VIEW COMPARISON:  Head CT dated April 27, 2017. Chest x-ray dated April 04, 2017. FINDINGS: The cardiomediastinal silhouette is normal in size. Normal pulmonary vascularity. Unchanged left upper lobe mass. No focal consolidation, pleural effusion, or pneumothorax. No acute osseous abnormality. IMPRESSION: 1. Unchanged left upper lobe lung mass. No active cardiopulmonary disease. Electronically Signed   By: Titus Dubin M.D.   On: 05/10/2017 15:46   Ct Head Wo Contrast  Result Date: 05/10/2017 CLINICAL DATA:  Weakness and difficulty ambulating beginning today. History of stroke. EXAM: CT HEAD WITHOUT CONTRAST TECHNIQUE: Contiguous axial images were obtained from the base of the skull through the vertex without intravenous contrast. COMPARISON:  04/27/2017 FINDINGS: Brain: Ventricles and cisterns are normal. Remaining CSF spaces are unremarkable. There is moderate chronic ischemic microvascular disease. There is a small old left frontal infarct. There is no mass, mass effect, shift of midline structures or acute hemorrhage. No evidence of acute infarction. Vascular: No hyperdense vessel or  unexpected calcification. Skull: Normal. Negative for fracture or focal lesion. Sinuses/Orbits: No acute finding. Other: None. IMPRESSION: No acute intracranial findings. Moderate chronic ischemic microvascular disease. Small old left frontal infarct. Electronically Signed   By: Marin Olp M.D.   On: 05/10/2017 18:02   Ct Head W Wo Contrast  Result Date: 04/27/2017 CLINICAL DATA:  Non small cell lung cancer. Headaches. Left upper lobe mass. EXAM: CT HEAD WITHOUT AND WITH CONTRAST TECHNIQUE: Contiguous axial images were obtained from the base of the skull through the vertex without and with intravenous contrast CONTRAST:  36mL ISOVUE-300 IOPAMIDOL (ISOVUE-300) INJECTION 61% COMPARISON:  CT head without contrast 10/14/2003. PET scan of the same day. FINDINGS: Brain: Advanced atrophy and white matter disease  is present bilaterally. The postcontrast images demonstrate no pathologic enhancement. Basal ganglia are intact. No focal cortical lesions are present. The ventricles are proportionate to the degree of atrophy. The brainstem and cerebellum are within normal limits. The postcontrast images demonstrate no pathologic enhancement. Vascular: Atherosclerotic calcifications are present within the cavernous internal carotid arteries bilaterally. Skull: Calvarium is intact. No focal lytic or blastic lesions are present. Sinuses/Orbits: A polyp or mucous retention cyst is present within the right ethmoid air cells. The paranasal sinuses and mastoid air cells are otherwise clear. Bilateral lens replacements are present. Globes and orbits are otherwise within normal limits. IMPRESSION: 1. No evidence for metastatic disease the brain. 2. Advanced atrophy and white matter disease likely reflects the sequela of chronic microvascular ischemia. 3. Atherosclerosis. Electronically Signed   By: San Morelle M.D.   On: 04/27/2017 16:04   Nm Pet Image Initial (pi) Skull Base To Thigh  Result Date: 04/27/2017 CLINICAL  DATA:  Initial treatment strategy for non-small cell lung cancer in the left upper lobe EXAM: NUCLEAR MEDICINE PET SKULL BASE TO THIGH TECHNIQUE: 6.6 mCi F-18 FDG was injected intravenously. Full-ring PET imaging was performed from the skull base to thigh after the radiotracer. CT data was obtained and used for attenuation correction and anatomic localization. FASTING BLOOD GLUCOSE:  Value: 110 mg/dl COMPARISON:  Multiple exams, including 03/30/2017 CT chest FINDINGS: NECK No hypermetabolic lymph nodes in the neck. CHEST 7.5 by 6.0 cm left apical Pancoast tumor with irregular margins, maximum SUV 24.3 possible mediastinal involvement abutting an up to 180 degree arc of the left subclavian artery. The mass also abuts the margin of the aortic arch over an 83 degree marginal arc and 2.7 cm of longitudinal contact. Questionable involvement of the left paraspinal soft tissues as on the original CT, but without vertebral or definite rib invasion at this time. An AP window lymph node measures 1.1 cm in short axis on image 60/4 and has a maximum SUV of 4.2. Left suprahilar adenopathy measured 1.2 cm on the 03/30/2017 CT scan, and has low-level metabolic activity with maximum SUV 4.2. Background mediastinal blood pool activity 2.2. ABDOMEN/PELVIS Low-density fullness of the left adrenal gland, possibly from a small adrenal adenoma, 7 Hounsfield units, without significant abnormal hypermetabolic activity. The liver, spleen, and pancreas demonstrate no abnormal activity. No hypermetabolic adenopathy in the abdomen or pelvis. Aortoiliac atherosclerotic vascular disease. Sigmoid colon diverticulosis. SKELETON No focal hypermetabolic activity to suggest skeletal metastasis. Right hip hemiarthroplasty. The mild sclerosis along both sacroiliac joints. Degenerative facet arthropathy in the lower lumbar spine. IMPRESSION: 1. 7.5 cm left apical Pancoast tumor contacts the margins of the left subclavian artery and the aortic arch, and  is highly hypermetabolic with maximum SUV 24.3. There may be some early in extension into the left paraspinal adipose tissue but no vertebral/bony invasion. 2. Mildly enlarged left suprahilar and AP window lymph nodes each have a maximum SUV of 4.2, which is enough above background mediastinal blood pool activity that these nodes are probably metastatic. 3. No findings of metastatic disease to the neck, abdomen/pelvis, or skeleton. 4. Other imaging findings of potential clinical significance: Possible small left adrenal adenoma. Aortic Atherosclerosis (ICD10-I70.0). Sigmoid colon diverticulosis. Mild chronic bilateral sacroiliitis. Facet arthropathy in the lower lumbar spine. Right hip hemiarthroplasty. Electronically Signed   By: Van Clines M.D.   On: 04/27/2017 16:00    Microbiology: No results found for this or any previous visit (from the past 240 hour(s)).   Labs: CBC: Recent Labs  Lab 05/10/17 1448 05/11/17 0704 05/13/17 0551 05/14/17 0502  WBC 10.1 11.4* 13.1* 13.5*  HGB 10.5* 9.8* 10.8* 10.5*  HCT 31.4* 30.0* 33.2* 32.5*  MCV 84.2 85.7 85.8 86.0  PLT 355 332 400 435   Basic Metabolic Panel: Recent Labs  Lab 05/10/17 1653 05/11/17 0704 05/11/17 1618 05/12/17 1143 05/13/17 0551 05/14/17 0502  NA  --  127* 128* 129* 129* 131*  K  --  4.0 4.4 4.1 3.9 4.0  CL  --  98* 98* 96* 96* 98*  CO2  --  23 23 26 26 24   GLUCOSE  --  115* 109* 127* 125* 123*  BUN  --  20 18 12 16 17   CREATININE  --  0.75 0.71 0.82 0.74 0.76  CALCIUM  --  9.3 9.4 9.9 9.9 10.2  MG 1.8  --   --   --   --  1.9   Liver Function Tests: No results for input(s): AST, ALT, ALKPHOS, BILITOT, PROT, ALBUMIN in the last 168 hours. No results for input(s): LIPASE, AMYLASE in the last 168 hours. No results for input(s): AMMONIA in the last 168 hours. Cardiac Enzymes: Recent Labs  Lab 05/10/17 1653  CKTOTAL 19*   BNP (last 3 results) No results for input(s): BNP in the last 8760 hours. CBG: No  results for input(s): GLUCAP in the last 168 hours. Time spent: 35 minutes  Signed:  Berle Mull  [facility]  05/14/2017  , 11:43 AM

## 2017-05-14 NOTE — Progress Notes (Signed)
Physical Therapy Treatment Patient Details Name: [name] MRN: [id number] DOB: 06-12-1928 Today's Date: 05/14/2017    History of Present Illness Pt is an 83 y/o female admitted secondary to generalized weakness and found to be hyponatremic. Pt with a recent diagnosis of lung cancer and was planned to start chemotherapy on Monday (12/24). PMH including but not limited to CAD, HTN, hx of CVA and MI.    PT Comments    Pt very fatigued today but was able to take a few steps with a RW before sitting in chair. Pt concerned about POC going forward and trying to talk through with someone on phone. Continue to encourage OOB as tolerated. PT will continue to follow.    Follow Up Recommendations  SNF;Supervision/Assistance - 24 hour     Equipment Recommendations  None recommended by PT    Recommendations for Other Services       Precautions / Restrictions Precautions Precautions: Fall Restrictions Weight Bearing Restrictions: No    Mobility  Bed Mobility Overal bed mobility: Needs Assistance Bed Mobility: Supine to Sit     Supine to sit: Mod assist     General bed mobility comments: mod A to roll towards left and bring legs off bed. Pt able to grasp therapist's hand and pull self into sitting with min A. Increased time needed for pt to scoot self to EOB.   Transfers Overall transfer level: Needs assistance Equipment used: Rolling walker (2 wheeled) Transfers: Sit to/from Stand Sit to Stand: Mod assist;+2 physical assistance         General transfer comment: pt rose to squatted position that she maintained with ambulation. Mod A +2 for power up and support once standing  Ambulation/Gait Ambulation/Gait assistance: Mod assist;+2 physical assistance Ambulation Distance (Feet): 3 Feet Assistive device: Rolling walker (2 wheeled) Gait Pattern/deviations: Trunk flexed;Shuffle Gait velocity: decreased Gait velocity interpretation: <1.8 ft/sec, indicative of risk for  recurrent falls General Gait Details: pt had great difficulty lifting feet, wt shifting facilitated at hips. Pt with increased trunk flexion but without stamina to be able to assumer erect posture. After attempt, pt very fatigued and needed to sit   Stairs            Wheelchair Mobility    Modified Rankin (Stroke Patients Only)       Balance Overall balance assessment: Needs assistance Sitting-balance support: Feet supported Sitting balance-Leahy Scale: Fair Sitting balance - Comments: pt unable to reposition hips in sitting or scoot self back in chair due to poor balance and increased fatigue/ decreased strength   Standing balance support: During functional activity;Bilateral upper extremity supported Standing balance-Leahy Scale: Poor Standing balance comment: heavy reliance on UE support                            Cognition Arousal/Alertness: Awake/alert Behavior During Therapy: WFL for tasks assessed/performed Overall Cognitive Status: No family/caregiver present to determine baseline cognitive functioning Area of Impairment: Problem solving;Safety/judgement                             Problem Solving: Difficulty sequencing;Requires verbal cues;Requires tactile cues General Comments: pt appropriate but with minimal verbalization      Exercises      General Comments        Pertinent Vitals/Pain Pain Assessment: No/denies pain    Home Living  Prior Function            PT Goals (current goals can now be found in the care plan section) Acute Rehab PT Goals Patient Stated Goal: to get stronger PT Goal Formulation: With patient Time For Goal Achievement: 05/25/17 Potential to Achieve Goals: Fair Progress towards PT goals: Progressing toward goals    Frequency    Min 2X/week      PT Plan Current plan remains appropriate;Frequency needs to be updated    Co-evaluation              AM-PAC  PT "6 Clicks" Daily Activity  Outcome Measure  Difficulty turning over in bed (including adjusting bedclothes, sheets and blankets)?: Unable Difficulty moving from lying on back to sitting on the side of the bed? : Unable Difficulty sitting down on and standing up from a chair with arms (e.g., wheelchair, bedside commode, etc,.)?: Unable Help needed moving to and from a bed to chair (including a wheelchair)?: A Lot Help needed walking in hospital room?: A Lot Help needed climbing 3-5 steps with a railing? : Total 6 Click Score: 8    End of Session Equipment Utilized During Treatment: Gait belt Activity Tolerance: Patient limited by fatigue Patient left: in chair;with call bell/phone within reach;with chair alarm set Nurse Communication: Mobility status PT Visit Diagnosis: Other abnormalities of gait and mobility (R26.89);Muscle weakness (generalized) (M62.81)     Time: 3435-6861 PT Time Calculation (min) (ACUTE ONLY): 18 min  Charges:  $Therapeutic Activity: 8-22 mins                    G Codes:       Leighton Roach, PT  Acute Rehab Services  [place] 05/14/2017, 11:45 AM

## 2017-05-16 ENCOUNTER — Ambulatory Visit: Payer: Medicare Other

## 2017-05-16 ENCOUNTER — Telehealth: Payer: Self-pay | Admitting: *Deleted

## 2017-05-16 ENCOUNTER — Ambulatory Visit
Admission: RE | Admit: 2017-05-16 | Discharge: 2017-05-16 | Disposition: A | Discharge status: Home / Self Care | Payer: Medicare Other | Source: Ambulatory Visit | Attending: Radiation Oncology | Admitting: Radiation Oncology

## 2017-05-16 DIAGNOSIS — I6529 Occlusion and stenosis of unspecified carotid artery: Secondary | ICD-10-CM | POA: Diagnosis not present

## 2017-05-16 DIAGNOSIS — I2510 Atherosclerotic heart disease of native coronary artery without angina pectoris: Secondary | ICD-10-CM | POA: Diagnosis not present

## 2017-05-16 DIAGNOSIS — R599 Enlarged lymph nodes, unspecified: Secondary | ICD-10-CM | POA: Diagnosis not present

## 2017-05-16 DIAGNOSIS — F17210 Nicotine dependence, cigarettes, uncomplicated: Secondary | ICD-10-CM | POA: Diagnosis not present

## 2017-05-16 DIAGNOSIS — Z79899 Other long term (current) drug therapy: Secondary | ICD-10-CM | POA: Diagnosis not present

## 2017-05-16 DIAGNOSIS — Z9221 Personal history of antineoplastic chemotherapy: Secondary | ICD-10-CM | POA: Diagnosis not present

## 2017-05-16 DIAGNOSIS — Z803 Family history of malignant neoplasm of breast: Secondary | ICD-10-CM | POA: Diagnosis not present

## 2017-05-16 DIAGNOSIS — C3412 Malignant neoplasm of upper lobe, left bronchus or lung: Secondary | ICD-10-CM | POA: Diagnosis not present

## 2017-05-16 DIAGNOSIS — E785 Hyperlipidemia, unspecified: Secondary | ICD-10-CM | POA: Diagnosis not present

## 2017-05-16 DIAGNOSIS — Z7982 Long term (current) use of aspirin: Secondary | ICD-10-CM | POA: Diagnosis not present

## 2017-05-16 DIAGNOSIS — I252 Old myocardial infarction: Secondary | ICD-10-CM | POA: Diagnosis not present

## 2017-05-16 DIAGNOSIS — K449 Diaphragmatic hernia without obstruction or gangrene: Secondary | ICD-10-CM | POA: Diagnosis not present

## 2017-05-16 DIAGNOSIS — Z510 Encounter for antineoplastic radiation therapy: Secondary | ICD-10-CM | POA: Diagnosis not present

## 2017-05-16 DIAGNOSIS — E222 Syndrome of inappropriate secretion of antidiuretic hormone: Secondary | ICD-10-CM | POA: Diagnosis not present

## 2017-05-16 DIAGNOSIS — I700 Atherosclerosis of aorta: Secondary | ICD-10-CM | POA: Diagnosis not present

## 2017-05-16 DIAGNOSIS — Z8673 Personal history of transient ischemic attack (TIA), and cerebral infarction without residual deficits: Secondary | ICD-10-CM | POA: Diagnosis not present

## 2017-05-16 DIAGNOSIS — Z8042 Family history of malignant neoplasm of prostate: Secondary | ICD-10-CM | POA: Diagnosis not present

## 2017-05-16 NOTE — Telephone Encounter (Signed)
[place] with scheduler Almyra Free, to see if she got patients schedule to start radiation today at 3:64m, ?,Yes, they will have here her at 330pm today they got the faxed schedule ,called and spoke with melanie RT therapist 11:46 AM'

## 2017-05-16 NOTE — Telephone Encounter (Signed)
Patient appt for 3:30pm  Not here,called [name] spoke with Marylyn Ishihara the trasnportation guy he is bringing her here now, [phone] is his number should be here 15 minutes, 3:59 PM

## 2017-05-17 ENCOUNTER — Ambulatory Visit
Admission: RE | Admit: 2017-05-17 | Discharge: 2017-05-17 | Disposition: A | Discharge status: Home / Self Care | Payer: Medicare Other | Source: Ambulatory Visit | Attending: Radiation Oncology | Admitting: Radiation Oncology

## 2017-05-17 DIAGNOSIS — C3412 Malignant neoplasm of upper lobe, left bronchus or lung: Secondary | ICD-10-CM | POA: Diagnosis not present

## 2017-05-18 ENCOUNTER — Ambulatory Visit
Admission: RE | Admit: 2017-05-18 | Discharge: 2017-05-18 | Disposition: A | Discharge status: Home / Self Care | Payer: Medicare Other | Source: Ambulatory Visit | Attending: Radiation Oncology | Admitting: Radiation Oncology

## 2017-05-18 DIAGNOSIS — C3492 Malignant neoplasm of unspecified part of left bronchus or lung: Secondary | ICD-10-CM

## 2017-05-18 DIAGNOSIS — C3412 Malignant neoplasm of upper lobe, left bronchus or lung: Secondary | ICD-10-CM | POA: Diagnosis not present

## 2017-05-18 MED ORDER — SONAFINE EX EMUL
1.0000 "application " | Freq: Two times a day (BID) | CUTANEOUS | Status: DC
  Administered 2017-05-18: 1 via TOPICAL

## 2017-05-21 ENCOUNTER — Telehealth: Payer: Self-pay | Admitting: Medical Oncology

## 2017-05-21 ENCOUNTER — Other Ambulatory Visit: Payer: Self-pay | Admitting: Internal Medicine

## 2017-05-21 ENCOUNTER — Ambulatory Visit (HOSPITAL_BASED_OUTPATIENT_CLINIC_OR_DEPARTMENT_OTHER): Payer: Medicare Other

## 2017-05-21 ENCOUNTER — Ambulatory Visit
Admission: RE | Admit: 2017-05-21 | Discharge: 2017-05-21 | Disposition: A | Discharge status: Home / Self Care | Payer: Medicare Other | Source: Ambulatory Visit | Attending: Radiation Oncology | Admitting: Radiation Oncology

## 2017-05-21 ENCOUNTER — Other Ambulatory Visit (HOSPITAL_BASED_OUTPATIENT_CLINIC_OR_DEPARTMENT_OTHER): Payer: Medicare Other

## 2017-05-21 VITALS — BP 168/66 | HR 87 | Temp 98.6°F | Resp 18 | Ht 61.0 in | Wt 117.0 lb

## 2017-05-21 DIAGNOSIS — C3412 Malignant neoplasm of upper lobe, left bronchus or lung: Secondary | ICD-10-CM

## 2017-05-21 DIAGNOSIS — C3492 Malignant neoplasm of unspecified part of left bronchus or lung: Secondary | ICD-10-CM

## 2017-05-21 DIAGNOSIS — Z5111 Encounter for antineoplastic chemotherapy: Secondary | ICD-10-CM

## 2017-05-21 LAB — CBC WITH DIFFERENTIAL/PLATELET
BASO%: 0.3 % (ref 0.0–2.0)
BASOS ABS: 0 10*3/uL (ref 0.0–0.1)
EOS%: 2 % (ref 0.0–7.0)
Eosinophils Absolute: 0.2 10*3/uL (ref 0.0–0.5)
HEMATOCRIT: 32.9 % — AB (ref 34.8–46.6)
HGB: 10.4 g/dL — ABNORMAL LOW (ref 11.6–15.9)
LYMPH#: 1 10*3/uL (ref 0.9–3.3)
LYMPH%: 11.1 % — AB (ref 14.0–49.7)
MCH: 27.7 pg (ref 25.1–34.0)
MCHC: 31.6 g/dL (ref 31.5–36.0)
MCV: 87.5 fL (ref 79.5–101.0)
MONO#: 0.5 10*3/uL (ref 0.1–0.9)
MONO%: 5.6 % (ref 0.0–14.0)
NEUT#: 7.2 10*3/uL — ABNORMAL HIGH (ref 1.5–6.5)
NEUT%: 81 % — AB (ref 38.4–76.8)
PLATELETS: 363 10*3/uL (ref 145–400)
RBC: 3.76 10*6/uL (ref 3.70–5.45)
RDW: 13.8 % (ref 11.2–14.5)
WBC: 8.9 10*3/uL (ref 3.9–10.3)

## 2017-05-21 LAB — COMPREHENSIVE METABOLIC PANEL
ALBUMIN: 2.4 g/dL — AB (ref 3.5–5.0)
ALK PHOS: 143 U/L (ref 40–150)
ALT: 15 U/L (ref 0–55)
AST: 14 U/L (ref 5–34)
Anion Gap: 8 mEq/L (ref 3–11)
BILIRUBIN TOTAL: 0.38 mg/dL (ref 0.20–1.20)
BUN: 22.6 mg/dL (ref 7.0–26.0)
CO2: 27 meq/L (ref 22–29)
Calcium: 11.3 mg/dL — ABNORMAL HIGH (ref 8.4–10.4)
Chloride: 100 mEq/L (ref 98–109)
Creatinine: 1.1 mg/dL (ref 0.6–1.1)
EGFR: 47 mL/min/{1.73_m2} — AB (ref >60)
GLUCOSE: 162 mg/dL — AB (ref 70–140)
POTASSIUM: 3.6 meq/L (ref 3.5–5.1)
SODIUM: 135 meq/L — AB (ref 136–145)
TOTAL PROTEIN: 6.7 g/dL (ref 6.4–8.3)

## 2017-05-21 MED ORDER — DIPHENHYDRAMINE HCL 50 MG/ML IJ SOLN
50.0000 mg | Freq: Once | INTRAMUSCULAR | Status: AC
  Administered 2017-05-21: 50 mg via INTRAVENOUS

## 2017-05-21 MED ORDER — DIPHENHYDRAMINE HCL 50 MG/ML IJ SOLN
INTRAMUSCULAR | Status: AC
  Filled 2017-05-21: qty 1

## 2017-05-21 MED ORDER — PACLITAXEL CHEMO INJECTION 300 MG/50ML
45.0000 mg/m2 | Freq: Once | INTRAVENOUS | Status: AC
  Administered 2017-05-21: 72 mg via INTRAVENOUS
  Filled 2017-05-21: qty 12

## 2017-05-21 MED ORDER — PALONOSETRON HCL INJECTION 0.25 MG/5ML
INTRAVENOUS | Status: AC
  Filled 2017-05-21: qty 5

## 2017-05-21 MED ORDER — SODIUM CHLORIDE 0.9 % IV SOLN
Freq: Once | INTRAVENOUS | Status: AC
  Administered 2017-05-21: 10:00:00 via INTRAVENOUS

## 2017-05-21 MED ORDER — SODIUM CHLORIDE 0.9 % IV SOLN
122.4000 mg | Freq: Once | INTRAVENOUS | Status: AC
  Administered 2017-05-21: 120 mg via INTRAVENOUS
  Filled 2017-05-21: qty 12

## 2017-05-21 MED ORDER — SODIUM CHLORIDE 0.9 % IV SOLN
INTRAVENOUS | Status: AC
  Administered 2017-05-21: 11:00:00 via INTRAVENOUS

## 2017-05-21 MED ORDER — FAMOTIDINE IN NACL 20-0.9 MG/50ML-% IV SOLN
INTRAVENOUS | Status: AC
  Filled 2017-05-21: qty 50

## 2017-05-21 MED ORDER — FAMOTIDINE IN NACL 20-0.9 MG/50ML-% IV SOLN
20.0000 mg | Freq: Once | INTRAVENOUS | Status: AC
  Administered 2017-05-21: 20 mg via INTRAVENOUS

## 2017-05-21 MED ORDER — DEXAMETHASONE SODIUM PHOSPHATE 100 MG/10ML IJ SOLN
20.0000 mg | Freq: Once | INTRAMUSCULAR | Status: AC
  Administered 2017-05-21: 20 mg via INTRAVENOUS
  Filled 2017-05-21: qty 2

## 2017-05-21 MED ORDER — PALONOSETRON HCL INJECTION 0.25 MG/5ML
0.2500 mg | Freq: Once | INTRAVENOUS | Status: AC
  Administered 2017-05-21: 0.25 mg via INTRAVENOUS

## 2017-05-21 NOTE — Telephone Encounter (Addendum)
I spoke to [name] - April is her niece. I reviewed s/s hypercalcemia with [name] and instructed [name] to have pt stop her calcium because it was to high today.

## 2017-05-21 NOTE — Addendum Note (Signed)
Addended by: Ardeen Garland on: 05/21/2017 03:56 PM   Modules accepted: Orders

## 2017-05-21 NOTE — Progress Notes (Signed)
Pt tolerated treatment well. Discharge instructions printed and verbally reviewed with pt and patient's daughter. Pt and patient's daughter verbalizes understanding. Pt stable at discharge.

## 2017-05-21 NOTE — Patient Instructions (Signed)
[facility] Discharge Instructions for Patients Receiving Chemotherapy  Today you received the following chemotherapy agents: Taxol and Carboplatin   To help prevent nausea and vomiting after your treatment, we encourage you to take your nausea medication as directed.    If you develop nausea and vomiting that is not controlled by your nausea medication, call the clinic.   BELOW ARE SYMPTOMS THAT SHOULD BE REPORTED IMMEDIATELY:  *FEVER GREATER THAN 100.5 F  *CHILLS WITH OR WITHOUT FEVER  NAUSEA AND VOMITING THAT IS NOT CONTROLLED WITH YOUR NAUSEA MEDICATION  *UNUSUAL SHORTNESS OF BREATH  *UNUSUAL BRUISING OR BLEEDING  TENDERNESS IN MOUTH AND THROAT WITH OR WITHOUT PRESENCE OF ULCERS  *URINARY PROBLEMS  *BOWEL PROBLEMS  UNUSUAL RASH Items with * indicate a potential emergency and should be followed up as soon as possible.  Feel free to call the clinic should you have any questions or concerns. The clinic phone number is [phone].  Please show the [facility] at check-in to the Emergency Department and triage nurse.    Paclitaxel injection What is this medicine? PACLITAXEL (PAK li TAX el) is a chemotherapy drug. It targets fast dividing cells, like cancer cells, and causes these cells to die. This medicine is used to treat ovarian cancer, breast cancer, and other cancers. This medicine may be used for other purposes; ask your health care provider or pharmacist if you have questions. COMMON BRAND NAME(S): Onxol, Taxol What should I tell my health care provider before I take this medicine? They need to know if you have any of these conditions: -blood disorders -irregular heartbeat -infection (especially a virus infection such as chickenpox, cold sores, or herpes) -liver disease -previous or ongoing radiation therapy -an unusual or allergic reaction to paclitaxel, alcohol, polyoxyethylated castor oil, other chemotherapy agents, other medicines,  foods, dyes, or preservatives -pregnant or trying to get pregnant -breast-feeding How should I use this medicine? This drug is given as an infusion into a vein. It is administered in a hospital or clinic by a specially trained health care professional. Talk to your pediatrician regarding the use of this medicine in children. Special care may be needed. Overdosage: If you think you have taken too much of this medicine contact a poison control center or emergency room at once. NOTE: This medicine is only for you. Do not share this medicine with others. What if I miss a dose? It is important not to miss your dose. Call your doctor or health care professional if you are unable to keep an appointment. What may interact with this medicine? Do not take this medicine with any of the following medications: -disulfiram -metronidazole This medicine may also interact with the following medications: -cyclosporine -diazepam -ketoconazole -medicines to increase blood counts like filgrastim, pegfilgrastim, sargramostim -other chemotherapy drugs like cisplatin, doxorubicin, epirubicin, etoposide, teniposide, vincristine -quinidine -testosterone -vaccines -verapamil Talk to your doctor or health care professional before taking any of these medicines: -acetaminophen -aspirin -ibuprofen -ketoprofen -naproxen This list may not describe all possible interactions. Give your health care provider a list of all the medicines, herbs, non-prescription drugs, or dietary supplements you use. Also tell them if you smoke, drink alcohol, or use illegal drugs. Some items may interact with your medicine. What should I watch for while using this medicine? Your condition will be monitored carefully while you are receiving this medicine. You will need important blood work done while you are taking this medicine. This medicine can cause serious allergic reactions. To reduce your  risk you will need to take other  medicine(s) before treatment with this medicine. If you experience allergic reactions like skin rash, itching or hives, swelling of the face, lips, or tongue, tell your doctor or health care professional right away. In some cases, you may be given additional medicines to help with side effects. Follow all directions for their use. This drug may make you feel generally unwell. This is not uncommon, as chemotherapy can affect healthy cells as well as cancer cells. Report any side effects. Continue your course of treatment even though you feel ill unless your doctor tells you to stop. Call your doctor or health care professional for advice if you get a fever, chills or sore throat, or other symptoms of a cold or flu. Do not treat yourself. This drug decreases your body's ability to fight infections. Try to avoid being around people who are sick. This medicine may increase your risk to bruise or bleed. Call your doctor or health care professional if you notice any unusual bleeding. Be careful brushing and flossing your teeth or using a toothpick because you may get an infection or bleed more easily. If you have any dental work done, tell your dentist you are receiving this medicine. Avoid taking products that contain aspirin, acetaminophen, ibuprofen, naproxen, or ketoprofen unless instructed by your doctor. These medicines may hide a fever. Do not become pregnant while taking this medicine. Women should inform their doctor if they wish to become pregnant or think they might be pregnant. There is a potential for serious side effects to an unborn child. Talk to your health care professional or pharmacist for more information. Do not breast-feed an infant while taking this medicine. Men are advised not to father a child while receiving this medicine. This product may contain alcohol. Ask your pharmacist or healthcare provider if this medicine contains alcohol. Be sure to tell all healthcare providers you are  taking this medicine. Certain medicines, like metronidazole and disulfiram, can cause an unpleasant reaction when taken with alcohol. The reaction includes flushing, headache, nausea, vomiting, sweating, and increased thirst. The reaction can last from 30 minutes to several hours. What side effects may I notice from receiving this medicine? Side effects that you should report to your doctor or health care professional as soon as possible: -allergic reactions like skin rash, itching or hives, swelling of the face, lips, or tongue -low blood counts - This drug may decrease the number of white blood cells, red blood cells and platelets. You may be at increased risk for infections and bleeding. -signs of infection - fever or chills, cough, sore throat, pain or difficulty passing urine -signs of decreased platelets or bleeding - bruising, pinpoint red spots on the skin, black, tarry stools, nosebleeds -signs of decreased red blood cells - unusually weak or tired, fainting spells, lightheadedness -breathing problems -chest pain -high or low blood pressure -mouth sores -nausea and vomiting -pain, swelling, redness or irritation at the injection site -pain, tingling, numbness in the hands or feet -slow or irregular heartbeat -swelling of the ankle, feet, hands Side effects that usually do not require medical attention (report to your doctor or health care professional if they continue or are bothersome): -bone pain -complete hair loss including hair on your head, underarms, pubic hair, eyebrows, and eyelashes -changes in the color of fingernails -diarrhea -loosening of the fingernails -loss of appetite -muscle or joint pain -red flush to skin -sweating This list may not describe all possible side effects. Call your  doctor for medical advice about side effects. You may report side effects to FDA at 1-800-FDA-1088. Where should I keep my medicine? This drug is given in a hospital or clinic and  will not be stored at home. NOTE: This sheet is a summary. It may not cover all possible information. If you have questions about this medicine, talk to your doctor, pharmacist, or health care provider.  2018 [employer]/Gold Standard (2015-03-09 19:58:00)    Carboplatin injection What is this medicine? CARBOPLATIN (KAR boe pla tin) is a chemotherapy drug. It targets fast dividing cells, like cancer cells, and causes these cells to die. This medicine is used to treat ovarian cancer and many other cancers. This medicine may be used for other purposes; ask your health care provider or pharmacist if you have questions. COMMON BRAND NAME(S): Paraplatin What should I tell my health care provider before I take this medicine? They need to know if you have any of these conditions: -blood disorders -hearing problems -kidney disease -recent or ongoing radiation therapy -an unusual or allergic reaction to carboplatin, cisplatin, other chemotherapy, other medicines, foods, dyes, or preservatives -pregnant or trying to get pregnant -breast-feeding How should I use this medicine? This drug is usually given as an infusion into a vein. It is administered in a hospital or clinic by a specially trained health care professional. Talk to your pediatrician regarding the use of this medicine in children. Special care may be needed. Overdosage: If you think you have taken too much of this medicine contact a poison control center or emergency room at once. NOTE: This medicine is only for you. Do not share this medicine with others. What if I miss a dose? It is important not to miss a dose. Call your doctor or health care professional if you are unable to keep an appointment. What may interact with this medicine? -medicines for seizures -medicines to increase blood counts like filgrastim, pegfilgrastim, sargramostim -some antibiotics like amikacin, gentamicin, neomycin, streptomycin, tobramycin -vaccines Talk  to your doctor or health care professional before taking any of these medicines: -acetaminophen -aspirin -ibuprofen -ketoprofen -naproxen This list may not describe all possible interactions. Give your health care provider a list of all the medicines, herbs, non-prescription drugs, or dietary supplements you use. Also tell them if you smoke, drink alcohol, or use illegal drugs. Some items may interact with your medicine. What should I watch for while using this medicine? Your condition will be monitored carefully while you are receiving this medicine. You will need important blood work done while you are taking this medicine. This drug may make you feel generally unwell. This is not uncommon, as chemotherapy can affect healthy cells as well as cancer cells. Report any side effects. Continue your course of treatment even though you feel ill unless your doctor tells you to stop. In some cases, you may be given additional medicines to help with side effects. Follow all directions for their use. Call your doctor or health care professional for advice if you get a fever, chills or sore throat, or other symptoms of a cold or flu. Do not treat yourself. This drug decreases your body's ability to fight infections. Try to avoid being around people who are sick. This medicine may increase your risk to bruise or bleed. Call your doctor or health care professional if you notice any unusual bleeding. Be careful brushing and flossing your teeth or using a toothpick because you may get an infection or bleed more easily. If you have any  dental work done, tell your dentist you are receiving this medicine. Avoid taking products that contain aspirin, acetaminophen, ibuprofen, naproxen, or ketoprofen unless instructed by your doctor. These medicines may hide a fever. Do not become pregnant while taking this medicine. Women should inform their doctor if they wish to become pregnant or think they might be pregnant. There  is a potential for serious side effects to an unborn child. Talk to your health care professional or pharmacist for more information. Do not breast-feed an infant while taking this medicine. What side effects may I notice from receiving this medicine? Side effects that you should report to your doctor or health care professional as soon as possible: -allergic reactions like skin rash, itching or hives, swelling of the face, lips, or tongue -signs of infection - fever or chills, cough, sore throat, pain or difficulty passing urine -signs of decreased platelets or bleeding - bruising, pinpoint red spots on the skin, black, tarry stools, nosebleeds -signs of decreased red blood cells - unusually weak or tired, fainting spells, lightheadedness -breathing problems -changes in hearing -changes in vision -chest pain -high blood pressure -low blood counts - This drug may decrease the number of white blood cells, red blood cells and platelets. You may be at increased risk for infections and bleeding. -nausea and vomiting -pain, swelling, redness or irritation at the injection site -pain, tingling, numbness in the hands or feet -problems with balance, talking, walking -trouble passing urine or change in the amount of urine Side effects that usually do not require medical attention (report to your doctor or health care professional if they continue or are bothersome): -hair loss -loss of appetite -metallic taste in the mouth or changes in taste This list may not describe all possible side effects. Call your doctor for medical advice about side effects. You may report side effects to FDA at 1-800-FDA-1088. Where should I keep my medicine? This drug is given in a hospital or clinic and will not be stored at home. NOTE: This sheet is a summary. It may not cover all possible information. If you have questions about this medicine, talk to your doctor, pharmacist, or health care provider.  2018  [employer]/Gold Standard (2007-08-13 14:38:05)

## 2017-05-21 NOTE — Telephone Encounter (Signed)
Granddaughter called with concerns about 'changes I am seeing ". I tried to call her back-left voice mail -I do not see [name] on the [facility] health HIPPA forms, but she is on [facility] forms.

## 2017-05-23 ENCOUNTER — Ambulatory Visit
Admission: RE | Admit: 2017-05-23 | Discharge: 2017-05-23 | Disposition: A | Discharge status: Home / Self Care | Payer: Medicare Other | Source: Ambulatory Visit | Attending: Radiation Oncology | Admitting: Radiation Oncology

## 2017-05-23 DIAGNOSIS — C3412 Malignant neoplasm of upper lobe, left bronchus or lung: Secondary | ICD-10-CM | POA: Diagnosis not present

## 2017-05-24 ENCOUNTER — Ambulatory Visit
Admission: RE | Admit: 2017-05-24 | Discharge: 2017-05-24 | Disposition: A | Discharge status: Home / Self Care | Payer: Medicare Other | Source: Ambulatory Visit | Attending: Radiation Oncology | Admitting: Radiation Oncology

## 2017-05-24 ENCOUNTER — Telehealth: Payer: Self-pay | Admitting: Medical Oncology

## 2017-05-24 DIAGNOSIS — C3412 Malignant neoplasm of upper lobe, left bronchus or lung: Secondary | ICD-10-CM | POA: Diagnosis not present

## 2017-05-24 NOTE — Telephone Encounter (Addendum)
April called and said [name] was confused and hallucinating and talking  "off the wall conversation - asking her to call her attorney because pt said  " the police are looking for me because they said I committed murder, the staff are tying me down,  not feeding me ".

## 2017-05-24 NOTE — Telephone Encounter (Signed)
Please contact social worker and not sure if risk management if needed. I am not sure what I am supposed to do with this message.

## 2017-05-25 ENCOUNTER — Ambulatory Visit
Admission: RE | Admit: 2017-05-25 | Discharge: 2017-05-25 | Disposition: A | Discharge status: Home / Self Care | Payer: Medicare Other | Source: Ambulatory Visit | Attending: Radiation Oncology | Admitting: Radiation Oncology

## 2017-05-25 DIAGNOSIS — C3412 Malignant neoplasm of upper lobe, left bronchus or lung: Secondary | ICD-10-CM | POA: Diagnosis not present

## 2017-05-25 NOTE — Telephone Encounter (Addendum)
Per Julien Nordmann, continue weekly labs. Daughter notified via VM.

## 2017-05-28 ENCOUNTER — Encounter: Payer: Self-pay | Admitting: *Deleted

## 2017-05-28 ENCOUNTER — Encounter: Payer: Self-pay | Admitting: Oncology

## 2017-05-28 ENCOUNTER — Inpatient Hospital Stay: Payer: Medicare Other | Admitting: Nutrition

## 2017-05-28 ENCOUNTER — Inpatient Hospital Stay: Payer: Medicare Other

## 2017-05-28 ENCOUNTER — Inpatient Hospital Stay: Payer: Medicare Other | Attending: Internal Medicine

## 2017-05-28 ENCOUNTER — Inpatient Hospital Stay (HOSPITAL_BASED_OUTPATIENT_CLINIC_OR_DEPARTMENT_OTHER): Payer: Medicare Other | Admitting: Oncology

## 2017-05-28 ENCOUNTER — Ambulatory Visit
Admission: RE | Admit: 2017-05-28 | Discharge: 2017-05-28 | Disposition: A | Discharge status: Home / Self Care | Payer: Medicare Other | Source: Ambulatory Visit | Attending: Radiation Oncology | Admitting: Radiation Oncology

## 2017-05-28 VITALS — BP 154/67 | HR 78 | Temp 98.1°F | Resp 17 | Ht 61.0 in | Wt 122.5 lb

## 2017-05-28 DIAGNOSIS — C3412 Malignant neoplasm of upper lobe, left bronchus or lung: Secondary | ICD-10-CM

## 2017-05-28 DIAGNOSIS — E785 Hyperlipidemia, unspecified: Secondary | ICD-10-CM | POA: Diagnosis not present

## 2017-05-28 DIAGNOSIS — Z8673 Personal history of transient ischemic attack (TIA), and cerebral infarction without residual deficits: Secondary | ICD-10-CM | POA: Insufficient documentation

## 2017-05-28 DIAGNOSIS — K449 Diaphragmatic hernia without obstruction or gangrene: Secondary | ICD-10-CM | POA: Diagnosis not present

## 2017-05-28 DIAGNOSIS — C3492 Malignant neoplasm of unspecified part of left bronchus or lung: Secondary | ICD-10-CM

## 2017-05-28 DIAGNOSIS — Z79899 Other long term (current) drug therapy: Secondary | ICD-10-CM | POA: Insufficient documentation

## 2017-05-28 DIAGNOSIS — Z5111 Encounter for antineoplastic chemotherapy: Secondary | ICD-10-CM | POA: Diagnosis not present

## 2017-05-28 DIAGNOSIS — R590 Localized enlarged lymph nodes: Secondary | ICD-10-CM | POA: Insufficient documentation

## 2017-05-28 DIAGNOSIS — I6529 Occlusion and stenosis of unspecified carotid artery: Secondary | ICD-10-CM | POA: Diagnosis not present

## 2017-05-28 DIAGNOSIS — Z7982 Long term (current) use of aspirin: Secondary | ICD-10-CM | POA: Insufficient documentation

## 2017-05-28 DIAGNOSIS — I2510 Atherosclerotic heart disease of native coronary artery without angina pectoris: Secondary | ICD-10-CM | POA: Insufficient documentation

## 2017-05-28 DIAGNOSIS — I252 Old myocardial infarction: Secondary | ICD-10-CM | POA: Insufficient documentation

## 2017-05-28 LAB — COMPREHENSIVE METABOLIC PANEL
ALT: 17 U/L (ref 0–55)
AST: 12 U/L (ref 5–34)
Albumin: 2.6 g/dL — ABNORMAL LOW (ref 3.5–5.0)
Alkaline Phosphatase: 126 U/L (ref 40–150)
Anion gap: 7 (ref 3–11)
BUN: 14 mg/dL (ref 7–26)
CO2: 25 mmol/L (ref 22–29)
CREATININE: 0.82 mg/dL (ref 0.60–1.10)
Calcium: 9.7 mg/dL (ref 8.4–10.4)
Chloride: 103 mmol/L (ref 98–109)
GFR calc non Af Amer: >60 mL/min (ref >60)
Glucose, Bld: 96 mg/dL (ref 70–140)
POTASSIUM: 5.2 mmol/L — AB (ref 3.3–4.7)
Sodium: 135 mmol/L — ABNORMAL LOW (ref 136–145)
Total Bilirubin: 0.3 mg/dL (ref 0.2–1.2)
Total Protein: 6.7 g/dL (ref 6.4–8.3)

## 2017-05-28 LAB — CBC WITH DIFFERENTIAL/PLATELET
Abs Granulocyte: 4.8 10*3/uL (ref 1.5–6.5)
BASOS ABS: 0 10*3/uL (ref 0.0–0.1)
Basophils Relative: 1 %
EOS ABS: 0.1 10*3/uL (ref 0.0–0.5)
EOS PCT: 2 %
HCT: 30.2 % — ABNORMAL LOW (ref 34.8–46.6)
Hemoglobin: 9.9 g/dL — ABNORMAL LOW (ref 11.6–15.9)
Lymphocytes Relative: 12 %
Lymphs Abs: 0.8 10*3/uL — ABNORMAL LOW (ref 0.9–3.3)
MCH: 28.3 pg (ref 25.1–34.0)
MCHC: 32.7 g/dL (ref 31.5–36.0)
MCV: 86.5 fL (ref 79.5–101.0)
Monocytes Absolute: 0.7 10*3/uL (ref 0.1–0.9)
Monocytes Relative: 11 %
NEUTROS PCT: 74 %
Neutro Abs: 4.8 10*3/uL (ref 1.5–6.5)
PLATELETS: 306 10*3/uL (ref 145–400)
RBC: 3.49 MIL/uL — AB (ref 3.70–5.45)
RDW: 14.1 % (ref 11.2–16.1)
WBC: 6.5 10*3/uL (ref 3.9–10.3)

## 2017-05-28 MED ORDER — SODIUM CHLORIDE 0.9 % IV SOLN
Freq: Once | INTRAVENOUS | Status: AC
  Administered 2017-05-28: 16:00:00 via INTRAVENOUS

## 2017-05-28 MED ORDER — SODIUM CHLORIDE 0.9 % IV SOLN
45.0000 mg/m2 | Freq: Once | INTRAVENOUS | Status: AC
  Administered 2017-05-28: 72 mg via INTRAVENOUS
  Filled 2017-05-28: qty 12

## 2017-05-28 MED ORDER — SODIUM CHLORIDE 0.9 % IV SOLN
20.0000 mg | Freq: Once | INTRAVENOUS | Status: AC
  Administered 2017-05-28: 20 mg via INTRAVENOUS
  Filled 2017-05-28: qty 2

## 2017-05-28 MED ORDER — FAMOTIDINE IN NACL 20-0.9 MG/50ML-% IV SOLN
20.0000 mg | Freq: Once | INTRAVENOUS | Status: AC
  Administered 2017-05-28: 20 mg via INTRAVENOUS

## 2017-05-28 MED ORDER — PALONOSETRON HCL INJECTION 0.25 MG/5ML
INTRAVENOUS | Status: AC
  Filled 2017-05-28: qty 5

## 2017-05-28 MED ORDER — DIPHENHYDRAMINE HCL 50 MG/ML IJ SOLN
INTRAMUSCULAR | Status: AC
  Filled 2017-05-28: qty 1

## 2017-05-28 MED ORDER — CARBOPLATIN CHEMO INJECTION 450 MG/45ML
122.4000 mg | Freq: Once | INTRAVENOUS | Status: AC
  Administered 2017-05-28: 120 mg via INTRAVENOUS
  Filled 2017-05-28: qty 12

## 2017-05-28 MED ORDER — PALONOSETRON HCL INJECTION 0.25 MG/5ML
0.2500 mg | Freq: Once | INTRAVENOUS | Status: AC
  Administered 2017-05-28: 0.25 mg via INTRAVENOUS

## 2017-05-28 MED ORDER — DIPHENHYDRAMINE HCL 50 MG/ML IJ SOLN
50.0000 mg | Freq: Once | INTRAMUSCULAR | Status: AC
  Administered 2017-05-28: 50 mg via INTRAVENOUS

## 2017-05-28 MED ORDER — FAMOTIDINE IN NACL 20-0.9 MG/50ML-% IV SOLN
INTRAVENOUS | Status: AC
  Filled 2017-05-28: qty 50

## 2017-05-28 MED ORDER — SODIUM CHLORIDE 0.9 % IV SOLN: 122.4000 mg | Freq: Once | INTRAVENOUS | Status: DC

## 2017-05-28 NOTE — Progress Notes (Signed)
[place] OFFICE PROGRESS NOTE  Leonard Downing, MD [name] [address]  DIAGNOSIS: Stage IIIa (T2b, N2, M0) non-small cell lung cancer, squamous cell carcinoma presented with left upper lobe lung mass left hilar and mediastinal lymphadenopathy diagnosed in November 2018.  PDL 1 less than 1%.  PRIOR THERAPY: None  CURRENT THERAPY: Concurrent chemoradiation with weekly carboplatin for an AUC of 2 with paclitaxel 45 mg/m.  First dose 05/21/2017.  Status post 1 cycle.  INTERVAL HISTORY: [name] 84 y.o. female returns for routine follow-up visit accompanied by her 59.  The patient continues to have fatigue and generalized weakness.  She is in a skilled nursing facility undergoing rehabilitation.  She is working with physical therapy in order to get stronger in order to go home.  The patient denies fevers and chills.  Denies chest pain, shortness of breath, hemoptysis.  She does have a nonproductive cough which is been ongoing.  Denies nausea, vomiting, constipation, diarrhea.  She denies weight loss and night sweats.  She tolerated her first cycle of chemotherapy well overall.  The patient is here for evaluation prior to cycle 2 of her chemotherapy.  MEDICAL HISTORY: Past Medical History:  Diagnosis Date  . Abdominal aortic atherosclerosis ([place])   . CAD (coronary artery disease)   . Carotid artery occlusion   . Common bile duct dilation   . Gallstones   . Headache(784.0)   . Hiatal hernia   . HLD (hyperlipidemia)   . Myocardial infarction ([place])   . PONV (postoperative nausea and vomiting)   . Stroke [facility]) 2003    ALLERGIES:  is allergic to dextroamphetamine sulfate er and other.  MEDICATIONS:  Current Outpatient Medications  Medication Sig Dispense Refill  . ALPRAZolam (XANAX) 0.25 MG tablet Take 0.5 tablets (0.125 mg total) by mouth 2 (two) times daily as needed for anxiety. 10 tablet 0  . aspirin 81 MG EC tablet  Take 81 mg by mouth daily.      . feeding supplement, ENSURE ENLIVE, (ENSURE ENLIVE) LIQD Take 237 mLs 2 (two) times daily between meals by mouth. 30 Bottle 0  . Glucosamine-Chondroitin (GLUCOSAMINE CHONDR COMPLEX PO) Take 1 capsule by mouth daily.    Marland Kitchen guaiFENesin (MUCINEX) 600 MG 12 hr tablet Take 2 tablets (1,200 mg total) 2 (two) times daily by mouth. 30 tablet 0  . lisinopril (PRINIVIL,ZESTRIL) 20 MG tablet Take 1 tablet (20 mg total) daily by mouth. 30 tablet 0  . Multiple Vitamin (MULTIVITAMIN WITH MINERALS) TABS tablet Take 1 tablet by mouth at bedtime.    . mirtazapine (REMERON) 7.5 MG tablet Take 1 tablet (7.5 mg total) by mouth at bedtime. (Patient not taking: Reported on 05/28/2017) 30 tablet 0  . nitroGLYCERIN (NITROSTAT) 0.4 MG SL tablet Place 0.4 mg under the tongue every 5 (five) minutes as needed for chest pain.    . polyethylene glycol (MIRALAX / GLYCOLAX) packet Take 17 g daily as needed by mouth. 14 each 0  . prochlorperazine (COMPAZINE) 10 MG tablet Take 1 tablet (10 mg total) by mouth every 6 (six) hours as needed for nausea or vomiting. 30 tablet 0  . Wound Dressings (SONAFINE) Apply 1 application topically 2 (two) times daily.     No current facility-administered medications for this visit.     SURGICAL HISTORY:  Past Surgical History:  Procedure Laterality Date  . ABDOMINAL HYSTERECTOMY    . ANKLE FRACTURE SURGERY    . APPENDECTOMY    .  BLADDER SURGERY    . CHOLECYSTECTOMY N/A 05/05/2013   Procedure: LAPAROSCOPIC CHOLECYSTECTOMY WITH INTRAOPERATIVE CHOLANGIOGRAM;  Surgeon: Ralene Ok, MD;  Location: [facility];  Service: General;  Laterality: N/A;  . HIP ARTHROPLASTY Right 01/10/2014   Procedure: RIGHT HIP HEMIARTHROPLASTY;  Surgeon: Johnn Hai, MD;  Location: [facility];  Service: Orthopedics;  Laterality: Right;  DEPUY AML LATERAL WITH MARK II  . ROTATOR CUFF REPAIR Left   . TONSILLECTOMY      REVIEW OF SYSTEMS:   Review of Systems  Constitutional: Negative  for appetite change, chills, fever and unexpected weight change. Positive for fatigue and weakness. HENT:   Negative for mouth sores, nosebleeds, sore throat and trouble swallowing.   Eyes: Negative for eye problems and icterus.  Respiratory: Negative for cough, hemoptysis, shortness of breath and wheezing.   Cardiovascular: Negative for chest pain and leg swelling.  Gastrointestinal: Negative for abdominal pain, constipation, diarrhea, nausea and vomiting.  Genitourinary: Negative for bladder incontinence, difficulty urinating, dysuria, frequency and hematuria.   Musculoskeletal: Negative for back pain, gait problem, neck pain and neck stiffness.  Skin: Negative for itching and rash.  Neurological: Negative for dizziness, extremity weakness, gait problem, headaches, light-headedness and seizures.  Hematological: Negative for adenopathy. Does not bruise/bleed easily.  Psychiatric/Behavioral: Negative for confusion, depression and sleep disturbance. The patient is not nervous/anxious.     PHYSICAL EXAMINATION:  Blood pressure (!) 154/67, pulse 78, temperature 98.1 F (36.7 C), temperature source Oral, resp. rate 17, height 5\' 1"  (1.549 m), weight 122 lb 8 oz (55.6 kg), SpO2 98 %.  ECOG PERFORMANCE STATUS: 1 - Symptomatic but completely ambulatory  Physical Exam  Constitutional: Oriented to person, place, and time and well-developed, well-nourished, and in no distress. No distress.  HENT:  Head: Normocephalic and atraumatic.  Mouth/Throat: Oropharynx is clear and moist. No oropharyngeal exudate.  Eyes: Conjunctivae are normal. Right eye exhibits no discharge. Left eye exhibits no discharge. No scleral icterus.  Neck: Normal range of motion. Neck supple.  Cardiovascular: Normal rate, regular rhythm, normal heart sounds and intact distal pulses.   Pulmonary/Chest: Effort normal and breath sounds normal. No respiratory distress. No wheezes. No rales.  Abdominal: Soft. Bowel sounds are normal.  Exhibits no distension and no mass. There is no tenderness.  Musculoskeletal: Normal range of motion. Exhibits no edema.  Lymphadenopathy:    No cervical adenopathy.  Neurological: Alert and oriented to person, place, and time. Exhibits normal muscle tone. Gait normal. Coordination normal.  Skin: Skin is warm and dry. No rash noted. Not diaphoretic. No erythema. No pallor.  Psychiatric: Mood, memory and judgment normal.  Vitals reviewed.  LABORATORY DATA: Lab Results  Component Value Date   WBC 6.5 05/28/2017   HGB 9.9 (L) 05/28/2017   HCT 30.2 (L) 05/28/2017   MCV 86.5 05/28/2017   PLT 306 05/28/2017      Chemistry      Component Value Date/Time   NA 135 (L) 05/28/2017 1320   NA 135 (L) 05/21/2017 0829   K 5.2 (H) 05/28/2017 1320   K 3.6 05/21/2017 0829   CL 103 05/28/2017 1320   CO2 25 05/28/2017 1320   CO2 27 05/21/2017 0829   BUN 14 05/28/2017 1320   BUN 22.6 05/21/2017 0829   CREATININE 0.82 05/28/2017 1320   CREATININE 1.1 05/21/2017 0829      Component Value Date/Time   CALCIUM 9.7 05/28/2017 1320   CALCIUM 11.3 (H) 05/21/2017 0829   ALKPHOS 126 05/28/2017 1320   ALKPHOS  143 05/21/2017 0829   AST 12 05/28/2017 1320   AST 14 05/21/2017 0829   ALT 17 05/28/2017 1320   ALT 15 05/21/2017 0829   BILITOT 0.3 05/28/2017 1320   BILITOT 0.38 05/21/2017 0829       RADIOGRAPHIC STUDIES:  Dg Chest 2 View  Result Date: 05/14/2017 CLINICAL DATA:  Lung carcinoma EXAM: CHEST  2 VIEW COMPARISON:  Chest radiograph May 10, 2017 and PET-CT April 27, 2017. FINDINGS: The mass in the left apex region with volume loss remain stable. Lungs elsewhere clear. Heart size and pulmonary vascularity are within normal limits. No adenopathy demonstrated by radiography. There is aortic atherosclerosis. There is calcification in the left carotid artery. No evident bone lesions. IMPRESSION: Mass with volume loss left apex region, grossly stable. No new opacity. Stable cardiac  silhouette. There is aortic atherosclerosis as well as calcification in the left carotid artery. Aortic Atherosclerosis (ICD10-I70.0). Electronically Signed   By: Lowella Grip III M.D.   On: 05/14/2017 08:37   Dg Chest 2 View  Result Date: 05/10/2017 CLINICAL DATA:  Shortness of breath and productive cough. EXAM: CHEST  2 VIEW COMPARISON:  Head CT dated April 27, 2017. Chest x-ray dated April 04, 2017. FINDINGS: The cardiomediastinal silhouette is normal in size. Normal pulmonary vascularity. Unchanged left upper lobe mass. No focal consolidation, pleural effusion, or pneumothorax. No acute osseous abnormality. IMPRESSION: 1. Unchanged left upper lobe lung mass. No active cardiopulmonary disease. Electronically Signed   By: Titus Dubin M.D.   On: 05/10/2017 15:46   Ct Head Wo Contrast  Result Date: 05/10/2017 CLINICAL DATA:  Weakness and difficulty ambulating beginning today. History of stroke. EXAM: CT HEAD WITHOUT CONTRAST TECHNIQUE: Contiguous axial images were obtained from the base of the skull through the vertex without intravenous contrast. COMPARISON:  04/27/2017 FINDINGS: Brain: Ventricles and cisterns are normal. Remaining CSF spaces are unremarkable. There is moderate chronic ischemic microvascular disease. There is a small old left frontal infarct. There is no mass, mass effect, shift of midline structures or acute hemorrhage. No evidence of acute infarction. Vascular: No hyperdense vessel or unexpected calcification. Skull: Normal. Negative for fracture or focal lesion. Sinuses/Orbits: No acute finding. Other: None. IMPRESSION: No acute intracranial findings. Moderate chronic ischemic microvascular disease. Small old left frontal infarct. Electronically Signed   By: Marin Olp M.D.   On: 05/10/2017 18:02     ASSESSMENT/PLAN:  Stage III squamous cell carcinoma of left lung [facility]) This is a very pleasant 84 year old white female recently diagnosed with at least stage IIIa  (T2b, N2, M0) non-small cell lung cancer, squamous cell carcinoma presented with left upper lobe lung mass left hilar and mediastinal lymphadenopathy diagnosed in November 2018.  PDL 1 is less than 1%.  The patient is currently undergoing a course of concurrent chemoradiation with weekly carboplatin for AUC of 2 with paclitaxel 45 mg/M2.    Status post 1 cycle which she tolerated well.  Labs were reviewed.  Recommend that she proceed with cycle 2 as scheduled today.  The patient was advised to call immediately if she has any concerning symptoms in the interval. The patient voices understanding of current disease status and treatment options and is in agreement with the current care plan.  All questions were answered. The patient knows to call the clinic with any problems, questions or concerns. We can certainly see the patient much sooner if necessary.  No orders of the defined types were placed in this encounter.   Erasmo Downer  Jac Romulus, DNP, AGPCNP-BC, AOCNP 05/28/17

## 2017-05-28 NOTE — Assessment & Plan Note (Signed)
This is a very pleasant 84 year old white female recently diagnosed with at least stage IIIa (T2b, N2, M0) non-small cell lung cancer, squamous cell carcinoma presented with left upper lobe lung mass left hilar and mediastinal lymphadenopathy diagnosed in November 2018.  PDL 1 is less than 1%.  The patient is currently undergoing a course of concurrent chemoradiation with weekly carboplatin for AUC of 2 with paclitaxel 45 mg/M2.    Status post 1 cycle which she tolerated well.  Labs were reviewed.  Recommend that she proceed with cycle 2 as scheduled today.  The patient was advised to call immediately if she has any concerning symptoms in the interval. The patient voices understanding of current disease status and treatment options and is in agreement with the current care plan.  All questions were answered. The patient knows to call the clinic with any problems, questions or concerns. We can certainly see the patient much sooner if necessary.

## 2017-05-28 NOTE — Progress Notes (Signed)
Pt potassium level is 5.2, okay to treat per Erasmo Downer NP.

## 2017-05-28 NOTE — Patient Instructions (Signed)
[facility] Discharge Instructions for Patients Receiving Chemotherapy  Today you received the following chemotherapy agents Taxol and carboplatin  To help prevent nausea and vomiting after your treatment, we encourage you to take your nausea medication as directed   If you develop nausea and vomiting that is not controlled by your nausea medication, call the clinic.   BELOW ARE SYMPTOMS THAT SHOULD BE REPORTED IMMEDIATELY:  *FEVER GREATER THAN 100.5 F  *CHILLS WITH OR WITHOUT FEVER  NAUSEA AND VOMITING THAT IS NOT CONTROLLED WITH YOUR NAUSEA MEDICATION  *UNUSUAL SHORTNESS OF BREATH  *UNUSUAL BRUISING OR BLEEDING  TENDERNESS IN MOUTH AND THROAT WITH OR WITHOUT PRESENCE OF ULCERS  *URINARY PROBLEMS  *BOWEL PROBLEMS  UNUSUAL RASH Items with * indicate a potential emergency and should be followed up as soon as possible.  Feel free to call the clinic should you have any questions or concerns. The clinic phone number is [phone].  Please show the CHEMO ALERT CARD at check-in to the Emergency Department and triage nurse.   

## 2017-05-28 NOTE — Progress Notes (Signed)
Brief nutrition follow-up completed with patient. Patient reports she has not had anything to eat today. She does not want to talk about the nursing home's food. Weight has improved and was documented as 122 pounds. No nutrition impact symptoms mentioned.  Nutrition diagnosis: Unintended weight loss improved.  Intervention: I provided patient with a snack while she is waiting to go back to chemotherapy. Encouraged her to order food from the menu at the [place] facility so she gets what she wants to eat.  Monitoring, evaluation, goals: Patient will tolerate adequate calories and protein for weight maintenance.  Next visit: To be scheduled as needed.  **Disclaimer: This note was dictated with voice recognition software. Similar sounding words can inadvertently be transcribed and this note may contain transcription errors which may not have been corrected upon publication of note.**

## 2017-05-29 ENCOUNTER — Telehealth: Payer: Self-pay | Admitting: Oncology

## 2017-05-29 ENCOUNTER — Ambulatory Visit
Admission: RE | Admit: 2017-05-29 | Discharge: 2017-05-29 | Disposition: A | Discharge status: Home / Self Care | Payer: Medicare Other | Source: Ambulatory Visit | Attending: Radiation Oncology | Admitting: Radiation Oncology

## 2017-05-29 DIAGNOSIS — C3412 Malignant neoplasm of upper lobe, left bronchus or lung: Secondary | ICD-10-CM | POA: Diagnosis not present

## 2017-05-29 NOTE — Telephone Encounter (Signed)
Appts already scheduled per 1/7 los - no additional appts scheduled.

## 2017-05-30 ENCOUNTER — Ambulatory Visit
Admission: RE | Admit: 2017-05-30 | Discharge: 2017-05-30 | Disposition: A | Discharge status: Home / Self Care | Payer: Medicare Other | Source: Ambulatory Visit | Attending: Radiation Oncology | Admitting: Radiation Oncology

## 2017-05-30 DIAGNOSIS — C3412 Malignant neoplasm of upper lobe, left bronchus or lung: Secondary | ICD-10-CM | POA: Diagnosis not present

## 2017-05-31 ENCOUNTER — Ambulatory Visit
Admission: RE | Admit: 2017-05-31 | Discharge: 2017-05-31 | Disposition: A | Discharge status: Home / Self Care | Payer: Medicare Other | Source: Ambulatory Visit | Attending: Radiation Oncology | Admitting: Radiation Oncology

## 2017-05-31 DIAGNOSIS — C3412 Malignant neoplasm of upper lobe, left bronchus or lung: Secondary | ICD-10-CM | POA: Diagnosis not present

## 2017-06-01 ENCOUNTER — Ambulatory Visit
Admission: RE | Admit: 2017-06-01 | Discharge: 2017-06-01 | Disposition: A | Discharge status: Home / Self Care | Payer: Medicare Other | Source: Ambulatory Visit | Attending: Radiation Oncology | Admitting: Radiation Oncology

## 2017-06-01 ENCOUNTER — Other Ambulatory Visit: Payer: Self-pay | Admitting: Radiation Oncology

## 2017-06-01 DIAGNOSIS — C3412 Malignant neoplasm of upper lobe, left bronchus or lung: Secondary | ICD-10-CM | POA: Diagnosis not present

## 2017-06-01 DIAGNOSIS — K209 Esophagitis, unspecified without bleeding: Secondary | ICD-10-CM

## 2017-06-01 MED ORDER — SUCRALFATE 1 G PO TABS: ORAL_TABLET | ORAL | 1 refills | Status: DC

## 2017-06-01 MED ORDER — LIDOCAINE VISCOUS 2 % MT SOLN: OROMUCOSAL | 1 refills | Status: DC

## 2017-06-04 ENCOUNTER — Ambulatory Visit
Admission: RE | Admit: 2017-06-04 | Discharge: 2017-06-04 | Disposition: A | Discharge status: Home / Self Care | Payer: Medicare Other | Source: Ambulatory Visit | Attending: Radiation Oncology | Admitting: Radiation Oncology

## 2017-06-04 ENCOUNTER — Inpatient Hospital Stay: Payer: Medicare Other

## 2017-06-04 ENCOUNTER — Ambulatory Visit: Payer: Medicare Other | Admitting: Nutrition

## 2017-06-04 VITALS — BP 126/56 | HR 72 | Temp 98.2°F | Resp 16 | Wt 103.0 lb

## 2017-06-04 DIAGNOSIS — Z5111 Encounter for antineoplastic chemotherapy: Secondary | ICD-10-CM | POA: Diagnosis not present

## 2017-06-04 DIAGNOSIS — C3492 Malignant neoplasm of unspecified part of left bronchus or lung: Secondary | ICD-10-CM

## 2017-06-04 DIAGNOSIS — C3412 Malignant neoplasm of upper lobe, left bronchus or lung: Secondary | ICD-10-CM | POA: Diagnosis not present

## 2017-06-04 LAB — CBC WITH DIFFERENTIAL/PLATELET
Basophils Absolute: 0 10*3/uL (ref 0.0–0.1)
Basophils Relative: 1 %
EOS PCT: 2 %
Eosinophils Absolute: 0.1 10*3/uL (ref 0.0–0.5)
HEMATOCRIT: 28.8 % — AB (ref 34.8–46.6)
Hemoglobin: 9.4 g/dL — ABNORMAL LOW (ref 11.6–15.9)
LYMPHS ABS: 0.3 10*3/uL — AB (ref 0.9–3.3)
LYMPHS PCT: 10 %
MCH: 27.8 pg (ref 25.1–34.0)
MCHC: 32.8 g/dL (ref 31.5–36.0)
MCV: 84.9 fL (ref 79.5–101.0)
MONO ABS: 0.3 10*3/uL (ref 0.1–0.9)
Monocytes Relative: 10 %
Neutro Abs: 2.5 10*3/uL (ref 1.5–6.5)
Neutrophils Relative %: 77 %
PLATELETS: 251 10*3/uL (ref 145–400)
RBC: 3.39 MIL/uL — ABNORMAL LOW (ref 3.70–5.45)
RDW: 14.4 % (ref 11.2–16.1)
WBC: 3.3 10*3/uL — ABNORMAL LOW (ref 3.9–10.3)

## 2017-06-04 LAB — COMPREHENSIVE METABOLIC PANEL
ALT: 16 U/L (ref 0–55)
AST: 15 U/L (ref 5–34)
Albumin: 2.5 g/dL — ABNORMAL LOW (ref 3.5–5.0)
Alkaline Phosphatase: 113 U/L (ref 40–150)
Anion gap: 8 (ref 3–11)
BUN: 12 mg/dL (ref 7–26)
CHLORIDE: 104 mmol/L (ref 98–109)
CO2: 23 mmol/L (ref 22–29)
CREATININE: 0.78 mg/dL (ref 0.60–1.10)
Calcium: 8.6 mg/dL (ref 8.4–10.4)
Glucose, Bld: 107 mg/dL (ref 70–140)
POTASSIUM: 3.8 mmol/L (ref 3.3–4.7)
Sodium: 135 mmol/L — ABNORMAL LOW (ref 136–145)
TOTAL PROTEIN: 5.8 g/dL — AB (ref 6.4–8.3)

## 2017-06-04 MED ORDER — SODIUM CHLORIDE 0.9 % IV SOLN
Freq: Once | INTRAVENOUS | Status: AC
  Administered 2017-06-04: 09:00:00 via INTRAVENOUS

## 2017-06-04 MED ORDER — PALONOSETRON HCL INJECTION 0.25 MG/5ML
0.2500 mg | Freq: Once | INTRAVENOUS | Status: AC
  Administered 2017-06-04: 0.25 mg via INTRAVENOUS

## 2017-06-04 MED ORDER — DIPHENHYDRAMINE HCL 50 MG/ML IJ SOLN
INTRAMUSCULAR | Status: AC
  Filled 2017-06-04: qty 1

## 2017-06-04 MED ORDER — SODIUM CHLORIDE 0.9 % IV SOLN
45.0000 mg/m2 | Freq: Once | INTRAVENOUS | Status: AC
  Administered 2017-06-04: 72 mg via INTRAVENOUS
  Filled 2017-06-04: qty 12

## 2017-06-04 MED ORDER — PALONOSETRON HCL INJECTION 0.25 MG/5ML
INTRAVENOUS | Status: AC
  Filled 2017-06-04: qty 5

## 2017-06-04 MED ORDER — SODIUM CHLORIDE 0.9 % IV SOLN
20.0000 mg | Freq: Once | INTRAVENOUS | Status: AC
  Administered 2017-06-04: 20 mg via INTRAVENOUS
  Filled 2017-06-04: qty 2

## 2017-06-04 MED ORDER — FAMOTIDINE IN NACL 20-0.9 MG/50ML-% IV SOLN
20.0000 mg | Freq: Once | INTRAVENOUS | Status: AC
  Administered 2017-06-04: 20 mg via INTRAVENOUS

## 2017-06-04 MED ORDER — FAMOTIDINE IN NACL 20-0.9 MG/50ML-% IV SOLN
INTRAVENOUS | Status: AC
Start: 2017-06-04 — End: 2017-06-04
  Filled 2017-06-04: qty 50

## 2017-06-04 MED ORDER — CARBOPLATIN CHEMO INJECTION 450 MG/45ML
122.4000 mg | Freq: Once | INTRAVENOUS | Status: AC
  Administered 2017-06-04: 120 mg via INTRAVENOUS
  Filled 2017-06-04: qty 12

## 2017-06-04 MED ORDER — DIPHENHYDRAMINE HCL 50 MG/ML IJ SOLN
50.0000 mg | Freq: Once | INTRAMUSCULAR | Status: AC
  Administered 2017-06-04: 50 mg via INTRAVENOUS

## 2017-06-04 NOTE — Progress Notes (Signed)
Patient unable to stand on scale without assistance or holding on to the rails.

## 2017-06-04 NOTE — Patient Instructions (Signed)
[facility] Discharge Instructions for Patients Receiving Chemotherapy  Today you received the following chemotherapy agents:  Taxol, Carboplatin  To help prevent nausea and vomiting after your treatment, we encourage you to take your nausea medication as prescribed.   If you develop nausea and vomiting that is not controlled by your nausea medication, call the clinic.   BELOW ARE SYMPTOMS THAT SHOULD BE REPORTED IMMEDIATELY:  *FEVER GREATER THAN 100.5 F  *CHILLS WITH OR WITHOUT FEVER  NAUSEA AND VOMITING THAT IS NOT CONTROLLED WITH YOUR NAUSEA MEDICATION  *UNUSUAL SHORTNESS OF BREATH  *UNUSUAL BRUISING OR BLEEDING  TENDERNESS IN MOUTH AND THROAT WITH OR WITHOUT PRESENCE OF ULCERS  *URINARY PROBLEMS  *BOWEL PROBLEMS  UNUSUAL RASH Items with * indicate a potential emergency and should be followed up as soon as possible.  Feel free to call the clinic should you have any questions or concerns. The clinic phone number is [phone].  Please show the CHEMO ALERT CARD at check-in to the Emergency Department and triage nurse.   

## 2017-06-04 NOTE — Progress Notes (Signed)
MD aware of discrepancy in weight today.  MD ok'd for [place] to use last weeks weight for all doses of chemo today. Kennith Center, Pharm.D., CPP 06/04/2017@9 :26 AM

## 2017-06-04 NOTE — Progress Notes (Signed)
Nutrition follow-up completed with patient during infusion for non-small cell lung cancer. Noted patient weight of 103 pounds January 14.  RN reports this is not accurate secondary to patient holding onto scale. Patient home from nursing home. Patient reports her throat is sore.  She has difficulty swallowing. She also has trouble swallowing pills. Patient has Lidocaine and Carafate on medication list.   Nutrition diagnosis: Unintended weight loss cannot be evaluated.  Intervention: Encouraged patient to utilize medications prior to mealtime. Reviewed high-calorie soft foods with patient. Recommended patient increase boost 3 times a day between meals.  Monitoring, evaluation, goals: Patient will tolerate adequate calories and protein for weight maintenance.  Next visit: Monday, January 21 during infusion.  **Disclaimer: This note was dictated with voice recognition software. Similar sounding words can inadvertently be transcribed and this note may contain transcription errors which may not have been corrected upon publication of note.**

## 2017-06-05 ENCOUNTER — Ambulatory Visit
Admission: RE | Admit: 2017-06-05 | Discharge: 2017-06-05 | Disposition: A | Discharge status: Home / Self Care | Payer: Medicare Other | Source: Ambulatory Visit | Attending: Radiation Oncology | Admitting: Radiation Oncology

## 2017-06-05 DIAGNOSIS — C3412 Malignant neoplasm of upper lobe, left bronchus or lung: Secondary | ICD-10-CM | POA: Diagnosis not present

## 2017-06-06 ENCOUNTER — Ambulatory Visit
Admission: RE | Admit: 2017-06-06 | Discharge: 2017-06-06 | Disposition: A | Discharge status: Home / Self Care | Payer: Medicare Other | Source: Ambulatory Visit | Attending: Radiation Oncology | Admitting: Radiation Oncology

## 2017-06-06 DIAGNOSIS — C3412 Malignant neoplasm of upper lobe, left bronchus or lung: Secondary | ICD-10-CM | POA: Diagnosis not present

## 2017-06-07 ENCOUNTER — Ambulatory Visit
Admission: RE | Admit: 2017-06-07 | Discharge: 2017-06-07 | Disposition: A | Discharge status: Home / Self Care | Payer: Medicare Other | Source: Ambulatory Visit | Attending: Radiation Oncology | Admitting: Radiation Oncology

## 2017-06-07 DIAGNOSIS — C3412 Malignant neoplasm of upper lobe, left bronchus or lung: Secondary | ICD-10-CM | POA: Diagnosis not present

## 2017-06-08 ENCOUNTER — Ambulatory Visit
Admission: RE | Admit: 2017-06-08 | Discharge: 2017-06-08 | Disposition: A | Discharge status: Home / Self Care | Payer: Medicare Other | Source: Ambulatory Visit | Attending: Radiation Oncology | Admitting: Radiation Oncology

## 2017-06-08 DIAGNOSIS — C3412 Malignant neoplasm of upper lobe, left bronchus or lung: Secondary | ICD-10-CM | POA: Diagnosis not present

## 2017-06-08 NOTE — Progress Notes (Addendum)
  Radiation Oncology         [phone] ________________________________  Name: [name] MRN: [id number]  Date: 05/04/2017  DOB: June 04, 1928  SIMULATION AND TREATMENT PLANNING NOTE  DIAGNOSIS:     ICD-10-CM   1. Malignant neoplasm of bronchus of left upper lobe ([place]) C34.12      Site:  chest  NARRATIVE:  The patient was brought to the [facility].  Identity was confirmed.  All relevant records and images related to the planned course of therapy were reviewed.   Written consent to proceed with treatment was confirmed which was freely given after reviewing the details related to the planned course of therapy had been reviewed with the patient.  Then, the patient was set-up in a stable reproducible  supine position for radiation therapy.  CT images were obtained.  Surface markings were placed.    Medically necessary complex treatment device(s) for immobilization:  Vac-lock bag.   The CT images were loaded into the planning software.  Then the target and avoidance structures were contoured.  Treatment planning then occurred.  The radiation prescription was entered and confirmed.  A total of 4 complex treatment devices were fabricated which relate to the designed radiation treatment fields. Additional reduced fields will be used as necessary to improve the dose homogeneity of the plan. Each of these customized fields/ complex treatment devices will be used on a daily basis during the radiation course. I have requested : 3D Simulation  I have requested a DVH of the following structures: target volume, spinal cord, lungs, heart.   The patient will undergo daily image guidance to ensure accurate localization of the target, and adequate minimize dose to the normal surrounding structures in close proximity to the target.  PLAN:  The patient will receive 60 Gy in 30 fractions initially. The patient will then receive a 6 Gy boost for a final dose of 66 Gy.  Special treatment  procedure The patient will also receive concurrent chemotherapy during the treatment. The patient may therefore experience increased toxicity or side effects and the patient will be monitored for such problems. This may require extra lab work as necessary. This therefore constitutes a special treatment procedure.   ________________________________   Jodelle Gross, MD, PhD

## 2017-06-11 ENCOUNTER — Inpatient Hospital Stay: Payer: Medicare Other

## 2017-06-11 ENCOUNTER — Inpatient Hospital Stay (HOSPITAL_BASED_OUTPATIENT_CLINIC_OR_DEPARTMENT_OTHER): Payer: Medicare Other | Admitting: Oncology

## 2017-06-11 ENCOUNTER — Encounter: Payer: Self-pay | Admitting: Oncology

## 2017-06-11 ENCOUNTER — Ambulatory Visit
Admission: RE | Admit: 2017-06-11 | Discharge: 2017-06-11 | Disposition: A | Discharge status: Home / Self Care | Payer: Medicare Other | Source: Ambulatory Visit | Attending: Radiation Oncology | Admitting: Radiation Oncology

## 2017-06-11 ENCOUNTER — Inpatient Hospital Stay: Payer: Medicare Other | Admitting: Nutrition

## 2017-06-11 VITALS — BP 127/58 | HR 87 | Temp 97.4°F | Resp 16 | Ht 61.0 in | Wt 118.8 lb

## 2017-06-11 DIAGNOSIS — R590 Localized enlarged lymph nodes: Secondary | ICD-10-CM | POA: Diagnosis not present

## 2017-06-11 DIAGNOSIS — C3492 Malignant neoplasm of unspecified part of left bronchus or lung: Secondary | ICD-10-CM

## 2017-06-11 DIAGNOSIS — Z5111 Encounter for antineoplastic chemotherapy: Secondary | ICD-10-CM

## 2017-06-11 DIAGNOSIS — Z79899 Other long term (current) drug therapy: Secondary | ICD-10-CM | POA: Diagnosis not present

## 2017-06-11 DIAGNOSIS — C3412 Malignant neoplasm of upper lobe, left bronchus or lung: Secondary | ICD-10-CM | POA: Diagnosis not present

## 2017-06-11 LAB — COMPREHENSIVE METABOLIC PANEL
ALT: 17 U/L (ref 0–55)
AST: 16 U/L (ref 5–34)
Albumin: 2.8 g/dL — ABNORMAL LOW (ref 3.5–5.0)
Alkaline Phosphatase: 110 U/L (ref 40–150)
Anion gap: 10 (ref 3–11)
BUN: 14 mg/dL (ref 7–26)
CHLORIDE: 103 mmol/L (ref 98–109)
CO2: 23 mmol/L (ref 22–29)
CREATININE: 0.79 mg/dL (ref 0.60–1.10)
Calcium: 9 mg/dL (ref 8.4–10.4)
Glucose, Bld: 111 mg/dL (ref 70–140)
POTASSIUM: 3.9 mmol/L (ref 3.3–4.7)
Sodium: 136 mmol/L (ref 136–145)
Total Bilirubin: 0.3 mg/dL (ref 0.2–1.2)
Total Protein: 6.4 g/dL (ref 6.4–8.3)

## 2017-06-11 LAB — CBC WITH DIFFERENTIAL/PLATELET
Basophils Absolute: 0 10*3/uL (ref 0.0–0.1)
Basophils Relative: 1 %
EOS ABS: 0.1 10*3/uL (ref 0.0–0.5)
EOS PCT: 3 %
HCT: 30.4 % — ABNORMAL LOW (ref 34.8–46.6)
Hemoglobin: 10.1 g/dL — ABNORMAL LOW (ref 11.6–15.9)
LYMPHS ABS: 0.4 10*3/uL — AB (ref 0.9–3.3)
LYMPHS PCT: 15 %
MCH: 28.1 pg (ref 25.1–34.0)
MCHC: 33.1 g/dL (ref 31.5–36.0)
MCV: 84.8 fL (ref 79.5–101.0)
MONO ABS: 0.3 10*3/uL (ref 0.1–0.9)
Monocytes Relative: 14 %
Neutro Abs: 1.6 10*3/uL (ref 1.5–6.5)
Neutrophils Relative %: 67 %
PLATELETS: 237 10*3/uL (ref 145–400)
RBC: 3.59 MIL/uL — ABNORMAL LOW (ref 3.70–5.45)
RDW: 14.6 % (ref 11.2–16.1)
WBC: 2.3 10*3/uL — ABNORMAL LOW (ref 3.9–10.3)

## 2017-06-11 MED ORDER — DEXAMETHASONE SODIUM PHOSPHATE 100 MG/10ML IJ SOLN
20.0000 mg | Freq: Once | INTRAMUSCULAR | Status: AC
  Administered 2017-06-11: 20 mg via INTRAVENOUS
  Filled 2017-06-11: qty 2

## 2017-06-11 MED ORDER — FAMOTIDINE IN NACL 20-0.9 MG/50ML-% IV SOLN
INTRAVENOUS | Status: AC
  Filled 2017-06-11: qty 50

## 2017-06-11 MED ORDER — FAMOTIDINE IN NACL 20-0.9 MG/50ML-% IV SOLN
20.0000 mg | Freq: Once | INTRAVENOUS | Status: AC
Start: 2017-06-11 — End: 2017-06-11
  Administered 2017-06-11: 20 mg via INTRAVENOUS

## 2017-06-11 MED ORDER — DIPHENHYDRAMINE HCL 50 MG/ML IJ SOLN
50.0000 mg | Freq: Once | INTRAMUSCULAR | Status: AC
  Administered 2017-06-11: 50 mg via INTRAVENOUS

## 2017-06-11 MED ORDER — DEXAMETHASONE SODIUM PHOSPHATE 10 MG/ML IJ SOLN
INTRAMUSCULAR | Status: AC
  Filled 2017-06-11: qty 1

## 2017-06-11 MED ORDER — SODIUM CHLORIDE 0.9 % IV SOLN
122.4000 mg | Freq: Once | INTRAVENOUS | Status: AC
  Administered 2017-06-11: 120 mg via INTRAVENOUS
  Filled 2017-06-11: qty 12

## 2017-06-11 MED ORDER — PACLITAXEL CHEMO INJECTION 300 MG/50ML
45.0000 mg/m2 | Freq: Once | INTRAVENOUS | Status: AC
  Administered 2017-06-11: 72 mg via INTRAVENOUS
  Filled 2017-06-11: qty 12

## 2017-06-11 MED ORDER — PALONOSETRON HCL INJECTION 0.25 MG/5ML
0.2500 mg | Freq: Once | INTRAVENOUS | Status: AC
  Administered 2017-06-11: 0.25 mg via INTRAVENOUS

## 2017-06-11 MED ORDER — PALONOSETRON HCL INJECTION 0.25 MG/5ML
INTRAVENOUS | Status: AC
  Filled 2017-06-11: qty 5

## 2017-06-11 MED ORDER — DIPHENHYDRAMINE HCL 50 MG/ML IJ SOLN
INTRAMUSCULAR | Status: AC
  Filled 2017-06-11: qty 1

## 2017-06-11 MED ORDER — SODIUM CHLORIDE 0.9 % IV SOLN
Freq: Once | INTRAVENOUS | Status: AC
Start: 2017-06-11 — End: 2017-06-11
  Administered 2017-06-11: 10:00:00 via INTRAVENOUS

## 2017-06-11 NOTE — Patient Instructions (Signed)
[facility] Discharge Instructions for Patients Receiving Chemotherapy  Today you received the following chemotherapy agents:  Taxol, Carboplatin  To help prevent nausea and vomiting after your treatment, we encourage you to take your nausea medication as prescribed.   If you develop nausea and vomiting that is not controlled by your nausea medication, call the clinic.   BELOW ARE SYMPTOMS THAT SHOULD BE REPORTED IMMEDIATELY:  *FEVER GREATER THAN 100.5 F  *CHILLS WITH OR WITHOUT FEVER  NAUSEA AND VOMITING THAT IS NOT CONTROLLED WITH YOUR NAUSEA MEDICATION  *UNUSUAL SHORTNESS OF BREATH  *UNUSUAL BRUISING OR BLEEDING  TENDERNESS IN MOUTH AND THROAT WITH OR WITHOUT PRESENCE OF ULCERS  *URINARY PROBLEMS  *BOWEL PROBLEMS  UNUSUAL RASH Items with * indicate a potential emergency and should be followed up as soon as possible.  Feel free to call the clinic should you have any questions or concerns. The clinic phone number is [phone].  Please show the CHEMO ALERT CARD at check-in to the Emergency Department and triage nurse.   

## 2017-06-11 NOTE — Progress Notes (Signed)
[place] OFFICE PROGRESS NOTE  Leonard Downing, MD [place] [address]  DIAGNOSIS: Stage IIIa (T2b, N2, M0) non-small cell lung cancer, squamous cell carcinoma presented with left upper lobe lung mass left hilar and mediastinal lymphadenopathy diagnosed in November 2018.  PDL 1 less than 1%.  PRIOR THERAPY: None  CURRENT THERAPY: Concurrent chemoradiation with weekly carboplatin for an AUC of 2 with paclitaxel 45 mg/m. First dose 05/21/2017.  Status post 3 cycles.  INTERVAL HISTORY: [name] 84 y.o. female returns for routine follow-up visit accompanied by her family member.  The patient continues to have fatigue, generalized weakness, and odynophagia.  She has been discharged from the rehab facility and is now back at home.  The patient denies fevers and chills.  Denies chest pain, shortness of breath, hemoptysis.  She does have an ongoing nonproductive cough.  Denies nausea, vomiting, constipation, diarrhea.  She has been eating less due to odynophagia and has lost a few pounds since her last visit.  The patient is here for evaluation prior to cycle 4 of her chemotherapy.  MEDICAL HISTORY: Past Medical History:  Diagnosis Date  . Abdominal aortic atherosclerosis ([place])   . CAD (coronary artery disease)   . Carotid artery occlusion   . Common bile duct dilation   . Gallstones   . Headache(784.0)   . Hiatal hernia   . HLD (hyperlipidemia)   . Myocardial infarction ([place])   . PONV (postoperative nausea and vomiting)   . Stroke [facility]) 2003    ALLERGIES:  is allergic to dextroamphetamine sulfate er and other.  MEDICATIONS:  Current Outpatient Medications  Medication Sig Dispense Refill  . ALPRAZolam (XANAX) 0.25 MG tablet Take 0.5 tablets (0.125 mg total) by mouth 2 (two) times daily as needed for anxiety. 10 tablet 0  . aspirin 81 MG EC tablet Take 81 mg by mouth daily.      . feeding supplement, ENSURE ENLIVE, (ENSURE  ENLIVE) LIQD Take 237 mLs 2 (two) times daily between meals by mouth. 30 Bottle 0  . lidocaine (XYLOCAINE) 2 % solution Caregiver: Mix 1part 2% viscous lidocaine, 1part H20. Patient: Swallow 42mL of diluted mixture, 18min before meals and QHS, up to QID PRN to soothe throat. Use if sucralfate is not effective. 100 mL 1  . lisinopril (PRINIVIL,ZESTRIL) 20 MG tablet Take 1 tablet (20 mg total) daily by mouth. 30 tablet 0  . mirtazapine (REMERON) 7.5 MG tablet Take 1 tablet (7.5 mg total) by mouth at bedtime. 30 tablet 0  . polyethylene glycol (MIRALAX / GLYCOLAX) packet Take 17 g daily as needed by mouth. 14 each 0  . prochlorperazine (COMPAZINE) 10 MG tablet Take 1 tablet (10 mg total) by mouth every 6 (six) hours as needed for nausea or vomiting. 30 tablet 0  . sucralfate (CARAFATE) 1 g tablet Dissolve 1 tablet in 10 mL H20 and swallow 20 min prior to meals and bedtime to soothe throat PRN. 30 tablet 1  . Wound Dressings (SONAFINE) Apply 1 application topically 2 (two) times daily.    . Glucosamine-Chondroitin (GLUCOSAMINE CHONDR COMPLEX PO) Take 1 capsule by mouth daily.    Marland Kitchen guaiFENesin (MUCINEX) 600 MG 12 hr tablet Take 2 tablets (1,200 mg total) 2 (two) times daily by mouth. (Patient not taking: Reported on 06/11/2017) 30 tablet 0  . Multiple Vitamin (MULTIVITAMIN WITH MINERALS) TABS tablet Take 1 tablet by mouth at bedtime.    . nitroGLYCERIN (NITROSTAT) 0.4 MG SL tablet Place  0.4 mg under the tongue every 5 (five) minutes as needed for chest pain.     No current facility-administered medications for this visit.     SURGICAL HISTORY:  Past Surgical History:  Procedure Laterality Date  . ABDOMINAL HYSTERECTOMY    . ANKLE FRACTURE SURGERY    . APPENDECTOMY    . BLADDER SURGERY    . CHOLECYSTECTOMY N/A 05/05/2013   Procedure: LAPAROSCOPIC CHOLECYSTECTOMY WITH INTRAOPERATIVE CHOLANGIOGRAM;  Surgeon: Ralene Ok, MD;  Location: [place];  Service: General;  Laterality: N/A;  . HIP  ARTHROPLASTY Right 01/10/2014   Procedure: RIGHT HIP HEMIARTHROPLASTY;  Surgeon: Johnn Hai, MD;  Location: [facility];  Service: Orthopedics;  Laterality: Right;  DEPUY AML LATERAL WITH MARK II  . ROTATOR CUFF REPAIR Left   . TONSILLECTOMY      REVIEW OF SYSTEMS:   Review of Systems  Constitutional: Negative for chills and fever.  Positive for fatigue, decreased appetite, and decrease in weight. HENT:   Negative for mouth sores, nosebleeds, sore throat and trouble swallowing.   Eyes: Negative for eye problems and icterus.  Respiratory: Negative for cough, hemoptysis, shortness of breath and wheezing.   Cardiovascular: Negative for chest pain and leg swelling.  Gastrointestinal: Negative for abdominal pain, constipation, diarrhea, nausea and vomiting.  Positive for pain with swallowing.  Genitourinary: Negative for bladder incontinence, difficulty urinating, dysuria, frequency and hematuria.   Musculoskeletal: Negative for back pain, gait problem, neck pain and neck stiffness.  Skin: Negative for itching and rash.  Neurological: Negative for dizziness, extremity weakness, gait problem, headaches, light-headedness and seizures.  Hematological: Negative for adenopathy. Does not bruise/bleed easily.  Psychiatric/Behavioral: Negative for confusion, depression and sleep disturbance. The patient is not nervous/anxious.     PHYSICAL EXAMINATION:  Blood pressure (!) 127/58, pulse 87, temperature (!) 97.4 F (36.3 C), temperature source Oral, resp. rate 16, height 5\' 1"  (1.549 m), weight 118 lb 12.8 oz (53.9 kg), SpO2 98 %.  ECOG PERFORMANCE STATUS: 1 - Symptomatic but completely ambulatory  Physical Exam  Constitutional: Oriented to person, place, and time and well-developed, well-nourished, and in no distress. No distress.  HENT:  Head: Normocephalic and atraumatic.  Mouth/Throat: Oropharynx is clear and moist. No oropharyngeal exudate.  Eyes: Conjunctivae are normal. Right eye exhibits no  discharge. Left eye exhibits no discharge. No scleral icterus.  Neck: Normal range of motion. Neck supple.  Cardiovascular: Normal rate, regular rhythm, normal heart sounds and intact distal pulses.   Pulmonary/Chest: Effort normal. No respiratory distress. No rales. Positive for expiratory wheezes which clear with cough. Abdominal: Soft. Bowel sounds are normal. Exhibits no distension and no mass. There is no tenderness.  Musculoskeletal: Normal range of motion. Exhibits no edema.  Lymphadenopathy:    No cervical adenopathy.  Neurological: Alert and oriented to person, place, and time. Exhibits normal muscle tone. Coordination normal.  Skin: Skin is warm and dry. No rash noted. Not diaphoretic. No erythema. No pallor.  Psychiatric: Mood, memory and judgment normal.  Vitals reviewed.  LABORATORY DATA: Lab Results  Component Value Date   WBC 2.3 (L) 06/11/2017   HGB 10.1 (L) 06/11/2017   HCT 30.4 (L) 06/11/2017   MCV 84.8 06/11/2017   PLT 237 06/11/2017      Chemistry      Component Value Date/Time   NA 136 06/11/2017 0742   NA 135 (L) 05/21/2017 0829   K 3.9 06/11/2017 0742   K 3.6 05/21/2017 0829   CL 103 06/11/2017 0742  CO2 23 06/11/2017 0742   CO2 27 05/21/2017 0829   BUN 14 06/11/2017 0742   BUN 22.6 05/21/2017 0829   CREATININE 0.79 06/11/2017 0742   CREATININE 1.1 05/21/2017 0829      Component Value Date/Time   CALCIUM 9.0 06/11/2017 0742   CALCIUM 11.3 (H) 05/21/2017 0829   ALKPHOS 110 06/11/2017 0742   ALKPHOS 143 05/21/2017 0829   AST 16 06/11/2017 0742   AST 14 05/21/2017 0829   ALT 17 06/11/2017 0742   ALT 15 05/21/2017 0829   BILITOT 0.3 06/11/2017 0742   BILITOT 0.38 05/21/2017 0829       RADIOGRAPHIC STUDIES:  Dg Chest 2 View  Result Date: 05/14/2017 CLINICAL DATA:  Lung carcinoma EXAM: CHEST  2 VIEW COMPARISON:  Chest radiograph May 10, 2017 and PET-CT April 27, 2017. FINDINGS: The mass in the left apex region with volume loss remain  stable. Lungs elsewhere clear. Heart size and pulmonary vascularity are within normal limits. No adenopathy demonstrated by radiography. There is aortic atherosclerosis. There is calcification in the left carotid artery. No evident bone lesions. IMPRESSION: Mass with volume loss left apex region, grossly stable. No new opacity. Stable cardiac silhouette. There is aortic atherosclerosis as well as calcification in the left carotid artery. Aortic Atherosclerosis (ICD10-I70.0). Electronically Signed   By: Lowella Grip III M.D.   On: 05/14/2017 08:37     ASSESSMENT/PLAN:  Malignant neoplasm of bronchus of left upper lobe [facility]) This is a very pleasant 84 year old white female recently diagnosed with at least stage IIIa (T2b, N2, M0) non-small cell lung cancer, squamous cell carcinoma presented with left upper lobe lung mass left hilar and mediastinal lymphadenopathy diagnosed in November 2018.PDL 1 is less than 1%.  The patient is currently undergoing a course of concurrent chemoradiation with weekly carboplatin for AUC of 2withpaclitaxel 45 mg/M2.Status post 3 cycles which she tolerated well.  Labs were reviewed and are adequate for treatment.  Recommend that she proceed with cycle 4 as scheduled today.  For her odynophagia,, we discussed using her viscous lidocaine and Carafate routinely.  For her weight loss, I have encouraged her to continue boost or Ensure.  She will meet with our dietitian later today.  The patient was advised to call immediately if she has any concerning symptoms in the interval. The patient voices understanding of current disease status and treatment options and is in agreement with the current care plan.  All questions were answered. The patient knows to call the clinic with any problems, questions or concerns. We can certainly see the patient much sooner if necessary.  No orders of the defined types were placed in this encounter.   Mikey Bussing, DNP,  AGPCNP-BC, AOCNP 06/11/17

## 2017-06-11 NOTE — Progress Notes (Signed)
Nutrition follow-up with patient during infusion for non-small cell lung cancer. Weight documented as 118.8 pounds January 21.   Noted albumin 2.8. Patient continues to report that her throat is very sore and she has difficulty swallowing. She continues to have difficulty swallowing pills. Reports lidocaine, and Carafate does not help much.  Nutrition diagnosis: Unintended weight loss continues.  Intervention: Again reviewed importance of smaller more frequent meals and snacks with high-calorie, high-protein foods. Encouraged increased intake of oral nutrition supplements. Reviewed options for increasing calorie intake utilizing foods patient reports she tolerates. Provided coupons. Questions were answered.  Teach back method used.  Monitoring, evaluation, goals: Patient will tolerate increased calories and protein for weight stabilization.  Next visit: Monday, January 28, during infusion.  **Disclaimer: This note was dictated with voice recognition software. Similar sounding words can inadvertently be transcribed and this note may contain transcription errors which may not have been corrected upon publication of note.**

## 2017-06-11 NOTE — Assessment & Plan Note (Signed)
This is a very pleasant 84 year old white female recently diagnosed with at least stage IIIa (T2b, N2, M0) non-small cell lung cancer, squamous cell carcinoma presented with left upper lobe lung mass left hilar and mediastinal lymphadenopathy diagnosed in November 2018.PDL 1 is less than 1%.  The patient is currently undergoing a course of concurrent chemoradiation with weekly carboplatin for AUC of 2withpaclitaxel 45 mg/M2.Status post 3 cycles which she tolerated well.  Labs were reviewed and are adequate for treatment.  Recommend that she proceed with cycle 4 as scheduled today.  For her odynophagia,, we discussed using her viscous lidocaine and Carafate routinely.  For her weight loss, I have encouraged her to continue boost or Ensure.  She will meet with our dietitian later today.  The patient was advised to call immediately if she has any concerning symptoms in the interval. The patient voices understanding of current disease status and treatment options and is in agreement with the current care plan.  All questions were answered. The patient knows to call the clinic with any problems, questions or concerns. We can certainly see the patient much sooner if necessary.

## 2017-06-12 ENCOUNTER — Ambulatory Visit
Admission: RE | Admit: 2017-06-12 | Discharge: 2017-06-12 | Disposition: A | Discharge status: Home / Self Care | Payer: Medicare Other | Source: Ambulatory Visit | Attending: Radiation Oncology | Admitting: Radiation Oncology

## 2017-06-12 DIAGNOSIS — C3412 Malignant neoplasm of upper lobe, left bronchus or lung: Secondary | ICD-10-CM | POA: Diagnosis not present

## 2017-06-13 ENCOUNTER — Ambulatory Visit
Admission: RE | Admit: 2017-06-13 | Discharge: 2017-06-13 | Disposition: A | Discharge status: Home / Self Care | Payer: Medicare Other | Source: Ambulatory Visit | Attending: Radiation Oncology | Admitting: Radiation Oncology

## 2017-06-13 ENCOUNTER — Telehealth: Payer: Self-pay | Admitting: Oncology

## 2017-06-13 DIAGNOSIS — C3412 Malignant neoplasm of upper lobe, left bronchus or lung: Secondary | ICD-10-CM | POA: Diagnosis not present

## 2017-06-13 NOTE — Telephone Encounter (Signed)
No additional appt per 1/21 los - appts already scheduled. -

## 2017-06-14 ENCOUNTER — Ambulatory Visit
Admission: RE | Admit: 2017-06-14 | Discharge: 2017-06-14 | Disposition: A | Discharge status: Home / Self Care | Payer: Medicare Other | Source: Ambulatory Visit | Attending: Radiation Oncology | Admitting: Radiation Oncology

## 2017-06-14 DIAGNOSIS — C3412 Malignant neoplasm of upper lobe, left bronchus or lung: Secondary | ICD-10-CM | POA: Diagnosis not present

## 2017-06-15 ENCOUNTER — Ambulatory Visit
Admission: RE | Admit: 2017-06-15 | Discharge: 2017-06-15 | Disposition: A | Discharge status: Home / Self Care | Payer: Medicare Other | Source: Ambulatory Visit | Attending: Radiation Oncology | Admitting: Radiation Oncology

## 2017-06-15 DIAGNOSIS — C3412 Malignant neoplasm of upper lobe, left bronchus or lung: Secondary | ICD-10-CM | POA: Diagnosis not present

## 2017-06-18 ENCOUNTER — Inpatient Hospital Stay: Payer: Medicare Other

## 2017-06-18 ENCOUNTER — Inpatient Hospital Stay: Payer: Medicare Other | Admitting: Nutrition

## 2017-06-18 ENCOUNTER — Ambulatory Visit
Admission: RE | Admit: 2017-06-18 | Discharge: 2017-06-18 | Disposition: A | Discharge status: Home / Self Care | Payer: Medicare Other | Source: Ambulatory Visit | Attending: Radiation Oncology | Admitting: Radiation Oncology

## 2017-06-18 VITALS — BP 96/58 | HR 90 | Temp 97.9°F | Resp 16

## 2017-06-18 DIAGNOSIS — C3412 Malignant neoplasm of upper lobe, left bronchus or lung: Secondary | ICD-10-CM

## 2017-06-18 DIAGNOSIS — C3492 Malignant neoplasm of unspecified part of left bronchus or lung: Secondary | ICD-10-CM

## 2017-06-18 DIAGNOSIS — Z5111 Encounter for antineoplastic chemotherapy: Secondary | ICD-10-CM | POA: Diagnosis not present

## 2017-06-18 LAB — CBC WITH DIFFERENTIAL/PLATELET
BASOS ABS: 0 10*3/uL (ref 0.0–0.1)
Basophils Relative: 1 %
EOS ABS: 0 10*3/uL (ref 0.0–0.5)
EOS PCT: 2 %
HCT: 30.6 % — ABNORMAL LOW (ref 34.8–46.6)
Hemoglobin: 10 g/dL — ABNORMAL LOW (ref 11.6–15.9)
Lymphocytes Relative: 15 %
Lymphs Abs: 0.4 10*3/uL — ABNORMAL LOW (ref 0.9–3.3)
MCH: 28.1 pg (ref 25.1–34.0)
MCHC: 32.7 g/dL (ref 31.5–36.0)
MCV: 86 fL (ref 79.5–101.0)
Monocytes Absolute: 0.4 10*3/uL (ref 0.1–0.9)
Monocytes Relative: 14 %
Neutro Abs: 1.7 10*3/uL (ref 1.5–6.5)
Neutrophils Relative %: 68 %
PLATELETS: 179 10*3/uL (ref 145–400)
RBC: 3.56 MIL/uL — AB (ref 3.70–5.45)
RDW: 15.5 % (ref 11.2–16.1)
WBC: 2.5 10*3/uL — AB (ref 3.9–10.3)

## 2017-06-18 LAB — COMPREHENSIVE METABOLIC PANEL
ALT: 16 U/L (ref 0–55)
AST: 18 U/L (ref 5–34)
Albumin: 2.9 g/dL — ABNORMAL LOW (ref 3.5–5.0)
Alkaline Phosphatase: 120 U/L (ref 40–150)
Anion gap: 9 (ref 3–11)
BUN: 17 mg/dL (ref 7–26)
CHLORIDE: 104 mmol/L (ref 98–109)
CO2: 23 mmol/L (ref 22–29)
CREATININE: 0.84 mg/dL (ref 0.60–1.10)
Calcium: 9.3 mg/dL (ref 8.4–10.4)
GFR calc Af Amer: >60 mL/min (ref >60)
GFR calc non Af Amer: >60 mL/min (ref >60)
Glucose, Bld: 127 mg/dL (ref 70–140)
POTASSIUM: 4.2 mmol/L (ref 3.3–4.7)
SODIUM: 136 mmol/L (ref 136–145)
Total Bilirubin: 0.5 mg/dL (ref 0.2–1.2)
Total Protein: 6.7 g/dL (ref 6.4–8.3)

## 2017-06-18 MED ORDER — SODIUM CHLORIDE 0.9 % IV SOLN
Freq: Once | INTRAVENOUS | Status: AC
  Administered 2017-06-18: 10:00:00 via INTRAVENOUS

## 2017-06-18 MED ORDER — DIPHENHYDRAMINE HCL 50 MG/ML IJ SOLN
INTRAMUSCULAR | Status: AC
  Filled 2017-06-18: qty 1

## 2017-06-18 MED ORDER — SODIUM CHLORIDE 0.9 % IV SOLN
45.0000 mg/m2 | Freq: Once | INTRAVENOUS | Status: AC
  Administered 2017-06-18: 72 mg via INTRAVENOUS
  Filled 2017-06-18: qty 12

## 2017-06-18 MED ORDER — FAMOTIDINE IN NACL 20-0.9 MG/50ML-% IV SOLN
INTRAVENOUS | Status: AC
  Filled 2017-06-18: qty 50

## 2017-06-18 MED ORDER — FAMOTIDINE IN NACL 20-0.9 MG/50ML-% IV SOLN
20.0000 mg | Freq: Once | INTRAVENOUS | Status: AC
  Administered 2017-06-18: 20 mg via INTRAVENOUS

## 2017-06-18 MED ORDER — PALONOSETRON HCL INJECTION 0.25 MG/5ML
INTRAVENOUS | Status: AC
  Filled 2017-06-18: qty 5

## 2017-06-18 MED ORDER — DEXAMETHASONE SODIUM PHOSPHATE 100 MG/10ML IJ SOLN
20.0000 mg | Freq: Once | INTRAMUSCULAR | Status: AC
  Administered 2017-06-18: 20 mg via INTRAVENOUS
  Filled 2017-06-18: qty 2

## 2017-06-18 MED ORDER — PALONOSETRON HCL INJECTION 0.25 MG/5ML
0.2500 mg | Freq: Once | INTRAVENOUS | Status: AC
  Administered 2017-06-18: 0.25 mg via INTRAVENOUS

## 2017-06-18 MED ORDER — SODIUM CHLORIDE 0.9 % IV SOLN
122.4000 mg | Freq: Once | INTRAVENOUS | Status: AC
  Administered 2017-06-18: 120 mg via INTRAVENOUS
  Filled 2017-06-18: qty 12

## 2017-06-18 MED ORDER — DIPHENHYDRAMINE HCL 50 MG/ML IJ SOLN
50.0000 mg | Freq: Once | INTRAMUSCULAR | Status: AC
  Administered 2017-06-18: 50 mg via INTRAVENOUS

## 2017-06-18 NOTE — Progress Notes (Signed)
Nutrition follow-up completed with patient during infusion for non-small cell lung cancer. Last weight documented 118.8 pounds January 21. Patient reports that her throat continues to get very sore and she continues to have difficulty swallowing. Medications seem not to help much.  Nutrition diagnosis: Unintended weight loss continues.  Intervention: Reviewed soft moist foods and encourage patient to increase oral intake. Recommended patient increase oral nutrition supplements. Recommended increased fluid intake. Questions answered.  Teach back method used.  Monitoring, evaluation, goals: Patient will tolerate increased calories and protein to minimize further weight loss.  Next visit: To be scheduled as needed.  Patient has my contact information for questions.  **Disclaimer: This note was dictated with voice recognition software. Similar sounding words can inadvertently be transcribed and this note may contain transcription errors which may not have been corrected upon publication of note.**

## 2017-06-18 NOTE — Patient Instructions (Signed)
[facility] Discharge Instructions for Patients Receiving Chemotherapy  Today you received the following chemotherapy agents: Carboplatin and Taxol  To help prevent nausea and vomiting after your treatment, we encourage you to take your nausea medication as directed   If you develop nausea and vomiting that is not controlled by your nausea medication, call the clinic.   BELOW ARE SYMPTOMS THAT SHOULD BE REPORTED IMMEDIATELY:  *FEVER GREATER THAN 100.5 F  *CHILLS WITH OR WITHOUT FEVER  NAUSEA AND VOMITING THAT IS NOT CONTROLLED WITH YOUR NAUSEA MEDICATION  *UNUSUAL SHORTNESS OF BREATH  *UNUSUAL BRUISING OR BLEEDING  TENDERNESS IN MOUTH AND THROAT WITH OR WITHOUT PRESENCE OF ULCERS  *URINARY PROBLEMS  *BOWEL PROBLEMS  UNUSUAL RASH Items with * indicate a potential emergency and should be followed up as soon as possible.  Feel free to call the clinic should you have any questions or concerns. The clinic phone number is [phone].  Please show the CHEMO ALERT CARD at check-in to the Emergency Department and triage nurse.   

## 2017-06-19 ENCOUNTER — Ambulatory Visit
Admission: RE | Admit: 2017-06-19 | Discharge: 2017-06-19 | Disposition: A | Discharge status: Home / Self Care | Payer: Medicare Other | Source: Ambulatory Visit | Attending: Radiation Oncology | Admitting: Radiation Oncology

## 2017-06-19 DIAGNOSIS — C3412 Malignant neoplasm of upper lobe, left bronchus or lung: Secondary | ICD-10-CM | POA: Diagnosis not present

## 2017-06-20 ENCOUNTER — Ambulatory Visit
Admission: RE | Admit: 2017-06-20 | Discharge: 2017-06-20 | Disposition: A | Discharge status: Home / Self Care | Payer: Medicare Other | Source: Ambulatory Visit | Attending: Radiation Oncology | Admitting: Radiation Oncology

## 2017-06-20 DIAGNOSIS — C3412 Malignant neoplasm of upper lobe, left bronchus or lung: Secondary | ICD-10-CM | POA: Diagnosis not present

## 2017-06-21 ENCOUNTER — Ambulatory Visit
Admission: RE | Admit: 2017-06-21 | Discharge: 2017-06-21 | Disposition: A | Discharge status: Home / Self Care | Payer: Medicare Other | Source: Ambulatory Visit | Attending: Radiation Oncology | Admitting: Radiation Oncology

## 2017-06-21 DIAGNOSIS — C3412 Malignant neoplasm of upper lobe, left bronchus or lung: Secondary | ICD-10-CM | POA: Diagnosis not present

## 2017-06-22 ENCOUNTER — Ambulatory Visit
Admission: RE | Admit: 2017-06-22 | Discharge: 2017-06-22 | Disposition: A | Discharge status: Home / Self Care | Payer: Medicare Other | Source: Ambulatory Visit | Attending: Radiation Oncology | Admitting: Radiation Oncology

## 2017-06-22 DIAGNOSIS — C3412 Malignant neoplasm of upper lobe, left bronchus or lung: Secondary | ICD-10-CM | POA: Diagnosis not present

## 2017-06-25 ENCOUNTER — Inpatient Hospital Stay: Payer: Medicare Other

## 2017-06-25 ENCOUNTER — Ambulatory Visit
Admission: RE | Admit: 2017-06-25 | Discharge: 2017-06-25 | Disposition: A | Discharge status: Home / Self Care | Payer: Medicare Other | Source: Ambulatory Visit | Attending: Radiation Oncology | Admitting: Radiation Oncology

## 2017-06-25 ENCOUNTER — Inpatient Hospital Stay: Payer: Medicare Other | Attending: Internal Medicine | Admitting: Internal Medicine

## 2017-06-25 ENCOUNTER — Encounter: Payer: Self-pay | Admitting: Internal Medicine

## 2017-06-25 ENCOUNTER — Telehealth: Payer: Self-pay | Admitting: Internal Medicine

## 2017-06-25 VITALS — BP 112/54 | HR 94 | Temp 97.6°F | Resp 16 | Ht 61.0 in | Wt 116.0 lb

## 2017-06-25 DIAGNOSIS — R1310 Dysphagia, unspecified: Secondary | ICD-10-CM | POA: Diagnosis not present

## 2017-06-25 DIAGNOSIS — C3412 Malignant neoplasm of upper lobe, left bronchus or lung: Secondary | ICD-10-CM | POA: Diagnosis not present

## 2017-06-25 DIAGNOSIS — M4854XA Collapsed vertebra, not elsewhere classified, thoracic region, initial encounter for fracture: Secondary | ICD-10-CM | POA: Insufficient documentation

## 2017-06-25 DIAGNOSIS — Z7982 Long term (current) use of aspirin: Secondary | ICD-10-CM | POA: Diagnosis not present

## 2017-06-25 DIAGNOSIS — Z79899 Other long term (current) drug therapy: Secondary | ICD-10-CM

## 2017-06-25 DIAGNOSIS — Z8673 Personal history of transient ischemic attack (TIA), and cerebral infarction without residual deficits: Secondary | ICD-10-CM | POA: Insufficient documentation

## 2017-06-25 DIAGNOSIS — M545 Low back pain: Secondary | ICD-10-CM | POA: Insufficient documentation

## 2017-06-25 DIAGNOSIS — E785 Hyperlipidemia, unspecified: Secondary | ICD-10-CM | POA: Insufficient documentation

## 2017-06-25 DIAGNOSIS — I2510 Atherosclerotic heart disease of native coronary artery without angina pectoris: Secondary | ICD-10-CM | POA: Diagnosis not present

## 2017-06-25 DIAGNOSIS — Z923 Personal history of irradiation: Secondary | ICD-10-CM | POA: Insufficient documentation

## 2017-06-25 DIAGNOSIS — K449 Diaphragmatic hernia without obstruction or gangrene: Secondary | ICD-10-CM | POA: Insufficient documentation

## 2017-06-25 DIAGNOSIS — I252 Old myocardial infarction: Secondary | ICD-10-CM | POA: Diagnosis not present

## 2017-06-25 DIAGNOSIS — I6529 Occlusion and stenosis of unspecified carotid artery: Secondary | ICD-10-CM | POA: Insufficient documentation

## 2017-06-25 DIAGNOSIS — C3490 Malignant neoplasm of unspecified part of unspecified bronchus or lung: Secondary | ICD-10-CM

## 2017-06-25 DIAGNOSIS — C3492 Malignant neoplasm of unspecified part of left bronchus or lung: Secondary | ICD-10-CM

## 2017-06-25 DIAGNOSIS — Z5111 Encounter for antineoplastic chemotherapy: Secondary | ICD-10-CM

## 2017-06-25 DIAGNOSIS — R5383 Other fatigue: Secondary | ICD-10-CM | POA: Diagnosis not present

## 2017-06-25 LAB — COMPREHENSIVE METABOLIC PANEL
ALK PHOS: 119 U/L (ref 40–150)
ALT: 12 U/L (ref 0–55)
ANION GAP: 7 (ref 3–11)
AST: 15 U/L (ref 5–34)
Albumin: 2.8 g/dL — ABNORMAL LOW (ref 3.5–5.0)
BUN: 9 mg/dL (ref 7–26)
CALCIUM: 8.7 mg/dL (ref 8.4–10.4)
CHLORIDE: 102 mmol/L (ref 98–109)
CO2: 24 mmol/L (ref 22–29)
CREATININE: 0.74 mg/dL (ref 0.60–1.10)
Glucose, Bld: 121 mg/dL (ref 70–140)
Potassium: 3.9 mmol/L (ref 3.5–5.1)
SODIUM: 133 mmol/L — AB (ref 136–145)
Total Bilirubin: 0.5 mg/dL (ref 0.2–1.2)
Total Protein: 6 g/dL — ABNORMAL LOW (ref 6.4–8.3)

## 2017-06-25 LAB — CBC WITH DIFFERENTIAL/PLATELET
Basophils Absolute: 0 10*3/uL (ref 0.0–0.1)
Basophils Relative: 1 %
EOS ABS: 0 10*3/uL (ref 0.0–0.5)
EOS PCT: 1 %
HCT: 27.2 % — ABNORMAL LOW (ref 34.8–46.6)
Hemoglobin: 9 g/dL — ABNORMAL LOW (ref 11.6–15.9)
LYMPHS ABS: 0.3 10*3/uL — AB (ref 0.9–3.3)
LYMPHS PCT: 16 %
MCH: 28.4 pg (ref 25.1–34.0)
MCHC: 33.1 g/dL (ref 31.5–36.0)
MCV: 85.8 fL (ref 79.5–101.0)
MONO ABS: 0.2 10*3/uL (ref 0.1–0.9)
Monocytes Relative: 13 %
Neutro Abs: 1.3 10*3/uL — ABNORMAL LOW (ref 1.5–6.5)
Neutrophils Relative %: 69 %
PLATELETS: 162 10*3/uL (ref 145–400)
RBC: 3.17 MIL/uL — AB (ref 3.70–5.45)
RDW: 16.1 % — ABNORMAL HIGH (ref 11.2–14.5)
WBC: 1.9 10*3/uL — ABNORMAL LOW (ref 3.9–10.3)

## 2017-06-25 MED ORDER — SUCRALFATE 1 GM/10ML PO SUSP: 1.0000 g | Freq: Three times a day (TID) | ORAL | 0 refills | Status: DC

## 2017-06-25 NOTE — Progress Notes (Signed)
[place] Telephone:([phone]   [phone]) [phone]  OFFICE PROGRESS NOTE  Leonard Downing, MD [place] [address]  DIAGNOSIS: Stage IIIA (T2b, N2, M0) non-small cell lung cancer, squamous cell carcinoma presented with left upper lobe lung mass left hilar and mediastinal lymphadenopathy diagnosed in November 2018. PDL 1 less than 1%.  PRIOR THERAPY: None.  CURRENT THERAPY: Concurrent chemoradiation with weekly carboplatin for AUC of 2 and paclitaxel 45 mg/M2.  First dose May 21, 2017, status post 5 cycles.  Last dose was given on June 18, 2017.  INTERVAL HISTORY: [name] 84 y.o. female returns to the clinic today for follow-up visit accompanied by her daughter.  The patient is complaining of increasing fatigue and weakness as well as significant odynophagia.  She has difficulty swallowing anything except water.  She has been on Viscous Lidocaine with some improvement.  She lost several pounds since her last visit.  She denied having any current chest pain, shortness breath, cough or hemoptysis.  She denied having any fever or chills.  She has no nausea, vomiting, diarrhea or constipation.  She is here today for evaluation before starting cycle #6 of her systemic chemotherapy.  MEDICAL HISTORY: Past Medical History:  Diagnosis Date  . Abdominal aortic atherosclerosis ([place])   . CAD (coronary artery disease)   . Carotid artery occlusion   . Common bile duct dilation   . Gallstones   . Headache(784.0)   . Hiatal hernia   . HLD (hyperlipidemia)   . Myocardial infarction ([place])   . PONV (postoperative nausea and vomiting)   . Stroke [facility]) 2003    ALLERGIES:  is allergic to dextroamphetamine sulfate er and other.  MEDICATIONS:  Current Outpatient Medications  Medication Sig Dispense Refill  . ALPRAZolam (XANAX) 0.25 MG tablet Take 0.5 tablets (0.125 mg total) by mouth 2 (two) times daily as needed for anxiety.  10 tablet 0  . aspirin 81 MG EC tablet Take 81 mg by mouth daily.      . feeding supplement, ENSURE ENLIVE, (ENSURE ENLIVE) LIQD Take 237 mLs 2 (two) times daily between meals by mouth. 30 Bottle 0  . Glucosamine-Chondroitin (GLUCOSAMINE CHONDR COMPLEX PO) Take 1 capsule by mouth daily.    Marland Kitchen guaiFENesin (MUCINEX) 600 MG 12 hr tablet Take 2 tablets (1,200 mg total) 2 (two) times daily by mouth. (Patient not taking: Reported on 06/11/2017) 30 tablet 0  . lidocaine (XYLOCAINE) 2 % solution Caregiver: Mix 1part 2% viscous lidocaine, 1part H20. Patient: Swallow 33mL of diluted mixture, 41min before meals and QHS, up to QID PRN to soothe throat. Use if sucralfate is not effective. 100 mL 1  . lisinopril (PRINIVIL,ZESTRIL) 20 MG tablet Take 1 tablet (20 mg total) daily by mouth. 30 tablet 0  . mirtazapine (REMERON) 7.5 MG tablet Take 1 tablet (7.5 mg total) by mouth at bedtime. 30 tablet 0  . Multiple Vitamin (MULTIVITAMIN WITH MINERALS) TABS tablet Take 1 tablet by mouth at bedtime.    . nitroGLYCERIN (NITROSTAT) 0.4 MG SL tablet Place 0.4 mg under the tongue every 5 (five) minutes as needed for chest pain.    . polyethylene glycol (MIRALAX / GLYCOLAX) packet Take 17 g daily as needed by mouth. 14 each 0  . prochlorperazine (COMPAZINE) 10 MG tablet Take 1 tablet (10 mg total) by mouth every 6 (six) hours as needed for nausea or vomiting. 30 tablet 0  . sucralfate (CARAFATE) 1 g tablet Dissolve  1 tablet in 10 mL H20 and swallow 20 min prior to meals and bedtime to soothe throat PRN. 30 tablet 1  . Wound Dressings (SONAFINE) Apply 1 application topically 2 (two) times daily.     No current facility-administered medications for this visit.     SURGICAL HISTORY:  Past Surgical History:  Procedure Laterality Date  . ABDOMINAL HYSTERECTOMY    . ANKLE FRACTURE SURGERY    . APPENDECTOMY    . BLADDER SURGERY    . CHOLECYSTECTOMY N/A 05/05/2013   Procedure: LAPAROSCOPIC CHOLECYSTECTOMY WITH INTRAOPERATIVE  CHOLANGIOGRAM;  Surgeon: Ralene Ok, MD;  Location: [place];  Service: General;  Laterality: N/A;  . HIP ARTHROPLASTY Right 01/10/2014   Procedure: RIGHT HIP HEMIARTHROPLASTY;  Surgeon: Johnn Hai, MD;  Location[facility];  Service: Orthopedics;  Laterality: Right;  DEPUY AML LATERAL WITH MARK II  . ROTATOR CUFF REPAIR Left   . TONSILLECTOMY      REVIEW OF SYSTEMS:  A comprehensive review of systems was negative except for: Constitutional: positive for fatigue and weight loss Gastrointestinal: positive for odynophagia   PHYSICAL EXAMINATION: General appearance: alert, cooperative, fatigued and no distress Head: Normocephalic, without obvious abnormality, atraumatic Neck: no adenopathy, no JVD, supple, symmetrical, trachea midline and thyroid not enlarged, symmetric, no tenderness/mass/nodules Lymph nodes: Cervical, supraclavicular, and axillary nodes normal. Resp: clear to auscultation bilaterally Back: symmetric, no curvature. ROM normal. No CVA tenderness. Cardio: regular rate and rhythm, S1, S2 normal, no murmur, click, rub or gallop GI: soft, non-tender; bowel sounds normal; no masses,  no organomegaly Extremities: extremities normal, atraumatic, no cyanosis or edema  ECOG PERFORMANCE STATUS: 1 - Symptomatic but completely ambulatory  Blood pressure (!) 112/54, pulse 94, temperature 97.6 F (36.4 C), temperature source Oral, resp. rate 16, height 5\' 1"  (1.549 m), weight 116 lb (52.6 kg), SpO2 98 %.  LABORATORY DATA: Lab Results  Component Value Date   WBC 1.9 (L) 06/25/2017   HGB 9.0 (L) 06/25/2017   HCT 27.2 (L) 06/25/2017   MCV 85.8 06/25/2017   PLT 162 06/25/2017      Chemistry      Component Value Date/Time   NA 136 06/18/2017 0850   NA 135 (L) 05/21/2017 0829   K 4.2 06/18/2017 0850   K 3.6 05/21/2017 0829   CL 104 06/18/2017 0850   CO2 23 06/18/2017 0850   CO2 27 05/21/2017 0829   BUN 17 06/18/2017 0850   BUN 22.6 05/21/2017 0829   CREATININE 0.84  06/18/2017 0850   CREATININE 1.1 05/21/2017 0829      Component Value Date/Time   CALCIUM 9.3 06/18/2017 0850   CALCIUM 11.3 (H) 05/21/2017 0829   ALKPHOS 120 06/18/2017 0850   ALKPHOS 143 05/21/2017 0829   AST 18 06/18/2017 0850   AST 14 05/21/2017 0829   ALT 16 06/18/2017 0850   ALT 15 05/21/2017 0829   BILITOT 0.5 06/18/2017 0850   BILITOT 0.38 05/21/2017 0829       RADIOGRAPHIC STUDIES: No results found.  ASSESSMENT AND PLAN: This is a very pleasant 84 years old white female with a stage IIIa non-small cell lung cancer, squamous cell carcinoma.  She is currently undergoing a course of concurrent chemoradiation with weekly carboplatin and paclitaxel status post 5 cycles.  She has been tolerating this treatment well except for the significant odynophagia secondary to radiation induced esophagitis. Her total white blood count and absolute neutrophil count are low today.  I recommended for the patient to discontinue her current  weekly chemotherapy for now.  She is expected to complete the course of radiotherapy on July 02, 2017. I will arrange for the patient to have repeat CT scan of the chest in 4 weeks for restaging of her disease. For the odynophagia, I recommended for the patient to continue the viscous lidocaine.  I would also start the patient on Carafate oral suspension 4 times a day. She was advised to call immediately if she has any concerning symptoms in the interval. The patient voices understanding of current disease status and treatment options and is in agreement with the current care plan.  All questions were answered. The patient knows to call the clinic with any problems, questions or concerns. We can certainly see the patient much sooner if necessary.  I spent 10 minutes counseling the patient face to face. The total time spent in the appointment was 15 minutes.  Disclaimer: This note was dictated with voice recognition software. Similar sounding words can  inadvertently be transcribed and may not be corrected upon review.

## 2017-06-25 NOTE — Telephone Encounter (Signed)
Scheduled appt per 2/4 los - Gave patient AVS and calender per los. Central radiology to contact patient with ct schedule.

## 2017-06-26 ENCOUNTER — Ambulatory Visit
Admission: RE | Admit: 2017-06-26 | Discharge: 2017-06-26 | Disposition: A | Discharge status: Home / Self Care | Payer: Medicare Other | Source: Ambulatory Visit | Attending: Radiation Oncology | Admitting: Radiation Oncology

## 2017-06-26 DIAGNOSIS — C3412 Malignant neoplasm of upper lobe, left bronchus or lung: Secondary | ICD-10-CM | POA: Diagnosis not present

## 2017-06-27 ENCOUNTER — Ambulatory Visit
Admission: RE | Admit: 2017-06-27 | Discharge: 2017-06-27 | Disposition: A | Discharge status: Home / Self Care | Payer: Medicare Other | Source: Ambulatory Visit | Attending: Radiation Oncology | Admitting: Radiation Oncology

## 2017-06-27 DIAGNOSIS — C3412 Malignant neoplasm of upper lobe, left bronchus or lung: Secondary | ICD-10-CM | POA: Diagnosis not present

## 2017-06-28 ENCOUNTER — Ambulatory Visit
Admission: RE | Admit: 2017-06-28 | Discharge: 2017-06-28 | Disposition: A | Discharge status: Home / Self Care | Payer: Medicare Other | Source: Ambulatory Visit | Attending: Radiation Oncology | Admitting: Radiation Oncology

## 2017-06-28 DIAGNOSIS — C3412 Malignant neoplasm of upper lobe, left bronchus or lung: Secondary | ICD-10-CM | POA: Diagnosis not present

## 2017-06-29 ENCOUNTER — Ambulatory Visit
Admission: RE | Admit: 2017-06-29 | Discharge: 2017-06-29 | Disposition: A | Discharge status: Home / Self Care | Payer: Medicare Other | Source: Ambulatory Visit | Attending: Radiation Oncology | Admitting: Radiation Oncology

## 2017-06-29 ENCOUNTER — Ambulatory Visit: Payer: Medicare Other

## 2017-06-29 DIAGNOSIS — C3412 Malignant neoplasm of upper lobe, left bronchus or lung: Secondary | ICD-10-CM

## 2017-06-29 MED ORDER — RADIAPLEXRX EX GEL: Freq: Two times a day (BID) | CUTANEOUS | Status: DC

## 2017-06-29 MED ORDER — ALRA NON-METALLIC DEODORANT (RAD-ONC): 1.0000 "application " | Freq: Once | TOPICAL | Status: DC

## 2017-07-02 ENCOUNTER — Other Ambulatory Visit: Payer: Medicare Other

## 2017-07-02 ENCOUNTER — Ambulatory Visit
Admission: RE | Admit: 2017-07-02 | Discharge: 2017-07-02 | Disposition: A | Discharge status: Home / Self Care | Payer: Medicare Other | Source: Ambulatory Visit | Attending: Radiation Oncology | Admitting: Radiation Oncology

## 2017-07-02 ENCOUNTER — Ambulatory Visit: Payer: Medicare Other

## 2017-07-02 DIAGNOSIS — C3412 Malignant neoplasm of upper lobe, left bronchus or lung: Secondary | ICD-10-CM | POA: Diagnosis not present

## 2017-07-06 ENCOUNTER — Other Ambulatory Visit: Payer: Self-pay | Admitting: *Deleted

## 2017-07-06 ENCOUNTER — Telehealth: Payer: Self-pay | Admitting: *Deleted

## 2017-07-06 DIAGNOSIS — C3412 Malignant neoplasm of upper lobe, left bronchus or lung: Secondary | ICD-10-CM

## 2017-07-06 NOTE — Telephone Encounter (Signed)
Patient's granddaughter [name] called requesting a home healthcare for daily living activities (bathing/dressing, tolieting, etc).  Per Erasmo Downer, NP order for home health evaluation.

## 2017-07-06 NOTE — Progress Notes (Signed)
Patient's daughter [name] called requesting home health. Patient requires assistance with daily living activities (bathing/dressing, assistance to bathroom, etc) during the day when caregivers are at work.  Per Erasmo Downer, NP order placed for home health evaluation.

## 2017-07-10 ENCOUNTER — Encounter: Payer: Self-pay | Admitting: Radiation Oncology

## 2017-07-10 NOTE — Progress Notes (Signed)
  Radiation Oncology         [phone] ________________________________  Name: [name] MRN: [id number]  Date: 07/10/2017  DOB: January 26, 1929  End of Treatment Note  Diagnosis: Stage IIIB, cT4N2M0 NSCLC, squamous cell carcinoma of the left upper lobe   Indication for treatment: Curative     Radiation treatment dates:  05/16/17-07/02/17  Site/dose:  1) Left lung/ 60 Gy in 30 fractions   2) Left lung boost/ 6 Gy in 3 fractions  Beams/energy:  1) 3D/ 10X, 15X, 6X    2) Complex Isodose Plan/ 10X, 15X, 6X  Narrative: The patient tolerated radiation treatment relatively well. During treatment, she complained of shortness of breath, difficulty swallowing, and decreased appetite.   Plan: The patient has completed radiation treatment. The patient will return to radiation oncology clinic for routine followup in one month. I advised them to call or return sooner if they have any questions or concerns related to their recovery or treatment.  ------------------------------------------------  Jodelle Gross, MD, PhD  This document serves as a record of services personally performed by Kyung Rudd, MD. It was created on his behalf by Bethann Humble, a trained medical scribe. The creation of this record is based on the scribe's personal observations and the provider's statements to them. This document has been checked and approved by the attending provider.

## 2017-07-17 ENCOUNTER — Telehealth: Payer: Self-pay | Admitting: Medical Oncology

## 2017-07-17 ENCOUNTER — Inpatient Hospital Stay (HOSPITAL_BASED_OUTPATIENT_CLINIC_OR_DEPARTMENT_OTHER): Payer: Medicare Other | Admitting: Medical

## 2017-07-17 ENCOUNTER — Ambulatory Visit (HOSPITAL_COMMUNITY)
Admission: RE | Admit: 2017-07-17 | Discharge: 2017-07-17 | Disposition: A | Discharge status: Home / Self Care | Payer: Medicare Other | Source: Ambulatory Visit | Attending: Medical | Admitting: Medical

## 2017-07-17 ENCOUNTER — Inpatient Hospital Stay: Payer: Medicare Other

## 2017-07-17 ENCOUNTER — Other Ambulatory Visit: Payer: Self-pay | Admitting: Medical Oncology

## 2017-07-17 ENCOUNTER — Ambulatory Visit: Payer: Self-pay

## 2017-07-17 VITALS — BP 139/69 | HR 82 | Temp 98.9°F | Resp 18 | Ht 61.0 in

## 2017-07-17 DIAGNOSIS — G9519 Other vascular myelopathies: Secondary | ICD-10-CM | POA: Insufficient documentation

## 2017-07-17 DIAGNOSIS — R531 Weakness: Secondary | ICD-10-CM

## 2017-07-17 DIAGNOSIS — W19XXXA Unspecified fall, initial encounter: Secondary | ICD-10-CM

## 2017-07-17 DIAGNOSIS — Z923 Personal history of irradiation: Secondary | ICD-10-CM

## 2017-07-17 DIAGNOSIS — M549 Dorsalgia, unspecified: Secondary | ICD-10-CM

## 2017-07-17 DIAGNOSIS — C3412 Malignant neoplasm of upper lobe, left bronchus or lung: Secondary | ICD-10-CM

## 2017-07-17 DIAGNOSIS — R1310 Dysphagia, unspecified: Secondary | ICD-10-CM

## 2017-07-17 DIAGNOSIS — R5381 Other malaise: Secondary | ICD-10-CM

## 2017-07-17 DIAGNOSIS — Z79899 Other long term (current) drug therapy: Secondary | ICD-10-CM | POA: Diagnosis not present

## 2017-07-17 DIAGNOSIS — M4854XA Collapsed vertebra, not elsewhere classified, thoracic region, initial encounter for fracture: Secondary | ICD-10-CM

## 2017-07-17 DIAGNOSIS — R5383 Other fatigue: Secondary | ICD-10-CM

## 2017-07-17 DIAGNOSIS — R2681 Unsteadiness on feet: Secondary | ICD-10-CM

## 2017-07-17 DIAGNOSIS — M545 Low back pain: Secondary | ICD-10-CM

## 2017-07-17 LAB — CMP (CANCER CENTER ONLY)
ALBUMIN: 2.6 g/dL — AB (ref 3.5–5.0)
ALK PHOS: 128 U/L (ref 40–150)
ALT: 14 U/L (ref 0–55)
AST: 16 U/L (ref 5–34)
Anion gap: 10 (ref 3–11)
BILIRUBIN TOTAL: 0.3 mg/dL (ref 0.2–1.2)
BUN: 17 mg/dL (ref 7–26)
CALCIUM: 9 mg/dL (ref 8.4–10.4)
CO2: 25 mmol/L (ref 22–29)
Chloride: 97 mmol/L — ABNORMAL LOW (ref 98–109)
Creatinine: 0.83 mg/dL (ref 0.60–1.10)
GFR, Est AFR Am: >60 mL/min (ref >60)
GFR, Estimated: >60 mL/min (ref >60)
GLUCOSE: 189 mg/dL — AB (ref 70–140)
POTASSIUM: 4.1 mmol/L (ref 3.5–5.1)
Sodium: 132 mmol/L — ABNORMAL LOW (ref 136–145)
TOTAL PROTEIN: 6.3 g/dL — AB (ref 6.4–8.3)

## 2017-07-17 LAB — CBC WITH DIFFERENTIAL (CANCER CENTER ONLY)
BASOS ABS: 0 10*3/uL (ref 0.0–0.1)
Basophils Relative: 1 %
EOS PCT: 4 %
Eosinophils Absolute: 0.1 10*3/uL (ref 0.0–0.5)
HCT: 30.8 % — ABNORMAL LOW (ref 34.8–46.6)
Hemoglobin: 10 g/dL — ABNORMAL LOW (ref 11.6–15.9)
LYMPHS PCT: 14 %
Lymphs Abs: 0.4 10*3/uL — ABNORMAL LOW (ref 0.9–3.3)
MCH: 29 pg (ref 25.1–34.0)
MCHC: 32.6 g/dL (ref 31.5–36.0)
MCV: 89.1 fL (ref 79.5–101.0)
MONO ABS: 0.3 10*3/uL (ref 0.1–0.9)
MONOS PCT: 10 %
Neutro Abs: 2.1 10*3/uL (ref 1.5–6.5)
Neutrophils Relative %: 71 %
PLATELETS: 213 10*3/uL (ref 145–400)
RBC: 3.46 MIL/uL — ABNORMAL LOW (ref 3.70–5.45)
RDW: 20.4 % — AB (ref 11.2–14.5)
WBC Count: 3 10*3/uL — ABNORMAL LOW (ref 3.9–10.3)

## 2017-07-17 MED ORDER — HYDROCODONE-ACETAMINOPHEN 5-325 MG PO TABS
1.0000 | ORAL_TABLET | Freq: Once | ORAL | Status: AC
  Administered 2017-07-17: 1 via ORAL

## 2017-07-17 MED ORDER — TRAMADOL HCL 50 MG PO TABS: 50.0000 mg | ORAL_TABLET | Freq: Two times a day (BID) | ORAL | 0 refills | Status: DC | PRN

## 2017-07-17 MED ORDER — HYDROCODONE-ACETAMINOPHEN 5-325 MG PO TABS
ORAL_TABLET | ORAL | Status: AC
  Filled 2017-07-17: qty 1

## 2017-07-17 NOTE — Progress Notes (Signed)
@   1630 pt continued to c/o back pain. 8/10 with movement. Pt to go to radiology for back xrays and wanted pain medication prior to xrays.  Given hydrocodone/apap. She was only able to swallow 1/2 of the pill d/t painful swallowing.  Transported to Radiology for xrays. Radiology tech to take pt to Cancer front lobby when xray is completed and daughter will take her home.

## 2017-07-17 NOTE — Telephone Encounter (Signed)
Confusion, falls ,weakness.  [name] fell this past weekend on her right arm -not xrayed but evaluated by PCP. April said her arm "lloks really bad". She is very concerned her Na+ may be low again. She would like a call after the visit as she cannot be there. Her Aunt will accompany pt.

## 2017-07-18 ENCOUNTER — Telehealth: Payer: Self-pay | Admitting: *Deleted

## 2017-07-18 NOTE — Telephone Encounter (Signed)
TCT Edwinna Areola regarding PT referral for patient. Referral placed in Epic at then end of the day 07/17/17 per Sandi Mealy, PA

## 2017-07-19 ENCOUNTER — Other Ambulatory Visit: Payer: Self-pay | Admitting: Medical

## 2017-07-19 DIAGNOSIS — M8008XA Age-related osteoporosis with current pathological fracture, vertebra(e), initial encounter for fracture: Secondary | ICD-10-CM

## 2017-07-19 NOTE — Progress Notes (Signed)
Symptoms Management Clinic Progress Note   [name] [id number] 1928/12/22 84 y.o.  [name] is managed by Dr. Eilleen Kempf  Actively treated with chemotherapy: no  Last Treated: 06/18/2017 (cycle 5, day 1 of carboplatin and paclitaxel) 07/02/2017 (radiation therapy)  Assessment: Plan:    Acute midline back pain, unspecified back location - Plan: DG Thoracolumabar Spine, HYDROcodone-acetaminophen (NORCO/VICODIN) 5-325 MG per tablet 1 tablet  Physical deconditioning - Plan: Ambulatory referral to [place]: Ambulatory referral to [place], initial encounter - Plan: Ambulatory referral to [place]  Gait instability - Plan: Ambulatory referral to [place]  Malignant neoplasm of bronchus of left upper lobe ([place])   Acute midline back pain status post fall: Patient was referred for an x-ray of the thoracolumbar spine with results showing a new T11 compression fracture with vertebral body height loss of approximately 35%.  No bony retropulsion or involvement of the posterior elements was identified.  A referral will be made to orthopedics.  I have contacted the patient's granddaughter who is her medical power of attorney to ask where she would like her grandmother to be seen.  I have asked her to call with that information.  Physical deconditioning, weakness, and gait instability: I have made a referral for home physical therapy for the patient.  I have asked that the home physical therapy group contact Dr. Leonard Downing who is the patient's primary care provider for any additional information needed.  Malignant neoplasm of the bronchus of the left upper lobe: The patient is status post concurrent chemoradiation.  She is pending a restaging chest CT.  She is to follow-up with Dr. Julien Nordmann on 07/26/2017.  Please see After Visit Summary for patient specific instructions.  Future Appointments  Date Time Provider [place]  07/23/2017  8:00 AM [facility] ONLY [facility] None  07/26/2017  8:30 AM Curt Bears, MD [facility] None  07/31/2017  9:30 AM Hayden Pedro, PA-C [facility] None    Orders Placed This Encounter  Procedures  . DG Thoracolumabar Spine  . Ambulatory referral to Home Health       Subjective:   Patient ID:  [name] is a 84 y.o. (DOB 1928/06/06) female.  Chief Complaint:  Chief Complaint  Patient presents with  . Dizziness    s/p fall last night and 1 week ago    HPI [name] is an 84 year old female with a stage IIIa (T2b, and 2, M0) non-small cell lung cancer, squamous cell carcinoma who initially presented with a left upper lobe mass, left hilar and mediastinal lymphadenopathy when she was diagnosed in November 2018.  She was treated with concurrent chemoradiation with weekly carboplatin and paclitaxel with her first dose of chemotherapy given on 05/21/2017 and cycle 5 dosed on 06/18/2017.  She was last treated with radiation therapy on 07/02/2017.  She presents to the office today with report of progressive weakness, anorexia and decreased activity.  She is using a walker for ambulation.  Last evening she was up to the bathroom when she fell backwards onto her back.  She is having midthoracic back pain today.  She has not taken any medications other than Tylenol for this.  She also fell against the wall on 07/12/2017 causing a skin tear on her right posterior forearm.  She is scheduled to have restaging scans completed prior to her follow-up with Dr. Julien Nordmann on 07/26/2017.  She is awaiting a home health  evaluation.  Her daughter who accompanies her today states that they have not been contacted by anyone.  She had some pain with swallowing after radiation.  This is improving.  She still reports that she feels as though she could have a "lump in her throat".  Medications: I have reviewed the patient's current medications.  Allergies:  Allergies    Allergen Reactions  . Dextroamphetamine Sulfate Er Other (See Comments)    Euphoric feeling  . Other     Other reaction(s): Other (See Comments) Euphoric feeling    Past Medical History:  Diagnosis Date  . Abdominal aortic atherosclerosis ([place])   . CAD (coronary artery disease)   . Carotid artery occlusion   . Common bile duct dilation   . Gallstones   . Headache(784.0)   . Hiatal hernia   . HLD (hyperlipidemia)   . Myocardial infarction ([place])   . PONV (postoperative nausea and vomiting)   . Stroke [facility]) 2003    Past Surgical History:  Procedure Laterality Date  . ABDOMINAL HYSTERECTOMY    . ANKLE FRACTURE SURGERY    . APPENDECTOMY    . BLADDER SURGERY    . CHOLECYSTECTOMY N/A 05/05/2013   Procedure: LAPAROSCOPIC CHOLECYSTECTOMY WITH INTRAOPERATIVE CHOLANGIOGRAM;  Surgeon: Ralene Ok, MD;  Location: [place];  Service: General;  Laterality: N/A;  . HIP ARTHROPLASTY Right 01/10/2014   Procedure: RIGHT HIP HEMIARTHROPLASTY;  Surgeon: Johnn Hai, MD;  Location: [facility];  Service: Orthopedics;  Laterality: Right;  DEPUY AML LATERAL WITH MARK II  . ROTATOR CUFF REPAIR Left   . TONSILLECTOMY      Family History  Problem Relation Age of Onset  . Stroke Mother   . Breast cancer Maternal Aunt   . Prostate cancer Maternal Grandfather     Social History   Socioeconomic History  . Marital status: Widowed    Spouse name: Not on file  . Number of children: 3  . Years of education: Not on file  . Highest education level: Not on file  Social Needs  . Financial resource strain: Not on file  . Food insecurity - worry: Not on file  . Food insecurity - inability: Not on file  . Transportation needs - medical: Not on file  . Transportation needs - non-medical: Not on file  Occupational History  . Occupation: Retired  Tobacco Use  . Smoking status: Former Smoker    Types: Cigarettes    Last attempt to quit: 03/24/2017    Years since quitting: 0.3  . Smokeless  tobacco: Never Used  Substance and Sexual Activity  . Alcohol use: No  . Drug use: No  . Sexual activity: Not on file  Other Topics Concern  . Not on file  Social History Narrative   The patient is married with 3 adult sons   Retired   4 cups of caffeine daily   Originally from [place]   03/20/2013          Past Medical History, Surgical history, Social history, and Family history were reviewed and updated as appropriate.   Please see review of systems for further details on the patient's review from today.   Review of Systems:  Review of Systems  Constitutional: Positive for activity change, appetite change and fatigue. Negative for chills, diaphoresis and fever.  HENT: Negative for trouble swallowing.   Respiratory: Negative for cough, choking, chest tightness, shortness of breath and wheezing.   Cardiovascular: Negative for chest pain and palpitations.  Gastrointestinal: Negative for  nausea and vomiting.  Musculoskeletal: Positive for back pain and gait problem.  Skin: Positive for wound.  Neurological: Positive for weakness. Negative for syncope and speech difficulty.    Objective:   Physical Exam:  BP 139/69 (BP Location: Left Arm, Patient Position: Sitting)   Pulse 82   Temp 98.9 F (37.2 C) (Oral)   Resp 18   Ht 5\' 1"  (1.549 m)   SpO2 97%   BMI 21.92 kg/m  ECOG: 2  Physical Exam  Constitutional:  The patient is an elderly chronically ill and weak appearing female.  Cardiovascular: Normal rate, regular rhythm and normal heart sounds. Exam reveals no gallop and no friction rub.  No murmur heard. Pulmonary/Chest: Effort normal and breath sounds normal. No respiratory distress. She has no wheezes. She has no rales.  Abdominal: Soft. Bowel sounds are normal. She exhibits no distension. There is no tenderness. There is no rebound and no guarding.  Musculoskeletal: She exhibits tenderness (Tenderness over the mid thoracic spine.). She exhibits no edema.  Skin: Skin  is warm and dry.  There is a skin tear over the posterior right proximal forearm.      Lab Review:     Component Value Date/Time   NA 132 (L) 07/17/2017 1330   NA 135 (L) 05/21/2017 0829   K 4.1 07/17/2017 1330   K 3.6 05/21/2017 0829   CL 97 (L) 07/17/2017 1330   CO2 25 07/17/2017 1330   CO2 27 05/21/2017 0829   GLUCOSE 189 (H) 07/17/2017 1330   GLUCOSE 162 (H) 05/21/2017 0829   BUN 17 07/17/2017 1330   BUN 22.6 05/21/2017 0829   CREATININE 0.83 07/17/2017 1330   CREATININE 1.1 05/21/2017 0829   CALCIUM 9.0 07/17/2017 1330   CALCIUM 11.3 (H) 05/21/2017 0829   PROT 6.3 (L) 07/17/2017 1330   PROT 6.7 05/21/2017 0829   ALBUMIN 2.6 (L) 07/17/2017 1330   ALBUMIN 2.4 (L) 05/21/2017 0829   AST 16 07/17/2017 1330   AST 14 05/21/2017 0829   ALT 14 07/17/2017 1330   ALT 15 05/21/2017 0829   ALKPHOS 128 07/17/2017 1330   ALKPHOS 143 05/21/2017 0829   BILITOT 0.3 07/17/2017 1330   BILITOT 0.38 05/21/2017 0829   GFRNONAA >60 07/17/2017 1330   GFRAA >60 07/17/2017 1330       Component Value Date/Time   WBC 3.0 (L) 07/17/2017 1330   WBC 1.9 (L) 06/25/2017 0805   RBC 3.46 (L) 07/17/2017 1330   HGB 9.0 (L) 06/25/2017 0805   HGB 10.4 (L) 05/21/2017 0829   HCT 30.8 (L) 07/17/2017 1330   HCT 32.9 (L) 05/21/2017 0829   PLT 213 07/17/2017 1330   PLT 363 05/21/2017 0829   MCV 89.1 07/17/2017 1330   MCV 87.5 05/21/2017 0829   MCH 29.0 07/17/2017 1330   MCHC 32.6 07/17/2017 1330   RDW 20.4 (H) 07/17/2017 1330   RDW 13.8 05/21/2017 0829   LYMPHSABS 0.4 (L) 07/17/2017 1330   LYMPHSABS 1.0 05/21/2017 0829   MONOABS 0.3 07/17/2017 1330   MONOABS 0.5 05/21/2017 0829   EOSABS 0.1 07/17/2017 1330   EOSABS 0.2 05/21/2017 0829   BASOSABS 0.0 07/17/2017 1330   BASOSABS 0.0 05/21/2017 0829   -------------------------------  Imaging from last 24 hours (if applicable):  Radiology interpretation: Dg Thoracolumabar Spine  Result Date: 07/17/2017 CLINICAL DATA:  Mid and low back pain  since a fall last night. Initial encounter. EXAM: THORACOLUMBAR SPINE 1V COMPARISON:  PA and lateral chest 05/14/2017 and 05/10/2017. FINDINGS: The  patient has a new mild compression fracture of T11. Vertebral body height loss is estimated at 35%. No bony retropulsion is seen. No involvement of the posterior elements. Vertebral body height is otherwise normal. Lower lumbar facet arthropathy is noted. Extensive atherosclerosis is seen. IMPRESSION: T11 compression fracture with vertebral body height loss of approximately 35% is new since the most recent comparison. No bony retropulsion or involvement of the posterior elements is identified. No other acute abnormality. Atherosclerosis. Electronically Signed   By: Inge Rise M.D.   On: 07/17/2017 17:38

## 2017-07-20 ENCOUNTER — Telehealth: Payer: Self-pay | Admitting: *Deleted

## 2017-07-20 NOTE — Telephone Encounter (Signed)
Oncology Nurse Navigator Documentation  Oncology Nurse Navigator Flowsheets 07/20/2017  Navigator Location [facility]  Navigator Encounter Type Telephone/I followed up on [name]'s schedule. Patient needs CT chest before she sees Dr. Julien Nordmann next week. I called to check on her. I was unable to reach her but did leave vm message with my name and phone number to call.   Telephone Outgoing Call  Treatment Phase Follow-up  Barriers/Navigation Needs Coordination of Care;Education  Education Other  Interventions Coordination of Care;Education  Coordination of Care Other  Education Method Verbal  Acuity Level 2  Time Spent with Patient 30

## 2017-07-23 ENCOUNTER — Telehealth: Payer: Self-pay | Admitting: Medical Oncology

## 2017-07-23 ENCOUNTER — Ambulatory Visit (HOSPITAL_COMMUNITY)
Admission: RE | Admit: 2017-07-23 | Discharge: 2017-07-23 | Disposition: A | Discharge status: Home / Self Care | Payer: Medicare Other | Source: Ambulatory Visit | Attending: Internal Medicine | Admitting: Internal Medicine

## 2017-07-23 ENCOUNTER — Inpatient Hospital Stay: Payer: Medicare Other | Attending: Internal Medicine

## 2017-07-23 ENCOUNTER — Encounter (HOSPITAL_COMMUNITY): Payer: Self-pay

## 2017-07-23 DIAGNOSIS — Z8673 Personal history of transient ischemic attack (TIA), and cerebral infarction without residual deficits: Secondary | ICD-10-CM | POA: Insufficient documentation

## 2017-07-23 DIAGNOSIS — E860 Dehydration: Secondary | ICD-10-CM | POA: Insufficient documentation

## 2017-07-23 DIAGNOSIS — Y842 Radiological procedure and radiotherapy as the cause of abnormal reaction of the patient, or of later complication, without mention of misadventure at the time of the procedure: Secondary | ICD-10-CM | POA: Diagnosis not present

## 2017-07-23 DIAGNOSIS — Z923 Personal history of irradiation: Secondary | ICD-10-CM | POA: Diagnosis not present

## 2017-07-23 DIAGNOSIS — I6529 Occlusion and stenosis of unspecified carotid artery: Secondary | ICD-10-CM | POA: Insufficient documentation

## 2017-07-23 DIAGNOSIS — I700 Atherosclerosis of aorta: Secondary | ICD-10-CM | POA: Insufficient documentation

## 2017-07-23 DIAGNOSIS — I252 Old myocardial infarction: Secondary | ICD-10-CM | POA: Diagnosis not present

## 2017-07-23 DIAGNOSIS — R1310 Dysphagia, unspecified: Secondary | ICD-10-CM | POA: Diagnosis not present

## 2017-07-23 DIAGNOSIS — C3412 Malignant neoplasm of upper lobe, left bronchus or lung: Secondary | ICD-10-CM | POA: Insufficient documentation

## 2017-07-23 DIAGNOSIS — C3490 Malignant neoplasm of unspecified part of unspecified bronchus or lung: Secondary | ICD-10-CM | POA: Diagnosis present

## 2017-07-23 DIAGNOSIS — K449 Diaphragmatic hernia without obstruction or gangrene: Secondary | ICD-10-CM | POA: Diagnosis not present

## 2017-07-23 DIAGNOSIS — Z7982 Long term (current) use of aspirin: Secondary | ICD-10-CM | POA: Diagnosis not present

## 2017-07-23 DIAGNOSIS — I2510 Atherosclerotic heart disease of native coronary artery without angina pectoris: Secondary | ICD-10-CM | POA: Diagnosis not present

## 2017-07-23 DIAGNOSIS — E785 Hyperlipidemia, unspecified: Secondary | ICD-10-CM | POA: Diagnosis not present

## 2017-07-23 DIAGNOSIS — Z79899 Other long term (current) drug therapy: Secondary | ICD-10-CM | POA: Diagnosis not present

## 2017-07-23 LAB — CMP (CANCER CENTER ONLY)
ALT: 15 U/L (ref 0–55)
ANION GAP: 8 (ref 3–11)
AST: 18 U/L (ref 5–34)
Albumin: 2.8 g/dL — ABNORMAL LOW (ref 3.5–5.0)
Alkaline Phosphatase: 153 U/L — ABNORMAL HIGH (ref 40–150)
BUN: 15 mg/dL (ref 7–26)
CALCIUM: 9.6 mg/dL (ref 8.4–10.4)
CO2: 27 mmol/L (ref 22–29)
Chloride: 93 mmol/L — ABNORMAL LOW (ref 98–109)
Creatinine: 0.84 mg/dL (ref 0.60–1.10)
GLUCOSE: 105 mg/dL (ref 70–140)
POTASSIUM: 4 mmol/L (ref 3.5–5.1)
SODIUM: 128 mmol/L — AB (ref 136–145)
TOTAL PROTEIN: 6.8 g/dL (ref 6.4–8.3)
Total Bilirubin: 0.4 mg/dL (ref 0.2–1.2)

## 2017-07-23 LAB — CBC WITH DIFFERENTIAL (CANCER CENTER ONLY)
BASOS ABS: 0.1 10*3/uL (ref 0.0–0.1)
BASOS PCT: 1 %
Eosinophils Absolute: 0.3 10*3/uL (ref 0.0–0.5)
Eosinophils Relative: 7 %
HEMATOCRIT: 32.6 % — AB (ref 34.8–46.6)
Hemoglobin: 10.7 g/dL — ABNORMAL LOW (ref 11.6–15.9)
LYMPHS PCT: 15 %
Lymphs Abs: 0.6 10*3/uL — ABNORMAL LOW (ref 0.9–3.3)
MCH: 29.5 pg (ref 25.1–34.0)
MCHC: 32.8 g/dL (ref 31.5–36.0)
MCV: 89.8 fL (ref 79.5–101.0)
MONO ABS: 0.5 10*3/uL (ref 0.1–0.9)
Monocytes Relative: 11 %
NEUTROS ABS: 2.6 10*3/uL (ref 1.5–6.5)
Neutrophils Relative %: 66 %
PLATELETS: 250 10*3/uL (ref 145–400)
RBC: 3.63 MIL/uL — AB (ref 3.70–5.45)
RDW: 18.3 % — AB (ref 11.2–14.5)
WBC: 4 10*3/uL (ref 3.9–10.3)

## 2017-07-23 MED ORDER — IOPAMIDOL (ISOVUE-300) INJECTION 61%
INTRAVENOUS | Status: AC
  Filled 2017-07-23: qty 75

## 2017-07-23 MED ORDER — SODIUM CHLORIDE 0.9 % IJ SOLN
INTRAMUSCULAR | Status: AC
  Filled 2017-07-23: qty 50

## 2017-07-23 MED ORDER — IOPAMIDOL (ISOVUE-300) INJECTION 61%
75.0000 mL | Freq: Once | INTRAVENOUS | Status: AC | PRN
  Administered 2017-07-23: 60 mL via INTRAVENOUS

## 2017-07-23 NOTE — Telephone Encounter (Signed)
Requests PT treatment visits-1x week for 1 week , 3xweek x 3 weeks. Okay for treatment . Kim called requesting refill tramadol. When I called back , [name] said [name]  rx was not filled initially  for 30 days so she has the rest of the rx  to pick up at pharmacy.

## 2017-07-24 ENCOUNTER — Telehealth: Payer: Self-pay | Admitting: *Deleted
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
## 2017-07-24 NOTE — Telephone Encounter (Signed)
She did have a good response to the previous treatment.  I can discuss the results in detail with him when she comes for her appointment.

## 2017-07-25 ENCOUNTER — Encounter: Payer: Self-pay | Admitting: General Practice

## 2017-07-25 NOTE — Progress Notes (Signed)
[place] CSW Progress Notes  Referral received from RN, forwarded call from [place] expressing concern re patient's situation in the home.  CSW attempted to return call to [facility], no answer/VM not identified.  CSW will try to see family at [place] MD appt to discuss concerns.  Edwyna Shell, LCSW Clinical Social Worker Phone:  [phone]

## 2017-07-26 ENCOUNTER — Encounter: Payer: Self-pay | Admitting: General Practice

## 2017-07-26 ENCOUNTER — Inpatient Hospital Stay (HOSPITAL_BASED_OUTPATIENT_CLINIC_OR_DEPARTMENT_OTHER): Payer: Medicare Other | Admitting: Internal Medicine

## 2017-07-26 ENCOUNTER — Telehealth: Payer: Self-pay | Admitting: Internal Medicine

## 2017-07-26 ENCOUNTER — Inpatient Hospital Stay: Payer: Medicare Other

## 2017-07-26 ENCOUNTER — Other Ambulatory Visit: Payer: Self-pay | Admitting: Medical Oncology

## 2017-07-26 ENCOUNTER — Encounter: Payer: Self-pay | Admitting: Internal Medicine

## 2017-07-26 VITALS — BP 84/47 | HR 95 | Temp 98.3°F | Resp 18 | Ht 61.0 in

## 2017-07-26 DIAGNOSIS — C3490 Malignant neoplasm of unspecified part of unspecified bronchus or lung: Secondary | ICD-10-CM

## 2017-07-26 DIAGNOSIS — E860 Dehydration: Secondary | ICD-10-CM

## 2017-07-26 DIAGNOSIS — R1310 Dysphagia, unspecified: Secondary | ICD-10-CM | POA: Diagnosis not present

## 2017-07-26 DIAGNOSIS — Y842 Radiological procedure and radiotherapy as the cause of abnormal reaction of the patient, or of later complication, without mention of misadventure at the time of the procedure: Secondary | ICD-10-CM

## 2017-07-26 DIAGNOSIS — Z923 Personal history of irradiation: Secondary | ICD-10-CM

## 2017-07-26 DIAGNOSIS — I959 Hypotension, unspecified: Secondary | ICD-10-CM

## 2017-07-26 DIAGNOSIS — C3412 Malignant neoplasm of upper lobe, left bronchus or lung: Secondary | ICD-10-CM | POA: Diagnosis not present

## 2017-07-26 DIAGNOSIS — Z79899 Other long term (current) drug therapy: Secondary | ICD-10-CM

## 2017-07-26 MED ORDER — SODIUM CHLORIDE 0.9 % IV SOLN
Freq: Once | INTRAVENOUS | Status: AC
  Administered 2017-07-26: 10:00:00 via INTRAVENOUS
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
## 2017-07-26 NOTE — Telephone Encounter (Signed)
Scheduled appt per 3/7 los - Gave patient AVS and calender per los - central radiology to contact patient .
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
## 2017-07-26 NOTE — Patient Instructions (Signed)
Dehydration, Adult Dehydration is a condition in which there is not enough fluid or water in the body. This happens when you lose more fluids than you take in. Important organs, such as the kidneys, brain, and heart, cannot function without a proper amount of fluids. Any loss of fluids from the body can lead to dehydration. Dehydration can range from mild to severe. This condition should be treated right away to prevent it from becoming severe. What are the causes? This condition may be caused by:  Vomiting.  Diarrhea.  Excessive sweating, such as from heat exposure or exercise.  Not drinking enough fluid, especially: ? When ill. ? While doing activity that requires a lot of energy.  Excessive urination.  Fever.  Infection.  Certain medicines, such as medicines that cause the body to lose excess fluid (diuretics).  Inability to access safe drinking water.  Reduced physical ability to get adequate water and food.  What increases the risk? This condition is more likely to develop in people:  Who have a poorly controlled long-term (chronic) illness, such as diabetes, heart disease, or kidney disease.  Who are age 65 or older.  Who are disabled.  Who live in a place with high altitude.  Who play endurance sports.  What are the signs or symptoms? Symptoms of mild dehydration may include:  Thirst.  Dry lips.  Slightly dry mouth.  Dry, warm skin.  Dizziness. Symptoms of moderate dehydration may include:  Very dry mouth.  Muscle cramps.  Dark urine. Urine may be the color of tea.  Decreased urine production.  Decreased tear production.  Heartbeat that is irregular or faster than normal (palpitations).  Headache.  Light-headedness, especially when you stand up from a sitting position.  Fainting (syncope). Symptoms of severe dehydration may include:  Changes in skin, such as: ? Cold and clammy skin. ? Blotchy (mottled) or pale skin. ? Skin that does  not quickly return to normal after being lightly pinched and released (poor skin turgor).  Changes in body fluids, such as: ? Extreme thirst. ? No tear production. ? Inability to sweat when body temperature is high, such as in hot weather. ? Very little urine production.  Changes in vital signs, such as: ? Weak pulse. ? Pulse that is more than 100 beats a minute when sitting still. ? Rapid breathing. ? Low blood pressure.  Other changes, such as: ? Sunken eyes. ? Cold hands and feet. ? Confusion. ? Lack of energy (lethargy). ? Difficulty waking up from sleep. ? Short-term weight loss. ? Unconsciousness. How is this diagnosed? This condition is diagnosed based on your symptoms and a physical exam. Blood and urine tests may be done to help confirm the diagnosis. How is this treated? Treatment for this condition depends on the severity. Mild or moderate dehydration can often be treated at home. Treatment should be started right away. Do not wait until dehydration becomes severe. Severe dehydration is an emergency and it needs to be treated in a hospital. Treatment for mild dehydration may include:  Drinking more fluids.  Replacing salts and minerals in your blood (electrolytes) that you may have lost. Treatment for moderate dehydration may include:  Drinking an oral rehydration solution (ORS). This is a drink that helps you replace fluids and electrolytes (rehydrate). It can be found at pharmacies and retail stores. Treatment for severe dehydration may include:  Receiving fluids through an IV tube.  Receiving an electrolyte solution through a feeding tube that is passed through your nose   and into your stomach (nasogastric tube, or NG tube).  Correcting any abnormalities in electrolytes.  Treating the underlying cause of dehydration. Follow these instructions at home:  If directed by your health care provider, drink an ORS: ? Make an ORS by following instructions on the  package. ? Start by drinking small amounts, about  cup (120 mL) every 5-10 minutes. ? Slowly increase how much you drink until you have taken the amount recommended by your health care provider.  Drink enough clear fluid to keep your urine clear or pale yellow. If you were told to drink an ORS, finish the ORS first, then start slowly drinking other clear fluids. Drink fluids such as: ? Water. Do not drink only water. Doing that can lead to having too little salt (sodium) in the body (hyponatremia). ? Ice chips. ? Fruit juice that you have added water to (diluted fruit juice). ? Low-calorie sports drinks.  Avoid: ? Alcohol. ? Drinks that contain a lot of sugar. These include high-calorie sports drinks, fruit juice that is not diluted, and soda. ? Caffeine. ? Foods that are greasy or contain a lot of fat or sugar.  Take over-the-counter and prescription medicines only as told by your health care provider.  Do not take sodium tablets. This can lead to having too much sodium in the body (hypernatremia).  Eat foods that contain a healthy balance of electrolytes, such as bananas, oranges, potatoes, tomatoes, and spinach.  Keep all follow-up visits as told by your health care provider. This is important. Contact a health care provider if:  You have abdominal pain that: ? Gets worse. ? Stays in one area (localizes).  You have a rash.  You have a stiff neck.  You are more irritable than usual.  You are sleepier or more difficult to wake up than usual.  You feel weak or dizzy.  You feel very thirsty.  You have urinated only a small amount of very dark urine over 6-8 hours. Get help right away if:  You have symptoms of severe dehydration.  You cannot drink fluids without vomiting.  Your symptoms get worse with treatment.  You have a fever.  You have a severe headache.  You have vomiting or diarrhea that: ? Gets worse. ? Does not go away.  You have blood or green matter  (bile) in your vomit.  You have blood in your stool. This may cause stool to look black and tarry.  You have not urinated in 6-8 hours.  You faint.  Your heart rate while sitting still is over 100 beats a minute.  You have trouble breathing. This information is not intended to replace advice given to you by your health care provider. Make sure you discuss any questions you have with your health care provider. Document Released: 05/08/2005 Document Revised: 12/03/2015 Document Reviewed: 07/02/2015 Elsevier Interactive Patient Education  2018 Elsevier Inc.  

## 2017-07-26 NOTE — Progress Notes (Signed)
[facility] CSW Progress Notes  Per DSS, patient does not have a Medicaid application on file in [place] at this time.  HCPOA advised to contact worker she worked w to start MCD application on behalf of patient and see what needs to be done next.  Edwyna Shell, LCSW Clinical Social Worker Phone:  [phone]

## 2017-07-26 NOTE — Progress Notes (Signed)
[facility] CSW Progress Notes  Referral made to [facility].  Per [facility] PT, patient can walk 10 - 20 feet, min assist for transfer and ambulation. Per family, two person/mod assist for bathing, toileting.  [name] aware that [facility] has not been able to reach daughter [name] to schedule completion of assessment process - will address w sister.  Edwyna Shell, LCSW Clinical Social Worker Phone:  [phone]

## 2017-07-26 NOTE — Progress Notes (Signed)
Pt daughter requested order for BP cuff. Per Dr. Worthy Flank desk RN, Diane, okay to go to any medical store and purchase for home use. No order needed. AVS printed with dehydration instructions and f/u appointments. Pt stable upon discharge. Pt daughter encouraged to call back tomorrow to speak with Dr. Worthy Flank RN regarding [facility] referral if she has not heard anything yet. Referral in process per Shauna Hugh, RN.

## 2017-07-27 ENCOUNTER — Telehealth: Payer: Self-pay | Admitting: *Deleted

## 2017-07-27 ENCOUNTER — Other Ambulatory Visit: Payer: Self-pay | Admitting: Medical Oncology

## 2017-07-27 DIAGNOSIS — R630 Anorexia: Secondary | ICD-10-CM

## 2017-07-27 MED ORDER — METHYLPREDNISOLONE 4 MG PO TBPK: ORAL_TABLET | ORAL | 0 refills | Status: DC

## 2017-07-27 NOTE — Telephone Encounter (Signed)
rx was picked up

## 2017-07-27 NOTE — Telephone Encounter (Signed)
"  My step-mom was there yesterday.  A medicine was to be sent to [facility].  Pharmacy has not received order."

## 2017-07-31 ENCOUNTER — Other Ambulatory Visit: Payer: Self-pay

## 2017-07-31 ENCOUNTER — Encounter: Payer: Self-pay | Admitting: Radiation Oncology

## 2017-07-31 ENCOUNTER — Ambulatory Visit
Admission: RE | Admit: 2017-07-31 | Discharge: 2017-07-31 | Disposition: A | Discharge status: Home / Self Care | Payer: Medicare Other | Source: Ambulatory Visit | Attending: Radiation Oncology | Admitting: Radiation Oncology

## 2017-07-31 VITALS — BP 123/68 | HR 96 | Temp 97.6°F | Resp 18 | Ht 61.0 in | Wt 110.0 lb

## 2017-07-31 DIAGNOSIS — C3412 Malignant neoplasm of upper lobe, left bronchus or lung: Secondary | ICD-10-CM | POA: Insufficient documentation

## 2017-07-31 DIAGNOSIS — Z923 Personal history of irradiation: Secondary | ICD-10-CM | POA: Insufficient documentation

## 2017-07-31 DIAGNOSIS — M4854XA Collapsed vertebra, not elsewhere classified, thoracic region, initial encounter for fracture: Secondary | ICD-10-CM | POA: Insufficient documentation

## 2017-07-31 DIAGNOSIS — Z79899 Other long term (current) drug therapy: Secondary | ICD-10-CM | POA: Diagnosis not present

## 2017-07-31 DIAGNOSIS — Z7982 Long term (current) use of aspirin: Secondary | ICD-10-CM | POA: Diagnosis not present

## 2017-07-31 MED ORDER — TRAMADOL HCL 50 MG PO TABS: 50.0000 mg | ORAL_TABLET | Freq: Two times a day (BID) | ORAL | 0 refills | Status: DC | PRN

## 2017-07-31 NOTE — Progress Notes (Signed)
Radiation Oncology         [phone] ________________________________  Name: [name] MRN: [id number]  Date of Service: 07/31/2017 DOB: 1928/06/16  Post Treatment Note  CC: Leonard Downing, MD  Curt Bears, MD  Diagnosis:   Stage IIIB, [phone] NSCLC, squamous cell carcinoma of the left upper lobe  Interval Since Last Radiation: 4 weeks    05/16/17-07/02/17 1. Left lung/ 60 Gy in 30 fractions 2. Left lung boost/ 6 Gy in 3 fractions   Narrative:  The patient returns today for routine follow-up.  She tolerated treatment rather well but did have some esophagitis which responded to carafate. The patient states she's doing ok. She fell about 3 weeks ago and skinned her right forearm. She has a hydrogel pad over the site that is still intact and she reports she's had some trouble with back pain and was recently found to have an old compression fracture. She still does have some esophagitis and requests a refill of her tramadol for her back pain. But she did have a CT on 07/23/17 that revealed improvement in her disease and she will be followed in surveillance.  ALLERGIES:  is allergic to dextroamphetamine sulfate er and other.  Meds: Current Outpatient Medications  Medication Sig Dispense Refill  . ALPRAZolam (XANAX) 0.25 MG tablet Take 0.5 tablets (0.125 mg total) by mouth 2 (two) times daily as needed for anxiety. 10 tablet 0  . aspirin 81 MG EC tablet Take 81 mg by mouth daily.      . feeding supplement, ENSURE ENLIVE, (ENSURE ENLIVE) LIQD Take 237 mLs 2 (two) times daily between meals by mouth. 30 Bottle 0  . methylPREDNISolone (MEDROL) 4 MG TBPK tablet Take per package directions 21 tablet 0  . PARoxetine (PAXIL) 10 MG tablet Take 10 mg by mouth daily.    . polyethylene glycol (MIRALAX / GLYCOLAX) packet Take 17 g daily as needed by mouth. 14 each 0  . prochlorperazine (COMPAZINE) 10 MG tablet Take 1 tablet (10 mg total) by mouth every 6 (six) hours as needed for  nausea or vomiting. 30 tablet 0  . sucralfate (CARAFATE) 1 GM/10ML suspension Take 10 mLs (1 g total) by mouth 4 (four) times daily -  with meals and at bedtime. 420 mL 0  . traMADol (ULTRAM) 50 MG tablet Take 1 tablet (50 mg total) by mouth every 12 (twelve) hours as needed for moderate pain. 30 tablet 0  . nitroGLYCERIN (NITROSTAT) 0.4 MG SL tablet Place 0.4 mg under the tongue every 5 (five) minutes as needed for chest pain.     No current facility-administered medications for this encounter.     Physical Findings:  height is 5\' 1"  (1.549 m) and weight is 110 lb (49.9 kg). Her oral temperature is 97.6 F (36.4 C). Her blood pressure is 123/68 and her pulse is 96. Her respiration is 18 and oxygen saturation is 96%.  Pain Assessment Pain Score: 5  Pain Loc: Back/10 In general this is a well appearing elderly caucasian female in no acute distress. She's alert and oriented x4 and appropriate throughout the examination. Cardiopulmonary assessment is negative for acute distress and she exhibits normal effort.   Lab Findings: Lab Results  Component Value Date   WBC 4.0 07/23/2017   HGB 9.0 (L) 06/25/2017   HCT 32.6 (L) 07/23/2017   MCV 89.8 07/23/2017   PLT 250 07/23/2017     Radiographic Findings: Dg Thoracolumabar Spine  Result Date: 07/17/2017 CLINICAL DATA:  Mid  and low back pain since a fall last night. Initial encounter. EXAM: THORACOLUMBAR SPINE 1V COMPARISON:  PA and lateral chest 05/14/2017 and 05/10/2017. FINDINGS: The patient has a new mild compression fracture of T11. Vertebral body height loss is estimated at 35%. No bony retropulsion is seen. No involvement of the posterior elements. Vertebral body height is otherwise normal. Lower lumbar facet arthropathy is noted. Extensive atherosclerosis is seen. IMPRESSION: T11 compression fracture with vertebral body height loss of approximately 35% is new since the most recent comparison. No bony retropulsion or involvement of the  posterior elements is identified. No other acute abnormality. Atherosclerosis. Electronically Signed   By: Inge Rise M.D.   On: 07/17/2017 17:38   Ct Chest W Contrast  Result Date: 07/23/2017 CLINICAL DATA:  Non-small-cell lung cancer diagnosed 12/18 with last chemotherapy and radiation therapy in February. Increased shortness of breath. EXAM: CT CHEST WITH CONTRAST TECHNIQUE: Multidetector CT imaging of the chest was performed during intravenous contrast administration. CONTRAST:  86mL ISOVUE-300 IOPAMIDOL (ISOVUE-300) INJECTION 61% COMPARISON:  PET 04/27/2017. Most recent diagnostic CT of 03/30/2017. FINDINGS: Cardiovascular: Advanced aortic and branch vessel atherosclerosis. Extensive ulcerative plaque in the transverse and descending aorta. Normal heart size, without pericardial effusion. Multivessel coronary artery atherosclerosis. No central pulmonary embolism, on this non-dedicated study. Mediastinum/Nodes: No supraclavicular adenopathy. Resolved AP window adenopathy. No mediastinal or hilar adenopathy. Possible mild esophageal wall thickening, including image 44/2. Lungs/Pleura: No pleural fluid. Spiculated, centrally necrotic left apical lung mass measures on the order of 4.3 x 3.7 cm on image 21/series 5. Compare 6.4 x 5.3 cm on the prior CT. Tumor extension at the T2/3 level, including on image 21/2. No gross osseous destruction. Mild centrilobular emphysema. Right upper lobe pulmonary nodules on the order of 3 mm are not significantly changed. Upper Abdomen: Low-density adjacent the falciform ligament is favored to represent focal steatosis. Normal imaged portions of the spleen, stomach, pancreas, kidneys. Left greater than right adrenal thickening with mild left adrenal nodularity, similar. Musculoskeletal: Osteopenia. T11 compression deformity is slightly increased, moderate. Ventral canal encroachment is mild. IMPRESSION: 1. Response to therapy of left apical lung mass and AP window  adenopathy. 2. No new or progressive disease. 3. Coronary artery atherosclerosis. Aortic Atherosclerosis (ICD10-I70.0). 4. Possible esophagitis, as evidenced by mild esophageal wall thickening. 5. Progressive T11 compression deformity. Electronically Signed   By: Abigail Miyamoto M.D.   On: 07/23/2017 12:36    Impression/Plan: 1. Stage IIIB, LA4T3M4 NSCLC, squamous cell carcinoma of the left upper lobe. the patient is recovering from the effects of radiotherapy. She is planning to complete another week of carafate. She will return to Dr. Worthy Flank clinic this summer and contact us if she has questions or concerns moving forward.  2. Compression fracture. The patient's tramadol is going to be refilled. We will defer follow up of this to her PCP as this does not appear to be pathologic in nature.        Carola Rhine, PAC

## 2017-08-13 ENCOUNTER — Emergency Department (HOSPITAL_COMMUNITY): Payer: Medicare Other

## 2017-08-13 ENCOUNTER — Emergency Department (HOSPITAL_COMMUNITY)
Admission: EM | Admit: 2017-08-13 | Discharge: 2017-08-13 | Disposition: A | Discharge status: Home / Self Care | Payer: Medicare Other | Attending: Emergency Medicine | Admitting: Emergency Medicine

## 2017-08-13 ENCOUNTER — Other Ambulatory Visit: Payer: Self-pay

## 2017-08-13 ENCOUNTER — Encounter (HOSPITAL_COMMUNITY): Payer: Self-pay

## 2017-08-13 DIAGNOSIS — W1830XA Fall on same level, unspecified, initial encounter: Secondary | ICD-10-CM | POA: Insufficient documentation

## 2017-08-13 DIAGNOSIS — Y999 Unspecified external cause status: Secondary | ICD-10-CM | POA: Diagnosis not present

## 2017-08-13 DIAGNOSIS — Y939 Activity, unspecified: Secondary | ICD-10-CM | POA: Diagnosis not present

## 2017-08-13 DIAGNOSIS — Y929 Unspecified place or not applicable: Secondary | ICD-10-CM | POA: Diagnosis not present

## 2017-08-13 DIAGNOSIS — Z87891 Personal history of nicotine dependence: Secondary | ICD-10-CM | POA: Insufficient documentation

## 2017-08-13 DIAGNOSIS — S22000D Wedge compression fracture of unspecified thoracic vertebra, subsequent encounter for fracture with routine healing: Secondary | ICD-10-CM

## 2017-08-13 DIAGNOSIS — I2510 Atherosclerotic heart disease of native coronary artery without angina pectoris: Secondary | ICD-10-CM | POA: Diagnosis not present

## 2017-08-13 DIAGNOSIS — I252 Old myocardial infarction: Secondary | ICD-10-CM | POA: Insufficient documentation

## 2017-08-13 DIAGNOSIS — Z79899 Other long term (current) drug therapy: Secondary | ICD-10-CM | POA: Diagnosis not present

## 2017-08-13 DIAGNOSIS — S22080A Wedge compression fracture of T11-T12 vertebra, initial encounter for closed fracture: Secondary | ICD-10-CM | POA: Diagnosis not present

## 2017-08-13 DIAGNOSIS — Z85118 Personal history of other malignant neoplasm of bronchus and lung: Secondary | ICD-10-CM | POA: Insufficient documentation

## 2017-08-13 DIAGNOSIS — R0789 Other chest pain: Secondary | ICD-10-CM | POA: Diagnosis not present

## 2017-08-13 DIAGNOSIS — R0781 Pleurodynia: Secondary | ICD-10-CM

## 2017-08-13 HISTORY — DX: Malignant (primary) neoplasm, unspecified: C80.1

## 2017-08-13 MED ORDER — MORPHINE SULFATE (PF) 4 MG/ML IV SOLN
4.0000 mg | Freq: Once | INTRAVENOUS | Status: AC
  Administered 2017-08-13: 4 mg via INTRAMUSCULAR
  Filled 2017-08-13: qty 1

## 2017-08-13 MED ORDER — OXYCODONE-ACETAMINOPHEN 5-325 MG PO TABS: 1.0000 | ORAL_TABLET | Freq: Four times a day (QID) | ORAL | 0 refills | Status: DC | PRN

## 2017-08-13 NOTE — ED Notes (Signed)
Patient transported to X-ray 

## 2017-08-13 NOTE — Discharge Instructions (Signed)
Contact a health care provider if: You have a fever. Your chest pain becomes worse. You have new symptoms. Get help right away if: You have nausea or vomiting. You feel sweaty or light-headed. You have a cough with phlegm (sputum) or you cough up blood. You develop shortness of breath.

## 2017-08-13 NOTE — ED Triage Notes (Signed)
Patient had a fall by losing her balance in February. Patient states she had an old compression fracture in her back. Patient continues to c/o pain left lower back and left hip area.  Patient finished chemo treatments at the end of February.

## 2017-08-13 NOTE — ED Provider Notes (Signed)
[facility] DEPT Provider Note   CSN: [id number] Arrival date & time: 08/13/17  1449     History   Chief Complaint Chief Complaint  Patient presents with  . Back Pain  . Fall    HPI [name] is a 84 y.o. female who presents the emergency department chief complaint of left rib cage pain.  Patient had a fall 5 weeks ago and was noted to have a T11 compression fracture.  She says she was doing well up until about a week ago when she started having severe sharp pain radiating around her left rib cage.  She is unsure if it is worse with any certain movements but states that when it hits her it takes her breath away.  She denies any coughing, shortness of breath, skin eruptions.  She states that she has been taking pain pills at home that only minimally helped.  She denies abdominal pain nausea or vomiting.  HPI  Past Medical History:  Diagnosis Date  . Abdominal aortic atherosclerosis ([place])   . CAD (coronary artery disease)   . Cancer ([place])   . Carotid artery occlusion   . Common bile duct dilation   . Gallstones   . Headache(784.0)   . Hiatal hernia   . HLD (hyperlipidemia)   . Myocardial infarction ([place])   . PONV (postoperative nausea and vomiting)   . Stroke [facility]) 2003    Patient Active Problem List   Diagnosis Date Noted  . Hypercalcemia 05/21/2017  . MDD (major depressive disorder), recurrent episode, moderate ([place]) 05/11/2017  . Dehydration 05/10/2017  . Weakness 05/10/2017  . Encounter for antineoplastic chemotherapy 05/04/2017  . Goals of care, counseling/discussion 05/04/2017  . Malignant neoplasm of bronchus of left upper lobe ([place]) 05/03/2017  . Chest congestion 04/03/2017  . Constipation 04/03/2017  . Adenoma of left adrenal gland 04/03/2017  . SIADH (syndrome of inappropriate ADH production) ([place]) 04/02/2017  . Lung mass 04/02/2017  . Elevated AST (SGOT) 04/02/2017  . Leukocytosis 04/02/2017  . Normocytic anemia  04/02/2017  . Cerumen impaction 11/19/2015  . Hip fracture requiring operative repair ([place]) 01/09/2014  . Closed right hip fracture ([place]) 01/09/2014  . Hyponatremia 01/09/2014  . Symptomatic cholelithiasis 03/20/2013  . Dilated bile duct 03/20/2013  . Abdominal aortic atherosclerosis ([place])   . Hypertension 10/19/2011  . Tobacco abuse 02/18/2009  . HLD (hyperlipidemia) 02/17/2009  . ANXIETY DISORDER 02/17/2009  . CAD (coronary artery disease) 02/17/2009  . CAROTID ARTERY OCCLUSION 02/17/2009  . CVA 02/17/2009    Past Surgical History:  Procedure Laterality Date  . ABDOMINAL HYSTERECTOMY    . ANKLE FRACTURE SURGERY    . APPENDECTOMY    . BLADDER SURGERY    . CHOLECYSTECTOMY N/A 05/05/2013   Procedure: LAPAROSCOPIC CHOLECYSTECTOMY WITH INTRAOPERATIVE CHOLANGIOGRAM;  Surgeon: Ralene Ok, MD;  Location: [facility];  Service: General;  Laterality: N/A;  . HIP ARTHROPLASTY Right 01/10/2014   Procedure: RIGHT HIP HEMIARTHROPLASTY;  Surgeon: Johnn Hai, MD;  Location: [facility];  Service: Orthopedics;  Laterality: Right;  DEPUY AML LATERAL WITH MARK II  . ROTATOR CUFF REPAIR Left   . TONSILLECTOMY       OB History   None      Home Medications    Prior to Admission medications   Medication Sig Start Date End Date Taking? Authorizing Provider  ALPRAZolam (XANAX) 0.25 MG tablet Take 0.5 tablets (0.125 mg total) by mouth 2 (two) times daily as needed for anxiety. 05/14/17  Yes  Lavina Hamman, MD  aspirin 81 MG EC tablet Take 81 mg by mouth daily.     Yes [provider]  Multiple Vitamins-Minerals (HM MULTIVITAMIN ADULT GUMMY PO) Take 1 tablet by mouth daily.   Yes [provider]  PARoxetine (PAXIL) 20 MG tablet Take 30 mg by mouth at bedtime.  08/13/17  Yes [provider]  polyethylene glycol (MIRALAX / GLYCOLAX) packet Take 17 g daily as needed by mouth. Patient taking differently: Take 17 g by mouth daily as needed for moderate constipation.   04/04/17  Yes Mariel Aloe, MD  protein supplement shake (PREMIER PROTEIN) LIQD Take 2 oz by mouth daily.   Yes [provider]  traMADol (ULTRAM) 50 MG tablet Take 1 tablet (50 mg total) by mouth every 12 (twelve) hours as needed for moderate pain. 07/31/17  Yes Hayden Pedro, PA-C  feeding supplement, ENSURE ENLIVE, (ENSURE ENLIVE) LIQD Take 237 mLs 2 (two) times daily between meals by mouth. Patient not taking: Reported on 08/13/2017 04/05/17   Mariel Aloe, MD  methylPREDNISolone (MEDROL) 4 MG TBPK tablet Take per package directions Patient not taking: Reported on 08/13/2017 07/27/17   Maryanna Shape, NP  nitroGLYCERIN (NITROSTAT) 0.4 MG SL tablet Place 0.4 mg under the tongue every 5 (five) minutes as needed for chest pain.    [provider]  prochlorperazine (COMPAZINE) 10 MG tablet Take 1 tablet (10 mg total) by mouth every 6 (six) hours as needed for nausea or vomiting. 05/03/17   Curcio, Roselie Awkward, NP  sucralfate (CARAFATE) 1 GM/10ML suspension Take 10 mLs (1 g total) by mouth 4 (four) times daily -  with meals and at bedtime. Patient taking differently: Take 1 g by mouth 3 (three) times daily as needed (indigestion).  06/25/17   Curt Bears, MD    Family History Family History  Problem Relation Age of Onset  . Stroke Mother   . Breast cancer Maternal Aunt   . Prostate cancer Maternal Grandfather     Social History Social History   Tobacco Use  . Smoking status: Former Smoker    Types: Cigarettes    Last attempt to quit: 03/24/2017    Years since quitting: 0.3  . Smokeless tobacco: Never Used  Substance Use Topics  . Alcohol use: No  . Drug use: No     Allergies   Dextroamphetamine sulfate er and Other   Review of Systems Review of Systems  Ten systems reviewed and are negative for acute change, except as noted in the HPI.   Physical Exam Updated Vital Signs BP (!) 164/93 (BP Location: Left Arm)   Pulse 91   Temp 98.1 F  (36.7 C) (Oral)   Resp 18   Ht 5\' 1"  (1.549 m)   Wt 50.8 kg (112 lb)   SpO2 94%   BMI 21.16 kg/m   Physical Exam  Constitutional: She is oriented to person, place, and time. She appears well-developed and well-nourished. No distress.  HENT:  Head: Normocephalic and atraumatic.  Eyes: Conjunctivae are normal. No scleral icterus.  Neck: Normal range of motion.  Cardiovascular: Normal rate, regular rhythm and normal heart sounds. Exam reveals no gallop and no friction rub.  No murmur heard. Pulmonary/Chest: Effort normal and breath sounds normal. No respiratory distress. She exhibits tenderness.    Abdominal: Soft. Bowel sounds are normal. She exhibits no distension and no mass. There is no tenderness. There is no guarding.  Musculoskeletal:  No midline spinal tenderness  Neurological: She is alert and oriented to person, place, and time.  Skin: Skin is warm and dry. She is not diaphoretic.  Psychiatric: Her behavior is normal.  Nursing note and vitals reviewed.    ED Treatments / Results  Labs (all labs ordered are listed, but only abnormal results are displayed) Labs Reviewed - No data to display  EKG None  Radiology No results found.  Procedures Procedures (including critical care time)  Medications Ordered in ED Medications - No data to display   Initial Impression / Assessment and Plan / ED Course  I have reviewed the triage vital signs and the nursing notes.  Pertinent labs & imaging results that were available during my care of the patient were reviewed by me and considered in my medical decision making (see chart for details).    Patient x-ray shows no change in her previous fracture.  No rib fractures.  She is point tender over the ribs but there is no evidence of rib fractures or impending herpes zoster infection.  No midline spinal tenderness on examination for either myself or Dr. Tamera Punt.  Question if she is got some nerve irritation versus compression  given the occasional sharp and intense patient is having and the lateralized distribution on the left which would correlate with the T11 spinal fracture.  Patient pain medications increased.  She appears appropriate for discharge and will follow up with PCP and neurosurgery.  Her daughter is concerned about her home care.  She is currently being primarily cared for by a sister who is feeling overwhelmed.  I reached out to her case manager to discuss options with the patient's family  Final Clinical Impressions(s) / ED Diagnoses   Final diagnoses:  None    ED Discharge Orders    None       Margarita Mail, PA-C 08/13/17 2232    Malvin Johns, MD 08/14/17 0005

## 2017-08-14 NOTE — Care Management Note (Signed)
Case Management Note  Patient Details  Name: [name] MRN: [id number] Date of Birth: 1928-09-06  CM consulted by Kenton Kingfisher, PA to discuss the differences between [facility], SNF, and other options with the pt's step daughter Monico Hoar.  CM noted in chart review that a number of these things have already been discussed with the pt's granddaughter [name] who is HCPOA through the [place] at the [employer]    CM contacted Santiago Glad with [facility] to discuss current [facility] services in place and that a CSW will likely need to have another visit with pt and family.  CM contacted Ms. Graves who advised she has everything under control, despite what the rest of the family may be saying.  She advised the pt has a follow up appointment with Dr. Arelia Sneddon today at 11:30 and they will be discussing palliative care.  She additionally stated the pt was denied for regular Medicaid because she makes too much monthly ($1573) but she is going back to DSS to apply for the elder (LTC?) Medicaid and will also be reaching out to [facility] for assistance since she knows that is where she would like the pt to reside (her other grandmother who she is also HCPOA resides there currently).  CM advised her that if she had any questions or needed additional help to reach out to [facility].    No further CM needs noted at this time.  Expected Discharge Date:    08/14/2017              Expected Discharge Plan:  Central  Discharge planning Services  CM Consult  Post Acute Care Choice:  Home Health Choice offered to:  Patient, [facility] POA / Guardian, Sibling, Adult Children  HH Arranged:  RN, PT [place] Agency:  [place]  Status of Service:  Completed, signed off  Rae Mar, RN 08/14/2017, 11:02 AM

## 2017-09-28 ENCOUNTER — Telehealth: Payer: Self-pay

## 2017-09-28 NOTE — Telephone Encounter (Signed)
Pt called requesting information regarding upcoming appointments. Explained that appt on 6/7 at 1000 was for lab work and appt on 6/10 at 41 was for a visit with Dr. Julien Nordmann. Pt verbalized understanding and mentioned CT scan. Explained that CT was ordered, but not yet scheduled. Gave pt number for central scheduling [phone] if it gets close to pt appt and she wants to call. Pt expects call from scheduling, but took number "just in case." Pt thanked this RN for phone call.

## 2017-10-26 ENCOUNTER — Encounter (HOSPITAL_COMMUNITY): Payer: Self-pay

## 2017-10-26 ENCOUNTER — Inpatient Hospital Stay: Payer: Medicare Other | Attending: Internal Medicine

## 2017-10-26 ENCOUNTER — Ambulatory Visit (HOSPITAL_COMMUNITY)
Admission: RE | Admit: 2017-10-26 | Discharge: 2017-10-26 | Disposition: A | Discharge status: Home / Self Care | Payer: Medicare Other | Source: Ambulatory Visit | Attending: Internal Medicine | Admitting: Internal Medicine

## 2017-10-26 DIAGNOSIS — S2231XA Fracture of one rib, right side, initial encounter for closed fracture: Secondary | ICD-10-CM | POA: Insufficient documentation

## 2017-10-26 DIAGNOSIS — C3412 Malignant neoplasm of upper lobe, left bronchus or lung: Secondary | ICD-10-CM | POA: Insufficient documentation

## 2017-10-26 DIAGNOSIS — I700 Atherosclerosis of aorta: Secondary | ICD-10-CM | POA: Diagnosis not present

## 2017-10-26 DIAGNOSIS — I2510 Atherosclerotic heart disease of native coronary artery without angina pectoris: Secondary | ICD-10-CM | POA: Diagnosis not present

## 2017-10-26 DIAGNOSIS — R918 Other nonspecific abnormal finding of lung field: Secondary | ICD-10-CM | POA: Diagnosis not present

## 2017-10-26 DIAGNOSIS — K208 Other esophagitis: Secondary | ICD-10-CM | POA: Insufficient documentation

## 2017-10-26 DIAGNOSIS — C3490 Malignant neoplasm of unspecified part of unspecified bronchus or lung: Secondary | ICD-10-CM

## 2017-10-26 DIAGNOSIS — Z9221 Personal history of antineoplastic chemotherapy: Secondary | ICD-10-CM | POA: Diagnosis not present

## 2017-10-26 DIAGNOSIS — Z923 Personal history of irradiation: Secondary | ICD-10-CM | POA: Diagnosis not present

## 2017-10-26 DIAGNOSIS — J439 Emphysema, unspecified: Secondary | ICD-10-CM | POA: Insufficient documentation

## 2017-10-26 DIAGNOSIS — R1319 Other dysphagia: Secondary | ICD-10-CM | POA: Insufficient documentation

## 2017-10-26 LAB — CBC WITH DIFFERENTIAL (CANCER CENTER ONLY)
Basophils Absolute: 0.1 10*3/uL (ref 0.0–0.1)
Basophils Relative: 2 %
EOS ABS: 0.1 10*3/uL (ref 0.0–0.5)
EOS PCT: 2 %
HCT: 39.7 % (ref 34.8–46.6)
Hemoglobin: 13.2 g/dL (ref 11.6–15.9)
LYMPHS ABS: 0.9 10*3/uL (ref 0.9–3.3)
LYMPHS PCT: 23 %
MCH: 30.6 pg (ref 25.1–34.0)
MCHC: 33.2 g/dL (ref 31.5–36.0)
MCV: 92.1 fL (ref 79.5–101.0)
MONO ABS: 0.4 10*3/uL (ref 0.1–0.9)
MONOS PCT: 10 %
Neutro Abs: 2.6 10*3/uL (ref 1.5–6.5)
Neutrophils Relative %: 63 %
PLATELETS: 181 10*3/uL (ref 145–400)
RBC: 4.31 MIL/uL (ref 3.70–5.45)
RDW: 14.1 % (ref 11.2–14.5)
WBC Count: 4 10*3/uL (ref 3.9–10.3)

## 2017-10-26 LAB — CMP (CANCER CENTER ONLY)
ALBUMIN: 3.7 g/dL (ref 3.5–5.0)
ALT: 9 U/L (ref 0–55)
AST: 18 U/L (ref 5–34)
Alkaline Phosphatase: 111 U/L (ref 40–150)
Anion gap: 10 (ref 3–11)
BUN: 21 mg/dL (ref 7–26)
CHLORIDE: 100 mmol/L (ref 98–109)
CO2: 26 mmol/L (ref 22–29)
CREATININE: 0.97 mg/dL (ref 0.60–1.10)
Calcium: 9.4 mg/dL (ref 8.4–10.4)
GFR, Est AFR Am: 60 mL/min — ABNORMAL LOW (ref >60)
GFR, Estimated: 51 mL/min — ABNORMAL LOW (ref >60)
GLUCOSE: 131 mg/dL (ref 70–140)
POTASSIUM: 4.3 mmol/L (ref 3.5–5.1)
Sodium: 136 mmol/L (ref 136–145)
Total Bilirubin: 0.4 mg/dL (ref 0.2–1.2)
Total Protein: 7.6 g/dL (ref 6.4–8.3)

## 2017-10-26 MED ORDER — IOHEXOL 300 MG/ML  SOLN
75.0000 mL | Freq: Once | INTRAMUSCULAR | Status: AC | PRN
  Administered 2017-10-26: 75 mL via INTRAVENOUS

## 2017-10-29 ENCOUNTER — Telehealth: Payer: Self-pay | Admitting: Internal Medicine

## 2017-10-29 ENCOUNTER — Inpatient Hospital Stay (HOSPITAL_BASED_OUTPATIENT_CLINIC_OR_DEPARTMENT_OTHER): Payer: Medicare Other | Admitting: Internal Medicine

## 2017-10-29 ENCOUNTER — Encounter: Payer: Self-pay | Admitting: Internal Medicine

## 2017-10-29 VITALS — BP 144/67 | HR 77 | Temp 98.3°F | Resp 17 | Ht 61.0 in | Wt 109.6 lb

## 2017-10-29 DIAGNOSIS — C3490 Malignant neoplasm of unspecified part of unspecified bronchus or lung: Secondary | ICD-10-CM

## 2017-10-29 DIAGNOSIS — C3412 Malignant neoplasm of upper lobe, left bronchus or lung: Secondary | ICD-10-CM

## 2017-10-29 NOTE — Addendum Note (Signed)
Addended by: Ardeen Garland on: 10/29/2017 01:36 PM   Modules accepted: Orders

## 2017-10-29 NOTE — Telephone Encounter (Signed)
Scheduled appt per 6/10 los - Gave patient AVS and calender per los. - Central radiology to contact patient with ct scan .

## 2017-10-29 NOTE — Progress Notes (Signed)
[place] [phone]   [phone]  OFFICE PROGRESS NOTE  Leonard Downing, MD [place] [address]  DIAGNOSIS: Stage IIIA (T2b, N2, M0) non-small cell lung cancer, squamous cell carcinoma presented with left upper lobe lung mass left hilar and mediastinal lymphadenopathy diagnosed in November 2018. PDL 1 less than 1%.  PRIOR THERAPY: Concurrent chemoradiation with weekly carboplatin for AUC of 2 and paclitaxel 45 mg/M2.  First dose May 21, 2017, status post 5 cycles.  Last dose was given on June 18, 2017.  CURRENT THERAPY: Observation.  INTERVAL HISTORY: [name] 84 y.o. female returns to the clinic today for 3 months follow-up visit accompanied by her caregiver.  The patient is feeling very well today with no specific complaints.  She denied having any chest pain, shortness of breath, cough or hemoptysis.  She denied having any nausea, vomiting, diarrhea or constipation.  She lost few pounds since her last visit.  The patient denied having any headache or visual changes.  She had repeat CT scan of the chest performed recently and she is here for evaluation and discussion of her risk her results.  MEDICAL HISTORY: Past Medical History:  Diagnosis Date  . Abdominal aortic atherosclerosis ([place])   . CAD (coronary artery disease)   . Cancer ([place])   . Carotid artery occlusion   . Common bile duct dilation   . Gallstones   . Headache(784.0)   . Hiatal hernia   . HLD (hyperlipidemia)   . Myocardial infarction ([place])   . PONV (postoperative nausea and vomiting)   . Stroke [facility]) 2003    ALLERGIES:  is allergic to dextroamphetamine sulfate er and other.  MEDICATIONS:  Current Outpatient Medications  Medication Sig Dispense Refill  . ALPRAZolam (XANAX) 0.25 MG tablet Take 0.5 tablets (0.125 mg total) by mouth 2 (two) times daily as needed for anxiety. 10 tablet 0  . aspirin 81 MG EC tablet Take 81  mg by mouth daily.      . feeding supplement, ENSURE ENLIVE, (ENSURE ENLIVE) LIQD Take 237 mLs 2 (two) times daily between meals by mouth. (Patient not taking: Reported on 08/13/2017) 30 Bottle 0  . methylPREDNISolone (MEDROL) 4 MG TBPK tablet Take per package directions (Patient not taking: Reported on 08/13/2017) 21 tablet 0  . Multiple Vitamins-Minerals (HM MULTIVITAMIN ADULT GUMMY PO) Take 1 tablet by mouth daily.    . nitroGLYCERIN (NITROSTAT) 0.4 MG SL tablet Place 0.4 mg under the tongue every 5 (five) minutes as needed for chest pain.    Marland Kitchen oxyCODONE-acetaminophen (PERCOCET) 5-325 MG tablet Take 1 tablet by mouth every 6 (six) hours as needed for severe pain. 12 tablet 0  . PARoxetine (PAXIL) 20 MG tablet Take 30 mg by mouth at bedtime.     . polyethylene glycol (MIRALAX / GLYCOLAX) packet Take 17 g daily as needed by mouth. (Patient taking differently: Take 17 g by mouth daily as needed for moderate constipation. ) 14 each 0  . prochlorperazine (COMPAZINE) 10 MG tablet Take 1 tablet (10 mg total) by mouth every 6 (six) hours as needed for nausea or vomiting. 30 tablet 0  . protein supplement shake (PREMIER PROTEIN) LIQD Take 2 oz by mouth daily.    . sucralfate (CARAFATE) 1 GM/10ML suspension Take 10 mLs (1 g total) by mouth 4 (four) times daily -  with meals and at bedtime. (Patient taking differently: Take 1 g by mouth 3 (three) times daily  as needed (indigestion). ) 420 mL 0   No current facility-administered medications for this visit.     SURGICAL HISTORY:  Past Surgical History:  Procedure Laterality Date  . ABDOMINAL HYSTERECTOMY    . ANKLE FRACTURE SURGERY    . APPENDECTOMY    . BLADDER SURGERY    . CHOLECYSTECTOMY N/A 05/05/2013   Procedure: LAPAROSCOPIC CHOLECYSTECTOMY WITH INTRAOPERATIVE CHOLANGIOGRAM;  Surgeon: Ralene Ok, MD;  Location: [place];  Service: General;  Laterality: N/A;  . HIP ARTHROPLASTY Right 01/10/2014   Procedure: RIGHT HIP HEMIARTHROPLASTY;  Surgeon:  Johnn Hai, MD;  Location: [facility];  Service: Orthopedics;  Laterality: Right;  DEPUY AML LATERAL WITH MARK II  . ROTATOR CUFF REPAIR Left   . TONSILLECTOMY      REVIEW OF SYSTEMS:  A comprehensive review of systems was negative except for: Constitutional: positive for weight loss   PHYSICAL EXAMINATION: General appearance: alert, cooperative and no distress Head: Normocephalic, without obvious abnormality, atraumatic Neck: no adenopathy, no JVD, supple, symmetrical, trachea midline and thyroid not enlarged, symmetric, no tenderness/mass/nodules Lymph nodes: Cervical, supraclavicular, and axillary nodes normal. Resp: clear to auscultation bilaterally Back: symmetric, no curvature. ROM normal. No CVA tenderness. Cardio: regular rate and rhythm, S1, S2 normal, no murmur, click, rub or gallop GI: soft, non-tender; bowel sounds normal; no masses,  no organomegaly Extremities: extremities normal, atraumatic, no cyanosis or edema    ECOG PERFORMANCE STATUS: 1 - Symptomatic but completely ambulatory  Blood pressure (!) 144/67, pulse 77, temperature 98.3 F (36.8 C), temperature source Oral, resp. rate 17, height 5\' 1"  (1.549 m), weight 109 lb 9.6 oz (49.7 kg), SpO2 98 %.  LABORATORY DATA: Lab Results  Component Value Date   WBC 4.0 10/26/2017   HGB 13.2 10/26/2017   HCT 39.7 10/26/2017   MCV 92.1 10/26/2017   PLT 181 10/26/2017      Chemistry      Component Value Date/Time   NA 136 10/26/2017 0941   NA 135 (L) 05/21/2017 0829   K 4.3 10/26/2017 0941   K 3.6 05/21/2017 0829   CL 100 10/26/2017 0941   CO2 26 10/26/2017 0941   CO2 27 05/21/2017 0829   BUN 21 10/26/2017 0941   BUN 22.6 05/21/2017 0829   CREATININE 0.97 10/26/2017 0941   CREATININE 1.1 05/21/2017 0829      Component Value Date/Time   CALCIUM 9.4 10/26/2017 0941   CALCIUM 11.3 (H) 05/21/2017 0829   ALKPHOS 111 10/26/2017 0941   ALKPHOS 143 05/21/2017 0829   AST 18 10/26/2017 0941   AST 14 05/21/2017 0829    ALT 9 10/26/2017 0941   ALT 15 05/21/2017 0829   BILITOT 0.4 10/26/2017 0941   BILITOT 0.38 05/21/2017 0829       RADIOGRAPHIC STUDIES: Ct Chest W Contrast  Result Date: 10/26/2017 CLINICAL DATA:  Lung cancer diagnosed in 2018. Non-small-cell. Restaging. Chemotherapy and radiation therapy complete. EXAM: CT CHEST WITH CONTRAST TECHNIQUE: Multidetector CT imaging of the chest was performed during intravenous contrast administration. CONTRAST:  38mL OMNIPAQUE IOHEXOL 300 MG/ML  SOLN COMPARISON:  07/23/2017 FINDINGS: Cardiovascular: Advanced aortic and branch vessel atherosclerosis. Ulcerative plaque throughout the thoracic aorta. Tortuous thoracic aorta. Normal heart size, without pericardial effusion. Multivessel coronary artery atherosclerosis. No central pulmonary embolism, on this non-dedicated study. Mediastinum/Nodes: No supraclavicular adenopathy. No mediastinal or hilar adenopathy. Previously described esophageal wall thickening has resolved. Lungs/Pleura: Minimal left pleural thickening with loculated fluid at the apex. Moderate centrilobular emphysema. 3 mm posterior right upper  lobe pulmonary nodule on image 56/7, similar. Minimal medial superior segment right lower lobe pulmonary opacity including on image 57/7, favored to be radiation induced. Medial left apical lung mass measures 3.8 x 4.0 cm on image 20/7 versus 3.7 x 4.3 cm on the prior exam at the same level. 3.9 x 2.1 cm on coronal image 69 today versus 4.1 x 2.8 cm at the same level on the prior. Similar extension of tumor to the adjacent T2-3 level. No gross osseous destruction. Throughout the left upper lobe and adjacent superior segment left lower lobe are new areas of ground-glass and airspace opacity. A portion of these are somewhat nodular. Example series 7 including image 44/7. Upper Abdomen: Minimal motion degradation. Normal imaged portions of the liver, spleen, stomach, pancreas, kidneys. Bilateral adrenal thickening.  Musculoskeletal: Osteopenia. Healing right posterior-lateral eleventh rib fracture on image 117/2. New. Slight increase in moderate T11 compression deformity. IMPRESSION: 1. Similar to slight decrease in size of left apical lung mass. 2. Patchy left-sided pulmonary opacities are favored to represent evolving radiation change. Multifocal infection could look similar. 3. No thoracic adenopathy. 4. Aortic atherosclerosis (ICD10-I70.0), coronary artery atherosclerosis and emphysema (ICD10-J43.9). 5. Right eleventh rib fracture, new in the interval. Correlate with interval trauma. No specific evidence of osseous metastasis. Electronically Signed   By: Abigail Miyamoto M.D.   On: 10/26/2017 12:34    ASSESSMENT AND PLAN: This is a very pleasant 83 years old white female with a stage IIIa non-small cell lung cancer, squamous cell carcinoma.  She she completed a course of concurrent chemoradiation with weekly carboplatin and paclitaxel status post 5 cycles. She has been tolerating this treatment well except for the significant odynophagia secondary to radiation induced esophagitis. The patient has partial response to this treatment and she is currently on observation. Repeat CT scan of the chest performed recently showed no concerning findings for disease progression.  I discussed the scan results with the patient and her caregiver.  I recommended for her to continue on observation with repeat CT scan of the chest in 3 months. She was advised to call immediately if she has any concerning symptoms in the interval. The patient voices understanding of current disease status and treatment options and is in agreement with the current care plan. All questions were answered. The patient knows to call the clinic with any problems, questions or concerns. We can certainly see the patient much sooner if necessary.  I spent 10 minutes counseling the patient face to face. The total time spent in the appointment was15  minutes.  Disclaimer: This note was dictated with voice recognition software. Similar sounding words can inadvertently be transcribed and may not be corrected upon review.

## 2018-01-06 ENCOUNTER — Encounter (HOSPITAL_COMMUNITY): Payer: Self-pay | Admitting: Emergency Medicine

## 2018-01-06 ENCOUNTER — Other Ambulatory Visit: Payer: Self-pay

## 2018-01-06 ENCOUNTER — Emergency Department (HOSPITAL_COMMUNITY): Payer: Medicare Other

## 2018-01-06 ENCOUNTER — Inpatient Hospital Stay (HOSPITAL_COMMUNITY): Payer: Medicare Other

## 2018-01-06 ENCOUNTER — Inpatient Hospital Stay (HOSPITAL_COMMUNITY)
Admission: EM | Admit: 2018-01-06 | Discharge: 2018-01-08 | DRG: 388 | Disposition: A | Discharge status: Home / Self Care | Payer: Medicare Other | Attending: Internal Medicine | Admitting: Internal Medicine

## 2018-01-06 DIAGNOSIS — Z79899 Other long term (current) drug therapy: Secondary | ICD-10-CM | POA: Diagnosis not present

## 2018-01-06 DIAGNOSIS — Z9221 Personal history of antineoplastic chemotherapy: Secondary | ICD-10-CM

## 2018-01-06 DIAGNOSIS — Z6820 Body mass index (BMI) 20.0-20.9, adult: Secondary | ICD-10-CM | POA: Diagnosis not present

## 2018-01-06 DIAGNOSIS — I10 Essential (primary) hypertension: Secondary | ICD-10-CM | POA: Diagnosis present

## 2018-01-06 DIAGNOSIS — Z8673 Personal history of transient ischemic attack (TIA), and cerebral infarction without residual deficits: Secondary | ICD-10-CM | POA: Diagnosis not present

## 2018-01-06 DIAGNOSIS — E440 Moderate protein-calorie malnutrition: Secondary | ICD-10-CM | POA: Diagnosis present

## 2018-01-06 DIAGNOSIS — Z888 Allergy status to other drugs, medicaments and biological substances status: Secondary | ICD-10-CM | POA: Diagnosis not present

## 2018-01-06 DIAGNOSIS — I252 Old myocardial infarction: Secondary | ICD-10-CM

## 2018-01-06 DIAGNOSIS — F17210 Nicotine dependence, cigarettes, uncomplicated: Secondary | ICD-10-CM | POA: Diagnosis present

## 2018-01-06 DIAGNOSIS — J690 Pneumonitis due to inhalation of food and vomit: Secondary | ICD-10-CM | POA: Diagnosis present

## 2018-01-06 DIAGNOSIS — C3412 Malignant neoplasm of upper lobe, left bronchus or lung: Secondary | ICD-10-CM | POA: Diagnosis present

## 2018-01-06 DIAGNOSIS — E876 Hypokalemia: Secondary | ICD-10-CM | POA: Diagnosis not present

## 2018-01-06 DIAGNOSIS — I2510 Atherosclerotic heart disease of native coronary artery without angina pectoris: Secondary | ICD-10-CM | POA: Diagnosis present

## 2018-01-06 DIAGNOSIS — I700 Atherosclerosis of aorta: Secondary | ICD-10-CM | POA: Diagnosis present

## 2018-01-06 DIAGNOSIS — Z7982 Long term (current) use of aspirin: Secondary | ICD-10-CM

## 2018-01-06 DIAGNOSIS — C3490 Malignant neoplasm of unspecified part of unspecified bronchus or lung: Secondary | ICD-10-CM | POA: Diagnosis present

## 2018-01-06 DIAGNOSIS — K56609 Unspecified intestinal obstruction, unspecified as to partial versus complete obstruction: Principal | ICD-10-CM | POA: Diagnosis present

## 2018-01-06 DIAGNOSIS — E785 Hyperlipidemia, unspecified: Secondary | ICD-10-CM | POA: Diagnosis present

## 2018-01-06 DIAGNOSIS — J189 Pneumonia, unspecified organism: Secondary | ICD-10-CM | POA: Diagnosis not present

## 2018-01-06 DIAGNOSIS — Z96642 Presence of left artificial hip joint: Secondary | ICD-10-CM | POA: Diagnosis present

## 2018-01-06 DIAGNOSIS — R1013 Epigastric pain: Secondary | ICD-10-CM | POA: Diagnosis present

## 2018-01-06 DIAGNOSIS — Z4659 Encounter for fitting and adjustment of other gastrointestinal appliance and device: Secondary | ICD-10-CM

## 2018-01-06 DIAGNOSIS — Z0189 Encounter for other specified special examinations: Secondary | ICD-10-CM

## 2018-01-06 LAB — COMPREHENSIVE METABOLIC PANEL
ALK PHOS: 102 U/L (ref 38–126)
ALT: 14 U/L (ref 0–44)
AST: 23 U/L (ref 15–41)
Albumin: 3.9 g/dL (ref 3.5–5.0)
Anion gap: 14 (ref 5–15)
BUN: 14 mg/dL (ref 8–23)
CALCIUM: 9.9 mg/dL (ref 8.9–10.3)
CHLORIDE: 101 mmol/L (ref 98–111)
CO2: 23 mmol/L (ref 22–32)
Creatinine, Ser: 1.09 mg/dL — ABNORMAL HIGH (ref 0.44–1.00)
GFR calc Af Amer: 51 mL/min — ABNORMAL LOW (ref >60)
GFR calc non Af Amer: 44 mL/min — ABNORMAL LOW (ref >60)
GLUCOSE: 147 mg/dL — AB (ref 70–99)
Potassium: 4.1 mmol/L (ref 3.5–5.1)
SODIUM: 138 mmol/L (ref 135–145)
Total Bilirubin: 0.8 mg/dL (ref 0.3–1.2)
Total Protein: 7.7 g/dL (ref 6.5–8.1)

## 2018-01-06 LAB — CBC
HCT: 45.5 % (ref 36.0–46.0)
Hemoglobin: 14.7 g/dL (ref 12.0–15.0)
MCH: 30.2 pg (ref 26.0–34.0)
MCHC: 32.3 g/dL (ref 30.0–36.0)
MCV: 93.6 fL (ref 78.0–100.0)
Platelets: 226 10*3/uL (ref 150–400)
RBC: 4.86 MIL/uL (ref 3.87–5.11)
RDW: 13.5 % (ref 11.5–15.5)
WBC: 8.2 10*3/uL (ref 4.0–10.5)

## 2018-01-06 LAB — I-STAT TROPONIN, ED: TROPONIN I, POC: 0.01 ng/mL (ref 0.00–0.08)

## 2018-01-06 LAB — LIPASE, BLOOD: LIPASE: 56 U/L — AB (ref 11–51)

## 2018-01-06 MED ORDER — ACETAMINOPHEN 650 MG RE SUPP: 650.0000 mg | Freq: Four times a day (QID) | RECTAL | Status: DC | PRN

## 2018-01-06 MED ORDER — ONDANSETRON HCL 4 MG/2ML IJ SOLN
4.0000 mg | Freq: Once | INTRAMUSCULAR | Status: AC
  Administered 2018-01-06: 4 mg via INTRAVENOUS
  Filled 2018-01-06: qty 2

## 2018-01-06 MED ORDER — PIPERACILLIN-TAZOBACTAM 3.375 G IVPB
3.3750 g | Freq: Three times a day (TID) | INTRAVENOUS | Status: DC
  Administered 2018-01-07 – 2018-01-08 (×5): 3.375 g via INTRAVENOUS
  Filled 2018-01-06 (×4): qty 50

## 2018-01-06 MED ORDER — FENTANYL CITRATE (PF) 100 MCG/2ML IJ SOLN
50.0000 ug | INTRAMUSCULAR | Status: DC | PRN
  Administered 2018-01-06 (×2): 50 ug via INTRAVENOUS
  Filled 2018-01-06 (×2): qty 2

## 2018-01-06 MED ORDER — PIPERACILLIN-TAZOBACTAM 3.375 G IVPB 30 MIN
3.3750 g | Freq: Once | INTRAVENOUS | Status: AC
  Administered 2018-01-06: 3.375 g via INTRAVENOUS
  Filled 2018-01-06 (×2): qty 50

## 2018-01-06 MED ORDER — ONDANSETRON HCL 4 MG/2ML IJ SOLN
INTRAMUSCULAR | Status: AC
  Filled 2018-01-06: qty 2

## 2018-01-06 MED ORDER — HYDROMORPHONE HCL 1 MG/ML IJ SOLN
0.5000 mg | INTRAMUSCULAR | Status: DC | PRN
  Administered 2018-01-06 – 2018-01-08 (×3): 0.5 mg via INTRAVENOUS
  Filled 2018-01-06 (×2): qty 0.5
  Filled 2018-01-06: qty 1

## 2018-01-06 MED ORDER — ACETAMINOPHEN 325 MG PO TABS: 650.0000 mg | ORAL_TABLET | Freq: Four times a day (QID) | ORAL | Status: DC | PRN

## 2018-01-06 MED ORDER — ONDANSETRON HCL 4 MG PO TABS: 4.0000 mg | ORAL_TABLET | Freq: Four times a day (QID) | ORAL | Status: DC | PRN

## 2018-01-06 MED ORDER — ONDANSETRON HCL 4 MG/2ML IJ SOLN
4.0000 mg | Freq: Once | INTRAMUSCULAR | Status: AC
  Administered 2018-01-06: 4 mg via INTRAVENOUS

## 2018-01-06 MED ORDER — IOPAMIDOL (ISOVUE-370) INJECTION 76%
INTRAVENOUS | Status: AC
  Administered 2018-01-06: 60 mL
  Filled 2018-01-06: qty 100

## 2018-01-06 MED ORDER — ONDANSETRON HCL 4 MG/2ML IJ SOLN
4.0000 mg | Freq: Four times a day (QID) | INTRAMUSCULAR | Status: DC | PRN
  Administered 2018-01-07: 4 mg via INTRAVENOUS
  Filled 2018-01-06: qty 2

## 2018-01-06 MED ORDER — SODIUM CHLORIDE 0.9 % IV SOLN
INTRAVENOUS | Status: DC
  Administered 2018-01-06: 17:00:00 via INTRAVENOUS

## 2018-01-06 MED ORDER — FENTANYL CITRATE (PF) 100 MCG/2ML IJ SOLN: 25.0000 ug | INTRAMUSCULAR | Status: DC | PRN

## 2018-01-06 MED ORDER — SODIUM CHLORIDE 0.9 % IV SOLN
INTRAVENOUS | Status: AC
  Administered 2018-01-06: 21:00:00 via INTRAVENOUS

## 2018-01-06 MED ORDER — SODIUM CHLORIDE 0.9 % IV SOLN
1.0000 g | Freq: Once | INTRAVENOUS | Status: DC
  Filled 2018-01-06: qty 10

## 2018-01-06 MED ORDER — SODIUM CHLORIDE 0.9 % IV SOLN: INTRAVENOUS | Status: DC

## 2018-01-06 MED ORDER — ONDANSETRON HCL 4 MG/5ML PO SOLN: 4.0000 mg | Freq: Once | ORAL | Status: DC

## 2018-01-06 MED ORDER — METOCLOPRAMIDE HCL 5 MG/ML IJ SOLN
5.0000 mg | Freq: Once | INTRAMUSCULAR | Status: DC
  Administered 2018-01-06: 5 mg via INTRAVENOUS
  Filled 2018-01-06: qty 2

## 2018-01-06 NOTE — H&P (Signed)
History and Physical    [name] ZJI:[id number] DOB: 05-19-1929 DOA: 01/06/2018  PCP: Leonard Downing, MD  Patient coming from: Home.  Chief Complaint: Abdominal pain nausea vomiting.  HPI: [name] is a 84 y.o. female with history of non-small cell lung cancer being followed by Dr. Julien Nordmann presently under observation after patient received chemotherapy, history of hypertension presently not on medication was brought to the ER after patient had persistent nausea vomiting with abdominal discomfort since this afternoon.  Last bowel movement was this morning.  Pain is progressively worsened involving the whole abdomen.  Abdomen is distended.  Multiple bouts of vomiting.  Denies any blood in the vomitus.  ED Course: CT abdomen pelvis done in the ER shows features concerning for bowel obstruction.  On-call general surgeon Dr. Ninfa Linden was consulted.  Patient admitted for further management.  Review of Systems: As per HPI, rest all negative.   Past Medical History:  Diagnosis Date  . Abdominal aortic atherosclerosis ([place])   . CAD (coronary artery disease)   . Cancer ([place])   . Carotid artery occlusion   . Common bile duct dilation   . Gallstones   . Headache(784.0)   . Hiatal hernia   . HLD (hyperlipidemia)   . Myocardial infarction ([place])   . PONV (postoperative nausea and vomiting)   . Stroke [facility]) 2003    Past Surgical History:  Procedure Laterality Date  . ABDOMINAL HYSTERECTOMY    . ANKLE FRACTURE SURGERY    . APPENDECTOMY    . BLADDER SURGERY    . CHOLECYSTECTOMY N/A 05/05/2013   Procedure: LAPAROSCOPIC CHOLECYSTECTOMY WITH INTRAOPERATIVE CHOLANGIOGRAM;  Surgeon: Ralene Ok, MD;  Location: [place];  Service: General;  Laterality: N/A;  . HIP ARTHROPLASTY Right 01/10/2014   Procedure: RIGHT HIP HEMIARTHROPLASTY;  Surgeon: Johnn Hai, MD;  Location[facility];  Service: Orthopedics;  Laterality: Right;  DEPUY AML LATERAL WITH MARK II  . ROTATOR  CUFF REPAIR Left   . TONSILLECTOMY       reports that she has been smoking cigarettes. She has never used smokeless tobacco. She reports that she drinks alcohol. She reports that she does not use drugs.  Allergies  Allergen Reactions  . Dextroamphetamine Sulfate Er Other (See Comments)    Euphoric feeling    Family History  Problem Relation Age of Onset  . Stroke Mother   . Breast cancer Maternal Aunt   . Prostate cancer Maternal Grandfather     Prior to Admission medications   Medication Sig Start Date End Date Taking? Authorizing Provider  aspirin 81 MG EC tablet Take 81 mg by mouth daily.     Yes [provider]  busPIRone (BUSPAR) 10 MG tablet Take 10 mg by mouth daily as needed (anxiety).  10/12/17  Yes [provider]  ibuprofen (ADVIL,MOTRIN) 200 MG tablet Take 400 mg by mouth every 6 (six) hours as needed for headache.   Yes [provider]  polyethylene glycol (MIRALAX / GLYCOLAX) packet Take 17 g daily as needed by mouth. Patient taking differently: Take 17 g by mouth daily as needed for moderate constipation. Mix in 8 oz liquid and drink 04/04/17  Yes Mariel Aloe, MD  protein supplement shake (PREMIER PROTEIN) LIQD Take 2 oz by mouth daily with breakfast.   Yes [provider]  ALPRAZolam (XANAX) 0.25 MG tablet Take 0.5 tablets (0.125 mg total) by mouth 2 (two) times daily as needed for anxiety. Patient not taking: Reported on  01/06/2018 05/14/17   Lavina Hamman, MD  feeding supplement, ENSURE ENLIVE, (ENSURE ENLIVE) LIQD Take 237 mLs 2 (two) times daily between meals by mouth. Patient not taking: Reported on 01/06/2018 04/05/17   Mariel Aloe, MD  prochlorperazine (COMPAZINE) 10 MG tablet Take 1 tablet (10 mg total) by mouth every 6 (six) hours as needed for nausea or vomiting. Patient not taking: Reported on 10/29/2017 05/03/17   Maryanna Shape, NP    Physical Exam: Vitals:   01/06/18 1700 01/06/18 1930 01/06/18 2015  01/06/18 2030  BP: (!) 197/77 (!) 193/82 (!) 219/99 (!) 179/92  Pulse: 80 81 94 87  Resp:  18    Temp:      TempSrc:      SpO2: 96% 95% 97% 96%      Constitutional: Moderately built and nourished. Vitals:   01/06/18 1700 01/06/18 1930 01/06/18 2015 01/06/18 2030  BP: (!) 197/77 (!) 193/82 (!) 219/99 (!) 179/92  Pulse: 80 81 94 87  Resp:  18    Temp:      TempSrc:      SpO2: 96% 95% 97% 96%   Eyes: Anicteric no pallor. ENMT: No discharge from the ears eyes nose or mouth. Neck: No mass felt.  No neck rigidity.  No JVD appreciated. Respiratory: No rhonchi or crepitations. Cardiovascular: S1-S2 heard no murmurs appreciated. Abdomen: Soft distended bowel sounds not appreciated no guarding or rigidity. Musculoskeletal: No edema.  No joint effusion. Skin: No rash.  Skin appears warm. Neurologic: Alert awake oriented to time place and person.  Moves all extremities. Psychiatric: Appears normal.  Normal affect.   Labs on Admission: I have personally reviewed following labs and imaging studies  CBC: Recent Labs  Lab 01/06/18 1610  WBC 8.2  HGB 14.7  HCT 45.5  MCV 93.6  PLT 665   Basic Metabolic Panel: Recent Labs  Lab 01/06/18 1610  NA 138  K 4.1  CL 101  CO2 23  GLUCOSE 147*  BUN 14  CREATININE 1.09*  CALCIUM 9.9   GFR: CrCl cannot be calculated (Unknown ideal weight.). Liver Function Tests: Recent Labs  Lab 01/06/18 1610  AST 23  ALT 14  ALKPHOS 102  BILITOT 0.8  PROT 7.7  ALBUMIN 3.9   Recent Labs  Lab 01/06/18 1610  LIPASE 56*   No results for input(s): AMMONIA in the last 168 hours. Coagulation Profile: No results for input(s): INR, PROTIME in the last 168 hours. Cardiac Enzymes: No results for input(s): CKTOTAL, CKMB, CKMBINDEX, TROPONINI in the last 168 hours. BNP (last 3 results) No results for input(s): PROBNP in the last 8760 hours. HbA1C: No results for input(s): HGBA1C in the last 72 hours. CBG: No results for input(s): GLUCAP in  the last 168 hours. Lipid Profile: No results for input(s): CHOL, HDL, LDLCALC, TRIG, CHOLHDL, LDLDIRECT in the last 72 hours. Thyroid Function Tests: No results for input(s): TSH, T4TOTAL, FREET4, T3FREE, THYROIDAB in the last 72 hours. Anemia Panel: No results for input(s): VITAMINB12, FOLATE, FERRITIN, TIBC, IRON, RETICCTPCT in the last 72 hours. Urine analysis:    Component Value Date/Time   COLORURINE YELLOW 05/14/2017 0402   APPEARANCEUR HAZY (A) 05/14/2017 0402   LABSPEC 1.009 05/14/2017 0402   PHURINE 7.0 05/14/2017 0402   GLUCOSEU NEGATIVE 05/14/2017 0402   HGBUR NEGATIVE 05/14/2017 0402   BILIRUBINUR NEGATIVE 05/14/2017 0402   KETONESUR NEGATIVE 05/14/2017 0402   PROTEINUR NEGATIVE 05/14/2017 0402   UROBILINOGEN 0.2 01/09/2014 1448   NITRITE POSITIVE (A)  05/14/2017 0402   LEUKOCYTESUR NEGATIVE 05/14/2017 0402   Sepsis Labs: @LABRCNTIP (procalcitonin:4,lacticidven:4) )No results found for this or any previous visit (from the past 240 hour(s)).   Radiological Exams on Admission: Ct Angio Chest/abd/pel For Dissection W And/or Wo Contrast  Result Date: 01/06/2018 CLINICAL DATA:  Severe abdominal pain radiating to lower back since last night. Nausea. History of lung cancer diagnosed in 2018, appendectomy, bladder surgery. EXAM: CT ANGIOGRAPHY CHEST, ABDOMEN AND PELVIS TECHNIQUE: Multidetector CT imaging through the chest, abdomen and pelvis was performed using the standard protocol during bolus administration of intravenous contrast. Multiplanar reconstructed images and MIPs were obtained and reviewed to evaluate the vascular anatomy. CONTRAST:  58mL ISOVUE-370 IOPAMIDOL (ISOVUE-370) INJECTION 76% COMPARISON:  CT chest October 26, 2017 FINDINGS: CTA CHEST FINDINGS CARDIOVASCULAR: Thoracic aorta is normal course and caliber. Severe intimal thickening and calcific atherosclerosis. No intrinsic density on noncontrast CT. Homogeneous contrast opacification of thoracic aorta without  dissection, aneurysm, luminal irregularity, periaortic fluid collections, or contrast extravasation. Heart size is normal. Moderate coronary artery calcification. Small pericardial effusion. MEDIASTINUM/NODES: No mediastinal mass or lymphadenopathy by CT size criteria. LUNGS/PLEURA: Tracheobronchial tree is patent, no pneumothorax. Patchy LEFT upper and LEFT lower lobe consolidation with bilateral upper lobe centrilobular ground-glass and subsolid nodules measuring to 4 mm. Patchy ground-glass airspace opacities. Asymmetric LEFT apical pleural thickening, consolidation or mass. Punctate calcified granulomas. Mild centrilobular emphysema. MUSCULOSKELETAL: Non-suspicious. Osteopenia. Severe old T11 burst fracture. Subacute to old RIGHT eleventh rib fracture. Review of the MIP images confirms the above findings. CTA ABDOMEN AND PELVIS FINDINGS VASCULAR Aorta: Abdominal aorta is normal course and caliber. Moderate to severe calcific atherosclerosis. Homogeneous contrast opacification of aortoiliac vessels without dissection, aneurysm, luminal irregularity, periaortic fluid collections, or contrast extravasation. Celiac: Patent. SMA: Patent. Renals: Patent. IMA: Patent. Inflow: Negative. Veins: Negative, not tailored for evaluation. Review of the MIP images confirms the above findings. NON-VASCULAR HEPATOBILIARY: Status post cholecystectomy.  Normal liver. PANCREAS: Normal. SPLEEN: Normal. ADRENALS/URINARY TRACT: Kidneys are orthotopic, demonstrating symmetric enhancement. No nephrolithiasis, hydronephrosis or solid renal masses. The unopacified ureters are normal in course and caliber. Urinary bladder is partially distended and unremarkable. Nodular thickening associated with cortical hyperplasia. STOMACH/BOWEL: Small hiatal hernia. Moderate colonic diverticulosis. Dilated small bowel to 3.3 cm with air-fluid levels. At least 2 transition points (RIGHT mid abdomen, LEFT upper quadrant. VASCULAR/LYMPHATIC: No  lymphadenopathy by CT size criteria. REPRODUCTIVE: Normal. OTHER: Small volume low-density ascites. MUSCULOSKELETAL: Nonacute. Streak artifact from RIGHT hip arthroplasty. Small fat containing umbilical hernia. Review of the MIP images confirms the above findings. IMPRESSION: CTA CHEST: 1. No acute vascular process.  Severe atherosclerosis, unchanged. 2. 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 3. LEFT mass corresponding to known primary cancer. Similar scattered sub solid/ground-glass pulmonary nodules. CTA ABDOMEN AND PELVIS: 1. No acute vascular process. 2. Findings of small bowel obstruction, however given multiple transition points, this could reflect ileus. 3. Colonic diverticulosis without acute diverticulitis. Aortic Atherosclerosis (ICD10-I70.0). Emphysema (ICD10-J43.9). Electronically Signed   By: Elon Alas M.D.   On: 01/06/2018 18:47     Assessment/Plan Principal Problem:   SBO (small bowel obstruction) (HCC) Active Problems:   Hypertension   Malignant neoplasm of bronchus of left upper lobe ([place])    1. Small bowel obstruction -I have ordered NG tube placement since patient still has abdominal pain and distention vomiting.  Will keep patient n.p.o. await general surgery consult and recommendations.  Gentle hydration pain medications.  KUB in the morning. 2. Hypertension we will keep patient on PRN  IV hydralazine for now. 3. History of non-small cell lung cancer being followed by Dr. Julien Nordmann. 4. Possible aspiration pneumonia on Zosyn.   DVT prophylaxis: SCDs. Code Status: Full code. Family Communication: Patient's daughter. Disposition Plan: Home. Consults called: General surgery. Admission status: Inpatient.   Rise Patience MD [facility] Pager [phone].  If 7PM-7AM, please contact night-coverage [email or web address] Password [email or web address]  01/06/2018, 9:00 PM

## 2018-01-06 NOTE — ED Notes (Signed)
Attempted to call report; nurse states will call me back.

## 2018-01-06 NOTE — ED Provider Notes (Signed)
[facility]ERGENCY DEPARTMENT Provider Note   CSN: [id number] Arrival date & time: 01/06/18  1546     History   Chief Complaint Chief Complaint  Patient presents with  . Abdominal Pain    HPI [name] is a 84 y.o. female.  Patient with history of CAD, stroke, gallstones, vascular disease, heart attack, multiple abdominal surgeries presents with severe abdominal pain and vomiting since proximately noon today.  More sudden onset.  No blood in vomit.  Normal stool earlier today.  Recurrent vomiting since.  Granddaughters with patient in the ED.  Patient is not provide too many details that she is very uncomfortable.  Located central and epigastric no history of aneurysm known.     Past Medical History:  Diagnosis Date  . Abdominal aortic atherosclerosis ([place])   . CAD (coronary artery disease)   . Cancer ([place])   . Carotid artery occlusion   . Common bile duct dilation   . Gallstones   . Headache(784.0)   . Hiatal hernia   . HLD (hyperlipidemia)   . Myocardial infarction ([place])   . PONV (postoperative nausea and vomiting)   . Stroke [facility]) 2003    Patient Active Problem List   Diagnosis Date Noted  . Hypercalcemia 05/21/2017  . MDD (major depressive disorder), recurrent episode, moderate ([place]) 05/11/2017  . Dehydration 05/10/2017  . Weakness 05/10/2017  . Encounter for antineoplastic chemotherapy 05/04/2017  . Goals of care, counseling/discussion 05/04/2017  . Malignant neoplasm of bronchus of left upper lobe ([place]) 05/03/2017  . Chest congestion 04/03/2017  . Constipation 04/03/2017  . Adenoma of left adrenal gland 04/03/2017  . SIADH (syndrome of inappropriate ADH production) ([place]) 04/02/2017  . Lung mass 04/02/2017  . Elevated AST (SGOT) 04/02/2017  . Leukocytosis 04/02/2017  . Normocytic anemia 04/02/2017  . Cerumen impaction 11/19/2015  . Hip fracture requiring operative repair ([place]) 01/09/2014  . Closed right hip fracture ([place])  01/09/2014  . Hyponatremia 01/09/2014  . Symptomatic cholelithiasis 03/20/2013  . Dilated bile duct 03/20/2013  . Abdominal aortic atherosclerosis ([place])   . Hypertension 10/19/2011  . Tobacco abuse 02/18/2009  . HLD (hyperlipidemia) 02/17/2009  . ANXIETY DISORDER 02/17/2009  . CAD (coronary artery disease) 02/17/2009  . CAROTID ARTERY OCCLUSION 02/17/2009  . CVA 02/17/2009    Past Surgical History:  Procedure Laterality Date  . ABDOMINAL HYSTERECTOMY    . ANKLE FRACTURE SURGERY    . APPENDECTOMY    . BLADDER SURGERY    . CHOLECYSTECTOMY N/A 05/05/2013   Procedure: LAPAROSCOPIC CHOLECYSTECTOMY WITH INTRAOPERATIVE CHOLANGIOGRAM;  Surgeon: Ralene Ok, MD;  Location: [facility];  Service: General;  Laterality: N/A;  . HIP ARTHROPLASTY Right 01/10/2014   Procedure: RIGHT HIP HEMIARTHROPLASTY;  Surgeon: Johnn Hai, MD;  Location: [facility];  Service: Orthopedics;  Laterality: Right;  DEPUY AML LATERAL WITH MARK II  . ROTATOR CUFF REPAIR Left   . TONSILLECTOMY       OB History   None      Home Medications    Prior to Admission medications   Medication Sig Start Date End Date Taking? Authorizing Provider  aspirin 81 MG EC tablet Take 81 mg by mouth daily.     Yes [provider]  busPIRone (BUSPAR) 10 MG tablet Take 10 mg by mouth daily as needed (anxiety).  10/12/17  Yes [provider]  ibuprofen (ADVIL,MOTRIN) 200 MG tablet Take 400 mg by mouth every 6 (six) hours as needed for headache.   Yes [provider]  polyethylene glycol (MIRALAX / GLYCOLAX) packet Take 17 g daily as needed by mouth. Patient taking differently: Take 17 g by mouth daily as needed for moderate constipation. Mix in 8 oz liquid and drink 04/04/17  Yes Mariel Aloe, MD  protein supplement shake (PREMIER PROTEIN) LIQD Take 2 oz by mouth daily with breakfast.   Yes [provider]  ALPRAZolam (XANAX) 0.25 MG tablet Take 0.5 tablets (0.125 mg total) by mouth 2 (two)  times daily as needed for anxiety. Patient not taking: Reported on 01/06/2018 05/14/17   Lavina Hamman, MD  feeding supplement, ENSURE ENLIVE, (ENSURE ENLIVE) LIQD Take 237 mLs 2 (two) times daily between meals by mouth. Patient not taking: Reported on 01/06/2018 04/05/17   Mariel Aloe, MD  prochlorperazine (COMPAZINE) 10 MG tablet Take 1 tablet (10 mg total) by mouth every 6 (six) hours as needed for nausea or vomiting. Patient not taking: Reported on 10/29/2017 05/03/17   Maryanna Shape, NP    Family History Family History  Problem Relation Age of Onset  . Stroke Mother   . Breast cancer Maternal Aunt   . Prostate cancer Maternal Grandfather     Social History Social History   Tobacco Use  . Smoking status: Current Every Day Smoker    Types: Cigarettes  . Smokeless tobacco: Never Used  Substance Use Topics  . Alcohol use: Yes  . Drug use: No     Allergies   Dextroamphetamine sulfate er   Review of Systems Review of Systems  Constitutional: Negative for chills and fever.  HENT: Negative for congestion.   Eyes: Negative for visual disturbance.  Respiratory: Negative for shortness of breath.   Cardiovascular: Negative for chest pain.  Gastrointestinal: Positive for abdominal pain, nausea and vomiting.  Genitourinary: Negative for dysuria and flank pain.  Musculoskeletal: Negative for back pain, neck pain and neck stiffness.  Skin: Negative for rash.  Neurological: Negative for light-headedness and headaches.     Physical Exam Updated Vital Signs BP (!) 193/82   Pulse 82   Temp 97.7 F (36.5 C) (Oral)   Resp 18   SpO2 95%   Physical Exam  Constitutional: She is oriented to person, place, and time. She appears well-developed and well-nourished.  HENT:  Head: Normocephalic and atraumatic.  Eyes: Conjunctivae are normal. Right eye exhibits no discharge. Left eye exhibits no discharge.  Neck: Normal range of motion. Neck supple. No tracheal deviation  present.  Cardiovascular: Normal rate and regular rhythm.  Pulmonary/Chest: Effort normal and breath sounds normal.  Abdominal: Soft. She exhibits no distension. There is tenderness (moderate central epigastric mild guarding). There is no guarding.  Musculoskeletal: She exhibits no edema.  Neurological: She is alert and oriented to person, place, and time.  Skin: Skin is warm. No rash noted.  Psychiatric: She has a normal mood and affect.  Nursing note and vitals reviewed.    ED Treatments / Results  Labs (all labs ordered are listed, but only abnormal results are displayed) Labs Reviewed  LIPASE, BLOOD - Abnormal; Notable for the following components:      Result Value   Lipase 56 (*)    All other components within normal limits  COMPREHENSIVE METABOLIC PANEL - Abnormal; Notable for the following components:   Glucose, Bld 147 (*)    Creatinine, Ser 1.09 (*)    GFR calc non Af Amer 44 (*)    GFR calc Af Amer 51 (*)    All other  components within normal limits  CULTURE, BLOOD (ROUTINE X 2)  CULTURE, BLOOD (ROUTINE X 2)  CBC  URINALYSIS, ROUTINE W REFLEX MICROSCOPIC  I-STAT TROPONIN, ED    EKG None  Radiology Ct Angio Chest/abd/pel For Dissection W And/or Wo Contrast  Result Date: 01/06/2018 CLINICAL DATA:  Severe abdominal pain radiating to lower back since last night. Nausea. History of lung cancer diagnosed in 2018, appendectomy, bladder surgery. EXAM: CT ANGIOGRAPHY CHEST, ABDOMEN AND PELVIS TECHNIQUE: Multidetector CT imaging through the chest, abdomen and pelvis was performed using the standard protocol during bolus administration of intravenous contrast. Multiplanar reconstructed images and MIPs were obtained and reviewed to evaluate the vascular anatomy. CONTRAST:  57mL ISOVUE-370 IOPAMIDOL (ISOVUE-370) INJECTION 76% COMPARISON:  CT chest October 26, 2017 FINDINGS: CTA CHEST FINDINGS CARDIOVASCULAR: Thoracic aorta is normal course and caliber. Severe intimal thickening and  calcific atherosclerosis. No intrinsic density on noncontrast CT. Homogeneous contrast opacification of thoracic aorta without dissection, aneurysm, luminal irregularity, periaortic fluid collections, or contrast extravasation. Heart size is normal. Moderate coronary artery calcification. Small pericardial effusion. MEDIASTINUM/NODES: No mediastinal mass or lymphadenopathy by CT size criteria. LUNGS/PLEURA: Tracheobronchial tree is patent, no pneumothorax. Patchy LEFT upper and LEFT lower lobe consolidation with bilateral upper lobe centrilobular ground-glass and subsolid nodules measuring to 4 mm. Patchy ground-glass airspace opacities. Asymmetric LEFT apical pleural thickening, consolidation or mass. Punctate calcified granulomas. Mild centrilobular emphysema. MUSCULOSKELETAL: Non-suspicious. Osteopenia. Severe old T11 burst fracture. Subacute to old RIGHT eleventh rib fracture. Review of the MIP images confirms the above findings. CTA ABDOMEN AND PELVIS FINDINGS VASCULAR Aorta: Abdominal aorta is normal course and caliber. Moderate to severe calcific atherosclerosis. Homogeneous contrast opacification of aortoiliac vessels without dissection, aneurysm, luminal irregularity, periaortic fluid collections, or contrast extravasation. Celiac: Patent. SMA: Patent. Renals: Patent. IMA: Patent. Inflow: Negative. Veins: Negative, not tailored for evaluation. Review of the MIP images confirms the above findings. NON-VASCULAR HEPATOBILIARY: Status post cholecystectomy.  Normal liver. PANCREAS: Normal. SPLEEN: Normal. ADRENALS/URINARY TRACT: Kidneys are orthotopic, demonstrating symmetric enhancement. No nephrolithiasis, hydronephrosis or solid renal masses. The unopacified ureters are normal in course and caliber. Urinary bladder is partially distended and unremarkable. Nodular thickening associated with cortical hyperplasia. STOMACH/BOWEL: Small hiatal hernia. Moderate colonic diverticulosis. Dilated small bowel to 3.3 cm  with air-fluid levels. At least 2 transition points (RIGHT mid abdomen, LEFT upper quadrant. VASCULAR/LYMPHATIC: No lymphadenopathy by CT size criteria. REPRODUCTIVE: Normal. OTHER: Small volume low-density ascites. MUSCULOSKELETAL: Nonacute. Streak artifact from RIGHT hip arthroplasty. Small fat containing umbilical hernia. Review of the MIP images confirms the above findings. IMPRESSION: CTA CHEST: 1. No acute vascular process.  Severe atherosclerosis, unchanged. 2. 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 3. LEFT mass corresponding to known primary cancer. Similar scattered sub solid/ground-glass pulmonary nodules. CTA ABDOMEN AND PELVIS: 1. No acute vascular process. 2. Findings of small bowel obstruction, however given multiple transition points, this could reflect ileus. 3. Colonic diverticulosis without acute diverticulitis. Aortic Atherosclerosis (ICD10-I70.0). Emphysema (ICD10-J43.9). Electronically Signed   By: Elon Alas M.D.   On: 01/06/2018 18:47    Procedures Procedures (including critical care time)  Medications Ordered in ED Medications  fentaNYL (SUBLIMAZE) injection 50 mcg (50 mcg Intravenous Given 01/06/18 1925)  0.9 %  sodium chloride infusion ( Intravenous New Bag/Given 01/06/18 1728)  ondansetron (ZOFRAN) injection 4 mg (has no administration in time range)  ondansetron (ZOFRAN) 4 MG/2ML injection (has no administration in time range)  cefTRIAXone (ROCEPHIN) 1 g in sodium chloride 0.9 % 100 mL IVPB (has no  administration in time range)  metoCLOPramide (REGLAN) injection 5 mg (has no administration in time range)  0.9 %  sodium chloride infusion (has no administration in time range)  ondansetron (ZOFRAN) injection 4 mg (4 mg Intravenous Given 01/06/18 1658)  iopamidol (ISOVUE-370) 76 % injection (60 mLs  Contrast Given 01/06/18 1730)     Initial Impression / Assessment and Plan / ED Course  I have reviewed the triage vital signs and  the nursing notes.  Pertinent labs & imaging results that were available during my care of the patient were reviewed by me and considered in my medical decision making (see chart for details).    Patient presents with severe abdominal pain mild radiation to the back.  With history of vascular disease age and guarding on exam differential diagnosis very concerning.  Discussed CT angiogram, blood work, pain meds.  Difficulty controlling patient's pain.  Repeat nausea medicines ordered.  CT scan results reviewed with patient and family concerning for bowel obstruction and pneumonia.  Antibiotics blood cultures ordered.  Discussed the case with the hospitalist as well as general surgery on consult.  With patient's significant medical history and age unlikely surgical candidate will admit for further treatment and monitoring.  Blood work reviewed mild renal insufficiency, normal white blood cell count.  The patients results and plan were reviewed and discussed.   Any x-rays performed were independently reviewed by myself.   Differential diagnosis were considered with the presenting HPI.  Medications  fentaNYL (SUBLIMAZE) injection 50 mcg (50 mcg Intravenous Given 01/06/18 1925)  0.9 %  sodium chloride infusion ( Intravenous New Bag/Given 01/06/18 1728)  ondansetron (ZOFRAN) injection 4 mg (has no administration in time range)  ondansetron (ZOFRAN) 4 MG/2ML injection (has no administration in time range)  cefTRIAXone (ROCEPHIN) 1 g in sodium chloride 0.9 % 100 mL IVPB (has no administration in time range)  metoCLOPramide (REGLAN) injection 5 mg (has no administration in time range)  0.9 %  sodium chloride infusion (has no administration in time range)  ondansetron (ZOFRAN) injection 4 mg (4 mg Intravenous Given 01/06/18 1658)  iopamidol (ISOVUE-370) 76 % injection (60 mLs  Contrast Given 01/06/18 1730)    Vitals:   01/06/18 1630 01/06/18 1645 01/06/18 1700 01/06/18 1930  BP: (!) 194/87 (!)  174/140 (!) 197/77 (!) 193/82  Pulse: 83 80 80 82  Resp:    18  Temp:      TempSrc:      SpO2: 98% 98% 96% 95%    Final diagnoses:  Epigastric pain  Small bowel obstruction ([place])  Community acquired pneumonia, unspecified laterality    Admission/ observation were discussed with the admitting physician, patient and/or family and they are comfortable with the plan.    Final Clinical Impressions(s) / ED Diagnoses   Final diagnoses:  Epigastric pain  Small bowel obstruction ([place])  Community acquired pneumonia, unspecified laterality    ED Discharge Orders    None       Elnora Morrison, MD 01/06/18 2002

## 2018-01-06 NOTE — ED Triage Notes (Signed)
C/o mid upper abd pain x 2 hours with nausea and vomiting.  Denies diarrhea.

## 2018-01-06 NOTE — Progress Notes (Signed)
Pharmacy Antibiotic Note  [name] is a 84 y.o. female admitted on 01/06/2018 with aspiration pneumonia.  Pharmacy has been consulted for zosyn dosing.  Presenting with severe abdominal pain and vomiting. CT scan concerning for bowel obstruction and pneumonia. WBC WNL, afebrile. Scr 1.09 (CrCl 27 mL/min).  Plan: Zosyn 3.375g IV q8h (4 hour infusion). Monitor renal fx, clinical pic, cx results, and LOT    Temp (24hrs), Avg:97.7 F (36.5 C), Min:97.7 F (36.5 C), Max:97.7 F (36.5 C)  Recent Labs  Lab 01/06/18 1610  WBC 8.2  CREATININE 1.09*    CrCl cannot be calculated (Unknown ideal weight.).    Allergies  Allergen Reactions  . Dextroamphetamine Sulfate Er Other (See Comments)    Euphoric feeling    Antimicrobials this admission: Zosyn 8/18 >>   Dose adjustments this admission: N/A  Microbiology results: 8/17 BCx: sent  Thank you for allowing pharmacy to be a part of this patient's care.  Doylene Canard, PharmD Clinical Pharmacist  Pager: [phone] Phone: [phone] 01/06/2018 9:27 PM

## 2018-01-07 ENCOUNTER — Other Ambulatory Visit: Payer: Self-pay

## 2018-01-07 ENCOUNTER — Inpatient Hospital Stay (HOSPITAL_COMMUNITY): Payer: Medicare Other

## 2018-01-07 DIAGNOSIS — K56609 Unspecified intestinal obstruction, unspecified as to partial versus complete obstruction: Principal | ICD-10-CM

## 2018-01-07 LAB — HEPATIC FUNCTION PANEL
ALBUMIN: 3.2 g/dL — AB (ref 3.5–5.0)
ALT: 13 U/L (ref 0–44)
AST: 26 U/L (ref 15–41)
Alkaline Phosphatase: 88 U/L (ref 38–126)
BILIRUBIN DIRECT: 0.3 mg/dL — AB (ref 0.0–0.2)
BILIRUBIN TOTAL: 0.8 mg/dL (ref 0.3–1.2)
Indirect Bilirubin: 0.5 mg/dL (ref 0.3–0.9)
Total Protein: 6.7 g/dL (ref 6.5–8.1)

## 2018-01-07 LAB — BASIC METABOLIC PANEL
Anion gap: 10 (ref 5–15)
BUN: 16 mg/dL (ref 8–23)
CALCIUM: 8.9 mg/dL (ref 8.9–10.3)
CO2: 29 mmol/L (ref 22–32)
CREATININE: 1.1 mg/dL — AB (ref 0.44–1.00)
Chloride: 102 mmol/L (ref 98–111)
GFR calc Af Amer: 51 mL/min — ABNORMAL LOW (ref >60)
GFR calc non Af Amer: 44 mL/min — ABNORMAL LOW (ref >60)
GLUCOSE: 105 mg/dL — AB (ref 70–99)
POTASSIUM: 4.3 mmol/L (ref 3.5–5.1)
Sodium: 141 mmol/L (ref 135–145)

## 2018-01-07 LAB — CBC
HEMATOCRIT: 42.2 % (ref 36.0–46.0)
Hemoglobin: 13.5 g/dL (ref 12.0–15.0)
MCH: 30 pg (ref 26.0–34.0)
MCHC: 32 g/dL (ref 30.0–36.0)
MCV: 93.8 fL (ref 78.0–100.0)
PLATELETS: 174 10*3/uL (ref 150–400)
RBC: 4.5 MIL/uL (ref 3.87–5.11)
RDW: 13.7 % (ref 11.5–15.5)
WBC: 6 10*3/uL (ref 4.0–10.5)

## 2018-01-07 MED ORDER — HYDRALAZINE HCL 20 MG/ML IJ SOLN: 5.0000 mg | INTRAMUSCULAR | Status: DC | PRN

## 2018-01-07 MED ORDER — DIATRIZOATE MEGLUMINE & SODIUM 66-10 % PO SOLN
90.0000 mL | Freq: Once | ORAL | Status: AC
  Administered 2018-01-07: 90 mL via NASOGASTRIC
  Filled 2018-01-07 (×2): qty 90

## 2018-01-07 NOTE — Progress Notes (Signed)
Initial Nutrition Assessment  DOCUMENTATION CODES:   Non-severe (moderate) malnutrition in context of chronic illness  INTERVENTION:   -RD will follow for diet advancement and supplement as appropriate -If prolonged NPO status is anticipated, consider initiation of nutrition support  NUTRITION DIAGNOSIS:   Moderate Malnutrition related to chronic illness(lung cancer) as evidenced by energy intake < or equal to 75% for > or equal to 1 month, mild fat depletion, moderate fat depletion, mild muscle depletion, moderate muscle depletion, percent weight loss.  GOAL:   Patient will meet greater than or equal to 90% of their needs  MONITOR:   PO intake, Supplement acceptance  REASON FOR ASSESSMENT:   Malnutrition Screening Tool    ASSESSMENT:   [name] is a 84 y.o. female with history of non-small cell lung cancer being followed by Dr. Julien Nordmann presently under observation after patient received chemotherapy, history of hypertension presently not on medication was brought to the ER after patient had persistent nausea vomiting with abdominal discomfort since this afternoon.  Last bowel movement was this morning.  Pain is progressively worsened involving the whole abdomen.  Abdomen is distended.  Multiple bouts of vomiting.  Denies any blood in the vomitus.  Pt admitted with SBO vs ileus.   8/18- NGT placed  Reviewed I/O's: -523 ml x 24 hours  NGT output: 800 ml output x 24 hours  Spoke with pt at bedside, who reports a general decline in health over the past 10 months. She shares that her appetite "hasn't been worth a darn" over the past 8 months related to chemotherapy and radiation side effects. She reports ongoing weight loss; noted pt has experienced a 16.8% wt loss in the past 9 months ("I know I've lost weight- I weighed 115# as a little girl and I'm 105# now").   Pt expressed concern with weight loss and decreased muscle tone.   Pt understanding of need for NGT and  NPO status. Pt was frustrated at time of visit, so did not probe for further hx and recommendations.   Labs reviewed.   NUTRITION - FOCUSED PHYSICAL EXAM:    Most Recent Value  Orbital Region  Mild depletion  Upper Arm Region  Moderate depletion  Thoracic and Lumbar Region  No depletion  Buccal Region  Mild depletion  Temple Region  Mild depletion  Clavicle Bone Region  Mild depletion  Clavicle and Acromion Bone Region  Mild depletion  Scapular Bone Region  Mild depletion  Dorsal Hand  Moderate depletion  Patellar Region  Moderate depletion  Anterior Thigh Region  Moderate depletion  Posterior Calf Region  Moderate depletion  Edema (RD Assessment)  Mild  Hair  Reviewed  Eyes  Reviewed  Mouth  Reviewed  Skin  Reviewed  Nails  Reviewed       Diet Order:   Diet Order            Diet NPO time specified Except for: Ice Chips  Diet effective now              EDUCATION NEEDS:   No education needs have been identified at this time  Skin:  Skin Assessment: Reviewed RN Assessment  Last BM:  01/06/18  Height:   Ht Readings from Last 1 Encounters:  01/06/18 5' 0.5" (1.537 m)    Weight:   Wt Readings from Last 1 Encounters:  01/06/18 47.7 kg    Ideal Body Weight:  46.6 kg  BMI:  Body mass index is 20.21 kg/m.  Estimated Nutritional Needs:   Kcal:  1250-1450  Protein:  60-75 grams  Fluid:  > 1.2 L    Laurette Villescas A. Jimmye Norman, RD, LDN, CDE Pager: [phone] After hours Pager: [phone]

## 2018-01-07 NOTE — Progress Notes (Signed)
PROGRESS NOTE    [name]  CWC:[id number] DOB: [birth year] DOA: 01/06/2018 PCP: Leonard Downing, MD   Brief Narrative: Patient is 84 year old female with past medical history of non-small cell lung cancer followed by oncology, hypertension who was brought to the emergency department after she had persistent nausea and vomiting with abdominal discomfort.  Found to have distended abdomen on presentation.  CT abdomen/pelvis done in the emerge department showed concern for bowel obstruction.  Surgery consulted.  Currently being managed conservatively.  Assessment & Plan:   Principal Problem:   SBO (small bowel obstruction) (HCC) Active Problems:   Hypertension   Malignant neoplasm of bronchus of left upper lobe (HCC)   SBO: Currently on NG tube on suction.  Abdomen pain has resolved.  Patient does not have a bowel movement but she says she has been passing gas very slowly.  Continue n.p.o. General surgery following.  Undergoing abdominal x-ray today.  Continue hydration.  Pneumonia: CT showed possible multifocal pneumonia.  Likely aspiration pneumonia.  Started on Zosyn.  Currently patient is having hemodynamically stable, afebrile.  Does not complain of any chest pain or shortness of breath or cough  Hypertension: Continue PRN medication for now.  Currently normotensive  History of non-small cell lung cancer of left lung: Follows Dr. Julien Nordmann.  Status post chemotherapy.   DVT prophylaxis: ScD Code Status: Full Family Communication: None present at the bedside Disposition Plan: Likely home after resolution of his small bowel obstruction   Consultants: General surgery  Procedures: None  Antimicrobials: Zosyn  Subjective: Patient seen and examined the bedside this morning.  Remains comfortable.  Denies any abdominal pain, nausea or vomiting .Does not have a bowel movement  Objective: Vitals:   01/06/18 2100 01/06/18 2132 01/06/18 2240 01/07/18 0455  BP: (!) 173/92   (!) 189/85 (!) 141/88  Pulse: 82  90 94  Resp:   19 16  Temp:   98.1 F (36.7 C) 98.6 F (37 C)  TempSrc:   Oral Oral  SpO2: 97%  96% 94%  Weight:  49 kg 47.7 kg   Height:  5' 0.5" (1.537 m)      Intake/Output Summary (Last 24 hours) at 01/07/2018 1422 Last data filed at 01/07/2018 1240 Gross per 24 hour  Intake 0 ml  Output 800 ml  Net -800 ml   Filed Weights   01/06/18 2132 01/06/18 2240  Weight: 49 kg 47.7 kg    Examination:  General exam: Appears calm and comfortable ,Not in distress, thin built elderly lady HEENT:PERRL,Oral mucosa moist, Ear/Nose normal on gross exam Respiratory system: Bilateral equal air entry, normal vesicular breath sounds, no wheezes or crackles  Cardiovascular system: S1 & S2 heard, RRR. No JVD, murmurs, rubs, gallops or clicks. No pedal edema. Gastrointestinal system: Abdomen is nondistended, soft and nontender. No organomegaly or masses felt.  Bowel sounds are sluggish Central nervous system: Alert and oriented. No focal neurological deficits. Extremities: No edema, no clubbing ,no cyanosis, distal peripheral pulses palpable. Skin: No rashes, lesions or ulcers,no icterus ,no pallor MSK: Normal muscle bulk,tone ,power Psychiatry: Judgement and insight appear normal. Mood & affect appropriate.     Data Reviewed: I have personally reviewed following labs and imaging studies  CBC: Recent Labs  Lab 01/06/18 1610 01/07/18 0855  WBC 8.2 6.0  HGB 14.7 13.5  HCT 45.5 42.2  MCV 93.6 93.8  PLT 226 761   Basic Metabolic Panel: Recent Labs  Lab 01/06/18 1610 01/07/18 0855  NA 138 141  K 4.1 4.3  CL 101 102  CO2 23 29  GLUCOSE 147* 105*  BUN 14 16  CREATININE 1.09* 1.10*  CALCIUM 9.9 8.9   GFR: Estimated Creatinine Clearance: 26.6 mL/min (A) (by C-G formula based on SCr of 1.1 mg/dL (H)). Liver Function Tests: Recent Labs  Lab 01/06/18 1610 01/07/18 0855  AST 23 26  ALT 14 13  ALKPHOS 102 88  BILITOT 0.8 0.8  PROT 7.7 6.7    ALBUMIN 3.9 3.2*   Recent Labs  Lab 01/06/18 1610  LIPASE 56*   No results for input(s): AMMONIA in the last 168 hours. Coagulation Profile: No results for input(s): INR, PROTIME in the last 168 hours. Cardiac Enzymes: No results for input(s): CKTOTAL, CKMB, CKMBINDEX, TROPONINI in the last 168 hours. BNP (last 3 results) No results for input(s): PROBNP in the last 8760 hours. HbA1C: No results for input(s): HGBA1C in the last 72 hours. CBG: No results for input(s): GLUCAP in the last 168 hours. Lipid Profile: No results for input(s): CHOL, HDL, LDLCALC, TRIG, CHOLHDL, LDLDIRECT in the last 72 hours. Thyroid Function Tests: No results for input(s): TSH, T4TOTAL, FREET4, T3FREE, THYROIDAB in the last 72 hours. Anemia Panel: No results for input(s): VITAMINB12, FOLATE, FERRITIN, TIBC, IRON, RETICCTPCT in the last 72 hours. Sepsis Labs: No results for input(s): PROCALCITON, LATICACIDVEN in the last 168 hours.  Recent Results (from the past 240 hour(s))  Blood culture (routine x 2)     Status: None (Preliminary result)   Collection Time: 01/06/18  9:00 PM  Result Value Ref Range Status   Specimen Description BLOOD LEFT ANTECUBITAL  Final   Special Requests   Final    BOTTLES DRAWN AEROBIC AND ANAEROBIC Blood Culture adequate volume   Culture   Final    NO GROWTH < 24 HOURS Performed at [facility], [address], [place], [place] [address]    Report Status PENDING  Incomplete  Blood culture (routine x 2)     Status: None (Preliminary result)   Collection Time: 01/06/18  9:30 PM  Result Value Ref Range Status   Specimen Description BLOOD RIGHT WRIST  Final   Special Requests   Final    BOTTLES DRAWN AEROBIC AND ANAEROBIC Blood Culture results may not be optimal due to an inadequate volume of blood received in culture bottles   Culture   Final    NO GROWTH < 24 HOURS Performed at [facility], [place] [address] [place], [place] [address]    Report Status  PENDING  Incomplete         Radiology Studies: Dg Abd Portable 1v  Result Date: 01/06/2018 CLINICAL DATA:  Status post NG tube placement EXAM: PORTABLE ABDOMEN - 1 VIEW COMPARISON:  PET-CT 04/27/2017, CT 01/06/2018 FINDINGS: Patchy airspace disease in the left upper lobe and lung base. Esophageal tube tip overlies proximal stomach, side-port in the region of GE junction. Multiple loops of dilated small bowel visible in the upper abdomen. IMPRESSION: Esophageal tube tip overlies proximal stomach, side-port in the region of GE junction, suggest further advancement for more optimal positioning. Airspace disease in the left upper lobe. Multiple dilated loops of small bowel Electronically Signed   By: Donavan Foil M.D.   On: 01/06/2018 21:37   Ct Angio Chest/abd/pel For Dissection W And/or Wo Contrast  Result Date: 01/06/2018 CLINICAL DATA:  Severe abdominal pain radiating to lower back since last night. Nausea. History of lung cancer diagnosed in 2018, appendectomy, bladder surgery.  EXAM: CT ANGIOGRAPHY CHEST, ABDOMEN AND PELVIS TECHNIQUE: Multidetector CT imaging through the chest, abdomen and pelvis was performed using the standard protocol during bolus administration of intravenous contrast. Multiplanar reconstructed images and MIPs were obtained and reviewed to evaluate the vascular anatomy. CONTRAST:  62mL ISOVUE-370 IOPAMIDOL (ISOVUE-370) INJECTION 76% COMPARISON:  CT chest October 26, 2017 FINDINGS: CTA CHEST FINDINGS CARDIOVASCULAR: Thoracic aorta is normal course and caliber. Severe intimal thickening and calcific atherosclerosis. No intrinsic density on noncontrast CT. Homogeneous contrast opacification of thoracic aorta without dissection, aneurysm, luminal irregularity, periaortic fluid collections, or contrast extravasation. Heart size is normal. Moderate coronary artery calcification. Small pericardial effusion. MEDIASTINUM/NODES: No mediastinal mass or lymphadenopathy by CT size criteria.  LUNGS/PLEURA: Tracheobronchial tree is patent, no pneumothorax. Patchy LEFT upper and LEFT lower lobe consolidation with bilateral upper lobe centrilobular ground-glass and subsolid nodules measuring to 4 mm. Patchy ground-glass airspace opacities. Asymmetric LEFT apical pleural thickening, consolidation or mass. Punctate calcified granulomas. Mild centrilobular emphysema. MUSCULOSKELETAL: Non-suspicious. Osteopenia. Severe old T11 burst fracture. Subacute to old RIGHT eleventh rib fracture. Review of the MIP images confirms the above findings. CTA ABDOMEN AND PELVIS FINDINGS VASCULAR Aorta: Abdominal aorta is normal course and caliber. Moderate to severe calcific atherosclerosis. Homogeneous contrast opacification of aortoiliac vessels without dissection, aneurysm, luminal irregularity, periaortic fluid collections, or contrast extravasation. Celiac: Patent. SMA: Patent. Renals: Patent. IMA: Patent. Inflow: Negative. Veins: Negative, not tailored for evaluation. Review of the MIP images confirms the above findings. NON-VASCULAR HEPATOBILIARY: Status post cholecystectomy.  Normal liver. PANCREAS: Normal. SPLEEN: Normal. ADRENALS/URINARY TRACT: Kidneys are orthotopic, demonstrating symmetric enhancement. No nephrolithiasis, hydronephrosis or solid renal masses. The unopacified ureters are normal in course and caliber. Urinary bladder is partially distended and unremarkable. Nodular thickening associated with cortical hyperplasia. STOMACH/BOWEL: Small hiatal hernia. Moderate colonic diverticulosis. Dilated small bowel to 3.3 cm with air-fluid levels. At least 2 transition points (RIGHT mid abdomen, LEFT upper quadrant. VASCULAR/LYMPHATIC: No lymphadenopathy by CT size criteria. REPRODUCTIVE: Normal. OTHER: Small volume low-density ascites. MUSCULOSKELETAL: Nonacute. Streak artifact from RIGHT hip arthroplasty. Small fat containing umbilical hernia. Review of the MIP images confirms the above findings. IMPRESSION: CTA  CHEST: 1. No acute vascular process.  Severe atherosclerosis, unchanged. 2. 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 3. LEFT mass corresponding to known primary cancer. Similar scattered sub solid/ground-glass pulmonary nodules. CTA ABDOMEN AND PELVIS: 1. No acute vascular process. 2. Findings of small bowel obstruction, however given multiple transition points, this could reflect ileus. 3. Colonic diverticulosis without acute diverticulitis. Aortic Atherosclerosis (ICD10-I70.0). Emphysema (ICD10-J43.9). Electronically Signed   By: Elon Alas M.D.   On: 01/06/2018 18:47        Scheduled Meds: Continuous Infusions: . sodium chloride 75 mL/hr at 01/06/18 2123  . piperacillin-tazobactam (ZOSYN)  IV 3.375 g (01/07/18 1345)     LOS: 1 day    Time spent: 25 mins.More than 50% of that time was spent in counseling and/or coordination of care.      Shelly Coss, MD [facility] Pager [phone]  If 7PM-7AM, please contact night-coverage [email or web address] Password [email or web address] 01/07/2018, 2:22 PM

## 2018-01-07 NOTE — Progress Notes (Signed)
PT arrived to unit via bed. Pt have NG on low inter. Suction. Pt alert and oriented X 4 with no skin issue.

## 2018-01-07 NOTE — Consult Note (Signed)
Reason for Consult:SBO Referring Physician: Dr. Dessie Coma  [name] is an 84 y.o. female.  HPI: This is an 84 year old female with no medical problems who presented with a 1 day history of epigastric abdominal pain with nausea and vomiting that started yesterday at noon.  It was sudden in onset.  There were multiple episodes of emesis.  Pain was described as sharp.  Surgery has been asked to see the patient after CT scan with no oral contrast showed dilated bowel with a possible small bowel obstruction.  The patient has no previous history of bowel obstructions.  She reports she is been moving her bowels normally including yesterday morning.  She does have a history of having several abdominal procedures in the past.  Past Medical History:  Diagnosis Date  . Abdominal aortic atherosclerosis ([place])   . CAD (coronary artery disease)   . Cancer ([place])   . Carotid artery occlusion   . Common bile duct dilation   . Gallstones   . Headache(784.0)   . Hiatal hernia   . HLD (hyperlipidemia)   . Myocardial infarction ([place])   . PONV (postoperative nausea and vomiting)   . Stroke [facility]) 2003    Past Surgical History:  Procedure Laterality Date  . ABDOMINAL HYSTERECTOMY    . ANKLE FRACTURE SURGERY    . APPENDECTOMY    . BLADDER SURGERY    . CHOLECYSTECTOMY N/A 05/05/2013   Procedure: LAPAROSCOPIC CHOLECYSTECTOMY WITH INTRAOPERATIVE CHOLANGIOGRAM;  Surgeon: Ralene Ok, MD;  Location: [facility];  Service: General;  Laterality: N/A;  . HIP ARTHROPLASTY Right 01/10/2014   Procedure: RIGHT HIP HEMIARTHROPLASTY;  Surgeon: Johnn Hai, MD;  Location: [facility];  Service: Orthopedics;  Laterality: Right;  DEPUY AML LATERAL WITH MARK II  . ROTATOR CUFF REPAIR Left   . TONSILLECTOMY      Family History  Problem Relation Age of Onset  . Stroke Mother   . Breast cancer Maternal Aunt   . Prostate cancer Maternal Grandfather     Social History:  reports that she has been smoking cigarettes.  She has never used smokeless tobacco. She reports that she drinks alcohol. She reports that she does not use drugs.  Allergies:  Allergies  Allergen Reactions  . Dextroamphetamine Sulfate Er Other (See Comments)    Euphoric feeling    Medications: I have reviewed the patient's current medications.  Results for orders placed or performed during the hospital encounter of 01/06/18 (from the past 48 hour(s))  Lipase, blood     Status: Abnormal   Collection Time: 01/06/18  4:10 PM  Result Value Ref Range   Lipase 56 (H) 11 - 51 U/L    Comment: Performed at [facility], [address] [place], [place] [address]  Comprehensive metabolic panel     Status: Abnormal   Collection Time: 01/06/18  4:10 PM  Result Value Ref Range   Sodium 138 135 - 145 mmol/L   Potassium 4.1 3.5 - 5.1 mmol/L   Chloride 101 98 - 111 mmol/L   CO2 23 22 - 32 mmol/L   Glucose, Bld 147 (H) 70 - 99 mg/dL   BUN 14 8 - 23 mg/dL   Creatinine, Ser 1.09 (H) 0.44 - 1.00 mg/dL   Calcium 9.9 8.9 - 10.3 mg/dL   Total Protein 7.7 6.5 - 8.1 g/dL   Albumin 3.9 3.5 - 5.0 g/dL   AST 23 15 - 41 U/L   ALT 14 0 - 44 U/L  Alkaline Phosphatase 102 38 - 126 U/L   Total Bilirubin 0.8 0.3 - 1.2 mg/dL   GFR calc non Af Amer 44 (L) >60 mL/min   GFR calc Af Amer 51 (L) >60 mL/min    Comment: (NOTE) The eGFR has been calculated using the CKD EPI equation. This calculation has not been validated in all clinical situations. eGFR's persistently <60 mL/min signify possible Chronic Kidney Disease.    Anion gap 14 5 - 15    Comment: Performed at [facility] [address], [place] [address]  CBC     Status: None   Collection Time: 01/06/18  4:10 PM  Result Value Ref Range   WBC 8.2 4.0 - 10.5 K/uL   RBC 4.86 3.87 - 5.11 MIL/uL   Hemoglobin 14.7 12.0 - 15.0 g/dL   HCT 45.5 36.0 - 46.0 %   MCV 93.6 78.0 - 100.0 fL   MCH 30.2 26.0 - 34.0 pg   MCHC 32.3 30.0 - 36.0 g/dL   RDW 13.5 11.5 - 15.5 %   Platelets  226 150 - 400 K/uL    Comment: Performed at [facility] [address], [place], [place] [address]  I-stat troponin, ED     Status: None   Collection Time: 01/06/18  4:16 PM  Result Value Ref Range   Troponin i, poc 0.01 0.00 - 0.08 ng/mL   Comment 3            Comment: Due to the release kinetics of cTnI, a negative result within the first hours of the onset of symptoms does not rule out myocardial infarction with certainty. If myocardial infarction is still suspected, repeat the test at appropriate intervals.     Dg Abd Portable 1v  Result Date: 01/06/2018 CLINICAL DATA:  Status post NG tube placement EXAM: PORTABLE ABDOMEN - 1 VIEW COMPARISON:  PET-CT 04/27/2017, CT 01/06/2018 FINDINGS: Patchy airspace disease in the left upper lobe and lung base. Esophageal tube tip overlies proximal stomach, side-port in the region of GE junction. Multiple loops of dilated small bowel visible in the upper abdomen. IMPRESSION: Esophageal tube tip overlies proximal stomach, side-port in the region of GE junction, suggest further advancement for more optimal positioning. Airspace disease in the left upper lobe. Multiple dilated loops of small bowel Electronically Signed   By: Donavan Foil M.D.   On: 01/06/2018 21:37   Ct Angio Chest/abd/pel For Dissection W And/or Wo Contrast  Result Date: 01/06/2018 CLINICAL DATA:  Severe abdominal pain radiating to lower back since last night. Nausea. History of lung cancer diagnosed in 2018, appendectomy, bladder surgery. EXAM: CT ANGIOGRAPHY CHEST, ABDOMEN AND PELVIS TECHNIQUE: Multidetector CT imaging through the chest, abdomen and pelvis was performed using the standard protocol during bolus administration of intravenous contrast. Multiplanar reconstructed images and MIPs were obtained and reviewed to evaluate the vascular anatomy. CONTRAST:  79m ISOVUE-370 IOPAMIDOL (ISOVUE-370) INJECTION 76% COMPARISON:  CT chest October 26, 2017 FINDINGS: CTA CHEST FINDINGS  CARDIOVASCULAR: Thoracic aorta is normal course and caliber. Severe intimal thickening and calcific atherosclerosis. No intrinsic density on noncontrast CT. Homogeneous contrast opacification of thoracic aorta without dissection, aneurysm, luminal irregularity, periaortic fluid collections, or contrast extravasation. Heart size is normal. Moderate coronary artery calcification. Small pericardial effusion. MEDIASTINUM/NODES: No mediastinal mass or lymphadenopathy by CT size criteria. LUNGS/PLEURA: Tracheobronchial tree is patent, no pneumothorax. Patchy LEFT upper and LEFT lower lobe consolidation with bilateral upper lobe centrilobular ground-glass and subsolid nodules measuring to 4 mm.  Patchy ground-glass airspace opacities. Asymmetric LEFT apical pleural thickening, consolidation or mass. Punctate calcified granulomas. Mild centrilobular emphysema. MUSCULOSKELETAL: Non-suspicious. Osteopenia. Severe old T11 burst fracture. Subacute to old RIGHT eleventh rib fracture. Review of the MIP images confirms the above findings. CTA ABDOMEN AND PELVIS FINDINGS VASCULAR Aorta: Abdominal aorta is normal course and caliber. Moderate to severe calcific atherosclerosis. Homogeneous contrast opacification of aortoiliac vessels without dissection, aneurysm, luminal irregularity, periaortic fluid collections, or contrast extravasation. Celiac: Patent. SMA: Patent. Renals: Patent. IMA: Patent. Inflow: Negative. Veins: Negative, not tailored for evaluation. Review of the MIP images confirms the above findings. NON-VASCULAR HEPATOBILIARY: Status post cholecystectomy.  Normal liver. PANCREAS: Normal. SPLEEN: Normal. ADRENALS/URINARY TRACT: Kidneys are orthotopic, demonstrating symmetric enhancement. No nephrolithiasis, hydronephrosis or solid renal masses. The unopacified ureters are normal in course and caliber. Urinary bladder is partially distended and unremarkable. Nodular thickening associated with cortical hyperplasia.  STOMACH/BOWEL: Small hiatal hernia. Moderate colonic diverticulosis. Dilated small bowel to 3.3 cm with air-fluid levels. At least 2 transition points (RIGHT mid abdomen, LEFT upper quadrant. VASCULAR/LYMPHATIC: No lymphadenopathy by CT size criteria. REPRODUCTIVE: Normal. OTHER: Small volume low-density ascites. MUSCULOSKELETAL: Nonacute. Streak artifact from RIGHT hip arthroplasty. Small fat containing umbilical hernia. Review of the MIP images confirms the above findings. IMPRESSION: CTA CHEST: 1. No acute vascular process.  Severe atherosclerosis, unchanged. 2. 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 3. LEFT mass corresponding to known primary cancer. Similar scattered sub solid/ground-glass pulmonary nodules. CTA ABDOMEN AND PELVIS: 1. No acute vascular process. 2. Findings of small bowel obstruction, however given multiple transition points, this could reflect ileus. 3. Colonic diverticulosis without acute diverticulitis. Aortic Atherosclerosis (ICD10-I70.0). Emphysema (ICD10-J43.9). Electronically Signed   By: Elon Alas M.D.   On: 01/06/2018 18:47    Review of Systems  Constitutional: Negative for chills and fever.  Respiratory: Negative for cough and wheezing.   Cardiovascular: Negative for chest pain.  Gastrointestinal: Positive for abdominal pain, nausea and vomiting. Negative for constipation and diarrhea.  Genitourinary: Negative for dysuria and urgency.  Musculoskeletal: Negative for myalgias and neck pain.  All other systems reviewed and are negative.  Blood pressure (!) 141/88, pulse 94, temperature 98.6 F (37 C), temperature source Oral, resp. rate 16, height 5' 0.5" (1.537 m), weight 47.7 kg, SpO2 94 %. Physical Exam  Constitutional: She is oriented to person, place, and time. She appears well-developed and well-nourished. No distress.  HENT:  Head: Normocephalic and atraumatic.  Right Ear: External ear normal.  Left Ear: External ear  normal.  Nose: Nose normal.  Mouth/Throat: Oropharynx is clear and moist. No oropharyngeal exudate.  Eyes: Pupils are equal, round, and reactive to light. Right eye exhibits no discharge. Left eye exhibits no discharge. No scleral icterus.  Neck: Normal range of motion. Neck supple. No tracheal deviation present. No thyromegaly present.  Cardiovascular: Normal rate, regular rhythm, normal heart sounds and intact distal pulses.  No murmur heard. Respiratory: Effort normal. No respiratory distress. She has no wheezes.  Decreased breath sounds with occasional rale on the right  GI: Soft. She exhibits no distension. There is no tenderness. There is no guarding.  Musculoskeletal: Normal range of motion. She exhibits no edema, tenderness or deformity.  Lymphadenopathy:    She has no cervical adenopathy.  Neurological: She is alert and oriented to person, place, and time.  Skin: Skin is warm and dry. No rash noted. She is not diaphoretic. No erythema.  Psychiatric: Her behavior is normal. Judgment normal.    Assessment/Plan: Small  bowel obstruction versus ileus  The patient has currently been admitted to the medical service and a nasogastric tube is been placed.  She is now pain-free.  She has not passed flatus since the admission.  Again, I am uncertain whether this is an obstruction or not based on the CT scan.  This certainly may represent an ileus given her overall medical condition and possible pneumonia.  I have ordered the small bowel protocol to help determine whether there is an obstruction present.  I discussed this with her in detail and she is in agreement with the plan.  We will follow her closely with you.  Jeanmarie Mccowen A 01/07/2018, 6:35 AM

## 2018-01-08 ENCOUNTER — Inpatient Hospital Stay (HOSPITAL_COMMUNITY): Payer: Medicare Other

## 2018-01-08 DIAGNOSIS — E440 Moderate protein-calorie malnutrition: Secondary | ICD-10-CM

## 2018-01-08 LAB — BASIC METABOLIC PANEL
Anion gap: 11 (ref 5–15)
BUN: 16 mg/dL (ref 8–23)
CO2: 27 mmol/L (ref 22–32)
CREATININE: 1.17 mg/dL — AB (ref 0.44–1.00)
Calcium: 8.7 mg/dL — ABNORMAL LOW (ref 8.9–10.3)
Chloride: 102 mmol/L (ref 98–111)
GFR calc Af Amer: 47 mL/min — ABNORMAL LOW (ref >60)
GFR, EST NON AFRICAN AMERICAN: 41 mL/min — AB (ref >60)
Glucose, Bld: 72 mg/dL (ref 70–99)
Potassium: 2.9 mmol/L — ABNORMAL LOW (ref 3.5–5.1)
Sodium: 140 mmol/L (ref 135–145)

## 2018-01-08 MED ORDER — AMOXICILLIN-POT CLAVULANATE 875-125 MG PO TABS: 1.0000 | ORAL_TABLET | Freq: Two times a day (BID) | ORAL | 0 refills | Status: AC

## 2018-01-08 MED ORDER — POTASSIUM CHLORIDE CRYS ER 20 MEQ PO TBCR
40.0000 meq | EXTENDED_RELEASE_TABLET | Freq: Once | ORAL | Status: AC
Start: 2018-01-08 — End: 2018-01-08
  Administered 2018-01-08: 40 meq via ORAL
  Filled 2018-01-08: qty 2

## 2018-01-08 MED ORDER — POTASSIUM CHLORIDE ER 20 MEQ PO TBCR: 40.0000 meq | EXTENDED_RELEASE_TABLET | Freq: Every day | ORAL | 0 refills | Status: DC

## 2018-01-08 MED ORDER — POTASSIUM CHLORIDE 10 MEQ/100ML IV SOLN
10.0000 meq | INTRAVENOUS | Status: AC
  Administered 2018-01-08 (×5): 10 meq via INTRAVENOUS
  Filled 2018-01-08 (×5): qty 100

## 2018-01-08 NOTE — Progress Notes (Signed)
[facility]/Trauma Progress Note      Assessment/Plan Principal Problem:   SBO (small bowel obstruction) (HCC) Active Problems:   Hypertension   Malignant neoplasm of bronchus of left upper lobe (HCC)  SBO - contrast in colon seen on abd xray, having BM's  FEN: DC NGT, FLD and advance to soft as tolerated VTE: SCD's ID: Zosyn 08/18>> for possible aspiration PNA Follow up: None needed  DISPO: If pt tolerates soft diet she will be okay for discharge from our standpoint.     LOS: 2 days    Subjective: CC: SBO  No abdominal pain, no nausea. Having BM's. No issues overnight. No family at bedside.   Objective: Vital signs in last 24 hours: Temp:  [98.5 F (36.9 C)-99.1 F (37.3 C)] 98.7 F (37.1 C) (08/20 0500) Pulse Rate:  [74-80] 80 (08/20 0500) Resp:  [17-18] 17 (08/20 0500) BP: (151-176)/(63-71) 151/63 (08/20 0500) SpO2:  [93 %-94 %] 94 % (08/20 0500) Weight:  [47.9 kg] 47.9 kg (08/20 0652) Last BM Date: 01/07/18  Intake/Output from previous day: 08/19 0701 - 08/20 0700 In: 277.3 [NG/GT:200; IV Piggyback:77.3] Out: 800 [Emesis/NG output:800] Intake/Output this shift: Total I/O In: -  Out: 300 [Urine:300]  PE: Gen:  Alert, NAD, pleasant, cooperative Pulm:  Rate and effort normal Abd: Soft, NT/ND, +BS, no HSM Skin: warm and dry   Anti-infectives: Anti-infectives (From admission, onward)   Start     Dose/Rate Route Frequency Ordered Stop   01/07/18 0600  piperacillin-tazobactam (ZOSYN) IVPB 3.375 g     3.375 g 12.5 mL/hr over 240 Minutes Intravenous Every 8 hours 01/06/18 2149     01/06/18 2200  piperacillin-tazobactam (ZOSYN) IVPB 3.375 g     3.375 g 100 mL/hr over 30 Minutes Intravenous  Once 01/06/18 2131 01/07/18 0330   01/06/18 1945  cefTRIAXone (ROCEPHIN) 1 g in sodium chloride 0.9 % 100 mL IVPB  Status:  Discontinued     1 g 200 mL/hr over 30 Minutes Intravenous  Once 01/06/18 1931 01/06/18 2131      Lab Results:  Recent Labs     01/06/18 1610 01/07/18 0855  WBC 8.2 6.0  HGB 14.7 13.5  HCT 45.5 42.2  PLT 226 174   BMET Recent Labs    01/07/18 0855 01/08/18 0551  NA 141 140  K 4.3 2.9*  CL 102 102  CO2 29 27  GLUCOSE 105* 72  BUN 16 16  CREATININE 1.10* 1.17*  CALCIUM 8.9 8.7*   PT/INR No results for input(s): LABPROT, INR in the last 72 hours. CMP     Component Value Date/Time   NA 140 01/08/2018 0551   NA 135 (L) 05/21/2017 0829   K 2.9 (L) 01/08/2018 0551   K 3.6 05/21/2017 0829   CL 102 01/08/2018 0551   CO2 27 01/08/2018 0551   CO2 27 05/21/2017 0829   GLUCOSE 72 01/08/2018 0551   GLUCOSE 162 (H) 05/21/2017 0829   BUN 16 01/08/2018 0551   BUN 22.6 05/21/2017 0829   CREATININE 1.17 (H) 01/08/2018 0551   CREATININE 0.97 10/26/2017 0941   CREATININE 1.1 05/21/2017 0829   CALCIUM 8.7 (L) 01/08/2018 0551   CALCIUM 11.3 (H) 05/21/2017 0829   PROT 6.7 01/07/2018 0855   PROT 6.7 05/21/2017 0829   ALBUMIN 3.2 (L) 01/07/2018 0855   ALBUMIN 2.4 (L) 05/21/2017 0829   AST 26 01/07/2018 0855   AST 18 10/26/2017 0941   AST 14 05/21/2017 0829   ALT 13 01/07/2018 0855  ALT 9 10/26/2017 0941   ALT 15 05/21/2017 0829   ALKPHOS 88 01/07/2018 0855   ALKPHOS 143 05/21/2017 0829   BILITOT 0.8 01/07/2018 0855   BILITOT 0.4 10/26/2017 0941   BILITOT 0.38 05/21/2017 0829   GFRNONAA 41 (L) 01/08/2018 0551   GFRNONAA 51 (L) 10/26/2017 0941   GFRAA 47 (L) 01/08/2018 0551   GFRAA 60 (L) 10/26/2017 0941   Lipase     Component Value Date/Time   LIPASE 56 (H) 01/06/2018 1610    Studies/Results: Dg Abd Portable 1v  Result Date: 01/08/2018 CLINICAL DATA:  NG tube placement. EXAM: PORTABLE ABDOMEN - 1 VIEW COMPARISON:  01/07/2018. FINDINGS: NG tube noted with tip over superior lower stomach. No bowel distention. Oral contrast noted throughout the colon. No free air. Aortoiliac and visceral atherosclerotic vascular calcification. Degenerative change thoracolumbar spine left hip. Stable T11  compression fracture. Right hip replacement. IMPRESSION: NG tube has been advanced with its tip over the lower stomach on today's exam. Electronically Signed   By: Marcello Moores  Register   On: 01/08/2018 06:37   Dg Abd Portable 1v-small Bowel Obstruction Protocol-initial, 8 Hr Delay  Result Date: 01/07/2018 CLINICAL DATA:  8 hour small-bowel obstruction delayed view EXAM: PORTABLE ABDOMEN - 1 VIEW COMPARISON:  Eighteen 2019, CT 18 2019 FINDINGS: Esophageal tube tip projects over GE junction, side-port at the distal esophagus. Contrast is visualized within the colon and rectum. There remains mild gaseous enlargement of small bowel up to 3 cm. Right hip replacement. IMPRESSION: 1. Contrast material is visualized in the colon and rectum. 2. There remains mild small bowel dilatation 3. Esophageal tube tip overlies the GE junction, side-port in the region of the distal esophagus, further advancement suggested for more optimal positioning. Electronically Signed   By: Donavan Foil M.D.   On: 01/07/2018 22:24   Dg Abd Portable 1v  Result Date: 01/06/2018 CLINICAL DATA:  Status post NG tube placement EXAM: PORTABLE ABDOMEN - 1 VIEW COMPARISON:  PET-CT 04/27/2017, CT 01/06/2018 FINDINGS: Patchy airspace disease in the left upper lobe and lung base. Esophageal tube tip overlies proximal stomach, side-port in the region of GE junction. Multiple loops of dilated small bowel visible in the upper abdomen. IMPRESSION: Esophageal tube tip overlies proximal stomach, side-port in the region of GE junction, suggest further advancement for more optimal positioning. Airspace disease in the left upper lobe. Multiple dilated loops of small bowel Electronically Signed   By: Donavan Foil M.D.   On: 01/06/2018 21:37   Ct Angio Chest/abd/pel For Dissection W And/or Wo Contrast  Result Date: 01/06/2018 CLINICAL DATA:  Severe abdominal pain radiating to lower back since last night. Nausea. History of lung cancer diagnosed in 2018,  appendectomy, bladder surgery. EXAM: CT ANGIOGRAPHY CHEST, ABDOMEN AND PELVIS TECHNIQUE: Multidetector CT imaging through the chest, abdomen and pelvis was performed using the standard protocol during bolus administration of intravenous contrast. Multiplanar reconstructed images and MIPs were obtained and reviewed to evaluate the vascular anatomy. CONTRAST:  4mL ISOVUE-370 IOPAMIDOL (ISOVUE-370) INJECTION 76% COMPARISON:  CT chest October 26, 2017 FINDINGS: CTA CHEST FINDINGS CARDIOVASCULAR: Thoracic aorta is normal course and caliber. Severe intimal thickening and calcific atherosclerosis. No intrinsic density on noncontrast CT. Homogeneous contrast opacification of thoracic aorta without dissection, aneurysm, luminal irregularity, periaortic fluid collections, or contrast extravasation. Heart size is normal. Moderate coronary artery calcification. Small pericardial effusion. MEDIASTINUM/NODES: No mediastinal mass or lymphadenopathy by CT size criteria. LUNGS/PLEURA: Tracheobronchial tree is patent, no pneumothorax. Patchy LEFT upper and LEFT lower lobe  consolidation with bilateral upper lobe centrilobular ground-glass and subsolid nodules measuring to 4 mm. Patchy ground-glass airspace opacities. Asymmetric LEFT apical pleural thickening, consolidation or mass. Punctate calcified granulomas. Mild centrilobular emphysema. MUSCULOSKELETAL: Non-suspicious. Osteopenia. Severe old T11 burst fracture. Subacute to old RIGHT eleventh rib fracture. Review of the MIP images confirms the above findings. CTA ABDOMEN AND PELVIS FINDINGS VASCULAR Aorta: Abdominal aorta is normal course and caliber. Moderate to severe calcific atherosclerosis. Homogeneous contrast opacification of aortoiliac vessels without dissection, aneurysm, luminal irregularity, periaortic fluid collections, or contrast extravasation. Celiac: Patent. SMA: Patent. Renals: Patent. IMA: Patent. Inflow: Negative. Veins: Negative, not tailored for evaluation.  Review of the MIP images confirms the above findings. NON-VASCULAR HEPATOBILIARY: Status post cholecystectomy.  Normal liver. PANCREAS: Normal. SPLEEN: Normal. ADRENALS/URINARY TRACT: Kidneys are orthotopic, demonstrating symmetric enhancement. No nephrolithiasis, hydronephrosis or solid renal masses. The unopacified ureters are normal in course and caliber. Urinary bladder is partially distended and unremarkable. Nodular thickening associated with cortical hyperplasia. STOMACH/BOWEL: Small hiatal hernia. Moderate colonic diverticulosis. Dilated small bowel to 3.3 cm with air-fluid levels. At least 2 transition points (RIGHT mid abdomen, LEFT upper quadrant. VASCULAR/LYMPHATIC: No lymphadenopathy by CT size criteria. REPRODUCTIVE: Normal. OTHER: Small volume low-density ascites. MUSCULOSKELETAL: Nonacute. Streak artifact from RIGHT hip arthroplasty. Small fat containing umbilical hernia. Review of the MIP images confirms the above findings. IMPRESSION: CTA CHEST: 1. No acute vascular process.  Severe atherosclerosis, unchanged. 2. 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 3. LEFT mass corresponding to known primary cancer. Similar scattered sub solid/ground-glass pulmonary nodules. CTA ABDOMEN AND PELVIS: 1. No acute vascular process. 2. Findings of small bowel obstruction, however given multiple transition points, this could reflect ileus. 3. Colonic diverticulosis without acute diverticulitis. Aortic Atherosclerosis (ICD10-I70.0). Emphysema (ICD10-J43.9). Electronically Signed   By: Elon Alas M.D.   On: 01/06/2018 18:47      Kalman Drape , [facility] Surgery 01/08/2018, 8:40 AM  Pager: [phone] Mon-Wed, Friday 7:00am-4:30pm Thurs 7am-11:30am  Consults: [phone]

## 2018-01-08 NOTE — Discharge Summary (Addendum)
Physician Discharge Summary  [name] NID:[id number] DOB: 05-Mar-1929 DOA: 01/06/2018  PCP: Leonard Downing, MD  Admit date: 01/06/2018 Discharge date: 01/08/2018  Admitted From: Home Disposition:  Home  Discharge Condition:Stable CODE STATUS:FULL Diet recommendation: Heart Healthy  Brief/Interim Summary: Patient is 84 year old female with past medical history of non-small cell lung cancer followed by oncology, hypertension who was brought to the emergency department after she had persistent nausea and vomiting with abdominal discomfort.  Found to have distended abdomen on presentation.  CT abdomen/pelvis done in the emergency department showed concern for bowel obstruction.  Additionally it also showed multifocal pneumonia. Surgery consulted. She was managed conservatively.  Her bowel obstruction resolved.  She has been having bowel movements.  She tolerated a soft diet today.  Regarding her pneumonia, her respiratory status remained stable during the hospitalization.  She remained afebrile and hemodynamically stable.  She was saturating fine on room air and does not complain of any chest pain, shortness of breath or cough.  Antibiotics has been changed to oral today.  Patient will be discharged home today.  Following problems were addressed during her hospitalization:  SBO:  Treated conservatively.  SBO has resolved.  Patient having bowel movements. Abdomen pain has resolved.  Continue soft diet for next 3 to 5 days.  Pneumonia: CT showed possible multifocal pneumonia.  Likely aspiration pneumonia.  She was started on Zosyn.  Currently patient is having hemodynamically stable, afebrile.  Does not complain of any chest pain or shortness of breath or cough.  Antibiotics changed to Augmentin  Hypertension:   Currently normotensive.  Not on medications at home.  History of non-small cell lung cancer of left lung: Follows Dr. Julien Nordmann.  Status post chemotherapy.  She will follow-up  with oncology as an outpatient.  Hypokalemia: Admitted with potassium.  Continue potassium supplements at home.  Check potassium level in a week.  Discharge Diagnoses:  Principal Problem:   SBO (small bowel obstruction) (HCC) Active Problems:   Hypertension   Malignant neoplasm of bronchus of left upper lobe (HCC)   Malnutrition of moderate degree    Discharge Instructions  Discharge Instructions    Diet - low sodium heart healthy   Complete by:  As directed    Take soft diet for next 3-5 days   Discharge instructions   Complete by:  As directed    1)Follow up with your PCP in a week.  Do a CBC, BMP test during the follow-up. 2)Take prescribed medications as instructed.   Increase activity slowly   Complete by:  As directed      Allergies as of 01/08/2018      Reactions   Dextroamphetamine Sulfate Er Other (See Comments)   Euphoric feeling      Medication List    TAKE these medications   ALPRAZolam 0.25 MG tablet Commonly known as:  XANAX Take 0.5 tablets (0.125 mg total) by mouth 2 (two) times daily as needed for anxiety.   amoxicillin-clavulanate 875-125 MG tablet Commonly known as:  AUGMENTIN Take 1 tablet by mouth 2 (two) times daily for 5 days. Start taking on:  01/09/2018   aspirin 81 MG EC tablet Take 81 mg by mouth daily.   busPIRone 10 MG tablet Commonly known as:  BUSPAR Take 10 mg by mouth daily as needed (anxiety).   feeding supplement (ENSURE ENLIVE) Liqd Take 237 mLs 2 (two) times daily between meals by mouth.   ibuprofen 200 MG tablet Commonly known as:  ADVIL,MOTRIN Take 400 mg  by mouth every 6 (six) hours as needed for headache.   polyethylene glycol packet Commonly known as:  MIRALAX / GLYCOLAX Take 17 g daily as needed by mouth. What changed:    reasons to take this  additional instructions   Potassium Chloride ER 20 MEQ Tbcr Take 40 mEq by mouth daily for 5 days. Start taking on:  01/09/2018   prochlorperazine 10 MG  tablet Commonly known as:  COMPAZINE Take 1 tablet (10 mg total) by mouth every 6 (six) hours as needed for nausea or vomiting.   protein supplement shake Liqd Commonly known as:  PREMIER PROTEIN Take 2 oz by mouth daily with breakfast.      Follow-up Information    Leonard Downing, MD. Schedule an appointment as soon as possible for a visit in 1 week(s).   Specialty:  Family Medicine Contact information: [place] [address] [phone]          Allergies  Allergen Reactions  . Dextroamphetamine Sulfate Er Other (See Comments)    Euphoric feeling    Consultations: General surgery   Procedures/Studies: Dg Abd Portable 1v  Result Date: 01/08/2018 CLINICAL DATA:  NG tube placement. EXAM: PORTABLE ABDOMEN - 1 VIEW COMPARISON:  01/07/2018. FINDINGS: NG tube noted with tip over superior lower stomach. No bowel distention. Oral contrast noted throughout the colon. No free air. Aortoiliac and visceral atherosclerotic vascular calcification. Degenerative change thoracolumbar spine left hip. Stable T11 compression fracture. Right hip replacement. IMPRESSION: NG tube has been advanced with its tip over the lower stomach on today's exam. Electronically Signed   By: Marcello Moores  Register   On: 01/08/2018 06:37   Dg Abd Portable 1v-small Bowel Obstruction Protocol-initial, 8 Hr Delay  Result Date: 01/07/2018 CLINICAL DATA:  8 hour small-bowel obstruction delayed view EXAM: PORTABLE ABDOMEN - 1 VIEW COMPARISON:  Eighteen 2019, CT 18 2019 FINDINGS: Esophageal tube tip projects over GE junction, side-port at the distal esophagus. Contrast is visualized within the colon and rectum. There remains mild gaseous enlargement of small bowel up to 3 cm. Right hip replacement. IMPRESSION: 1. Contrast material is visualized in the colon and rectum. 2. There remains mild small bowel dilatation 3. Esophageal tube tip overlies the GE junction, side-port in the region of the distal  esophagus, further advancement suggested for more optimal positioning. Electronically Signed   By: Donavan Foil M.D.   On: 01/07/2018 22:24   Dg Abd Portable 1v  Result Date: 01/06/2018 CLINICAL DATA:  Status post NG tube placement EXAM: PORTABLE ABDOMEN - 1 VIEW COMPARISON:  PET-CT 04/27/2017, CT 01/06/2018 FINDINGS: Patchy airspace disease in the left upper lobe and lung base. Esophageal tube tip overlies proximal stomach, side-port in the region of GE junction. Multiple loops of dilated small bowel visible in the upper abdomen. IMPRESSION: Esophageal tube tip overlies proximal stomach, side-port in the region of GE junction, suggest further advancement for more optimal positioning. Airspace disease in the left upper lobe. Multiple dilated loops of small bowel Electronically Signed   By: Donavan Foil M.D.   On: 01/06/2018 21:37   Ct Angio Chest/abd/pel For Dissection W And/or Wo Contrast  Result Date: 01/06/2018 CLINICAL DATA:  Severe abdominal pain radiating to lower back since last night. Nausea. History of lung cancer diagnosed in 2018, appendectomy, bladder surgery. EXAM: CT ANGIOGRAPHY CHEST, ABDOMEN AND PELVIS TECHNIQUE: Multidetector CT imaging through the chest, abdomen and pelvis was performed using the standard protocol during bolus administration of intravenous contrast. Multiplanar reconstructed images  and MIPs were obtained and reviewed to evaluate the vascular anatomy. CONTRAST:  88mL ISOVUE-370 IOPAMIDOL (ISOVUE-370) INJECTION 76% COMPARISON:  CT chest October 26, 2017 FINDINGS: CTA CHEST FINDINGS CARDIOVASCULAR: Thoracic aorta is normal course and caliber. Severe intimal thickening and calcific atherosclerosis. No intrinsic density on noncontrast CT. Homogeneous contrast opacification of thoracic aorta without dissection, aneurysm, luminal irregularity, periaortic fluid collections, or contrast extravasation. Heart size is normal. Moderate coronary artery calcification. Small pericardial  effusion. MEDIASTINUM/NODES: No mediastinal mass or lymphadenopathy by CT size criteria. LUNGS/PLEURA: Tracheobronchial tree is patent, no pneumothorax. Patchy LEFT upper and LEFT lower lobe consolidation with bilateral upper lobe centrilobular ground-glass and subsolid nodules measuring to 4 mm. Patchy ground-glass airspace opacities. Asymmetric LEFT apical pleural thickening, consolidation or mass. Punctate calcified granulomas. Mild centrilobular emphysema. MUSCULOSKELETAL: Non-suspicious. Osteopenia. Severe old T11 burst fracture. Subacute to old RIGHT eleventh rib fracture. Review of the MIP images confirms the above findings. CTA ABDOMEN AND PELVIS FINDINGS VASCULAR Aorta: Abdominal aorta is normal course and caliber. Moderate to severe calcific atherosclerosis. Homogeneous contrast opacification of aortoiliac vessels without dissection, aneurysm, luminal irregularity, periaortic fluid collections, or contrast extravasation. Celiac: Patent. SMA: Patent. Renals: Patent. IMA: Patent. Inflow: Negative. Veins: Negative, not tailored for evaluation. Review of the MIP images confirms the above findings. NON-VASCULAR HEPATOBILIARY: Status post cholecystectomy.  Normal liver. PANCREAS: Normal. SPLEEN: Normal. ADRENALS/URINARY TRACT: Kidneys are orthotopic, demonstrating symmetric enhancement. No nephrolithiasis, hydronephrosis or solid renal masses. The unopacified ureters are normal in course and caliber. Urinary bladder is partially distended and unremarkable. Nodular thickening associated with cortical hyperplasia. STOMACH/BOWEL: Small hiatal hernia. Moderate colonic diverticulosis. Dilated small bowel to 3.3 cm with air-fluid levels. At least 2 transition points (RIGHT mid abdomen, LEFT upper quadrant. VASCULAR/LYMPHATIC: No lymphadenopathy by CT size criteria. REPRODUCTIVE: Normal. OTHER: Small volume low-density ascites. MUSCULOSKELETAL: Nonacute. Streak artifact from RIGHT hip arthroplasty. Small fat containing  umbilical hernia. Review of the MIP images confirms the above findings. IMPRESSION: CTA CHEST: 1. No acute vascular process.  Severe atherosclerosis, unchanged. 2. 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 3. LEFT mass corresponding to known primary cancer. Similar scattered sub solid/ground-glass pulmonary nodules. CTA ABDOMEN AND PELVIS: 1. No acute vascular process. 2. Findings of small bowel obstruction, however given multiple transition points, this could reflect ileus. 3. Colonic diverticulosis without acute diverticulitis. Aortic Atherosclerosis (ICD10-I70.0). Emphysema (ICD10-J43.9). Electronically Signed   By: Elon Alas M.D.   On: 01/06/2018 18:47       Subjective: Patient seen and examined at bedside this morning.  Remains comfortable.  Just had a bowel movement.  Tolerating soft diet.  Discharge Exam: Vitals:   01/08/18 0500 01/08/18 1436  BP: (!) 151/63 103/61  Pulse: 80 67  Resp: 17 17  Temp: 98.7 F (37.1 C) 98.3 F (36.8 C)  SpO2: 94% 97%   Vitals:   01/07/18 2136 01/08/18 0500 01/08/18 0652 01/08/18 1436  BP: (!) 176/66 (!) 151/63  103/61  Pulse: 74 80  67  Resp: 18 17  17   Temp: 98.5 F (36.9 C) 98.7 F (37.1 C)  98.3 F (36.8 C)  TempSrc: Oral Oral  Oral  SpO2: 94% 94%  97%  Weight:   47.9 kg   Height:        General: Pt is alert, awake, not in acute distress Cardiovascular: RRR, S1/S2 +, no rubs, no gallops Respiratory: CTA bilaterally, no wheezing, no rhonchi Abdominal: Soft, NT, ND, bowel sounds + Extremities: no edema, no cyanosis    The results of  significant diagnostics from this hospitalization (including imaging, microbiology, ancillary and laboratory) are listed below for reference.     Microbiology: Recent Results (from the past 240 hour(s))  Blood culture (routine x 2)     Status: None (Preliminary result)   Collection Time: 01/06/18  9:00 PM  Result Value Ref Range Status   Specimen  Description BLOOD LEFT ANTECUBITAL  Final   Special Requests   Final    BOTTLES DRAWN AEROBIC AND ANAEROBIC Blood Culture adequate volume   Culture   Final    NO GROWTH < 24 HOURS Performed at [facility], [address] [place], [place] [address]    Report Status PENDING  Incomplete  Blood culture (routine x 2)     Status: None (Preliminary result)   Collection Time: 01/06/18  9:30 PM  Result Value Ref Range Status   Specimen Description BLOOD RIGHT WRIST  Final   Special Requests   Final    BOTTLES DRAWN AEROBIC AND ANAEROBIC Blood Culture results may not be optimal due to an inadequate volume of blood received in culture bottles   Culture   Final    NO GROWTH < 24 HOURS Performed at [facility], [place] [address], [place], [place] [address]    Report Status PENDING  Incomplete     Labs: BNP (last 3 results) No results for input(s): BNP in the last 8760 hours. Basic Metabolic Panel: Recent Labs  Lab 01/06/18 1610 01/07/18 0855 01/08/18 0551  NA 138 141 140  K 4.1 4.3 2.9*  CL 101 102 102  CO2 23 29 27   GLUCOSE 147* 105* 72  BUN 14 16 16   CREATININE 1.09* 1.10* 1.17*  CALCIUM 9.9 8.9 8.7*   Liver Function Tests: Recent Labs  Lab 01/06/18 1610 01/07/18 0855  AST 23 26  ALT 14 13  ALKPHOS 102 88  BILITOT 0.8 0.8  PROT 7.7 6.7  ALBUMIN 3.9 3.2*   Recent Labs  Lab 01/06/18 1610  LIPASE 56*   No results for input(s): AMMONIA in the last 168 hours. CBC: Recent Labs  Lab 01/06/18 1610 01/07/18 0855  WBC 8.2 6.0  HGB 14.7 13.5  HCT 45.5 42.2  MCV 93.6 93.8  PLT 226 174   Cardiac Enzymes: No results for input(s): CKTOTAL, CKMB, CKMBINDEX, TROPONINI in the last 168 hours. BNP: Invalid input(s): POCBNP CBG: No results for input(s): GLUCAP in the last 168 hours. D-Dimer No results for input(s): DDIMER in the last 72 hours. Hgb A1c No results for input(s): HGBA1C in the last 72 hours. Lipid Profile No results for input(s): CHOL, HDL,  LDLCALC, TRIG, CHOLHDL, LDLDIRECT in the last 72 hours. Thyroid function studies No results for input(s): TSH, T4TOTAL, T3FREE, THYROIDAB in the last 72 hours.  Invalid input(s): FREET3 Anemia work up No results for input(s): VITAMINB12, FOLATE, FERRITIN, TIBC, IRON, RETICCTPCT in the last 72 hours. Urinalysis    Component Value Date/Time   COLORURINE YELLOW 05/14/2017 0402   APPEARANCEUR HAZY (A) 05/14/2017 0402   LABSPEC 1.009 05/14/2017 0402   PHURINE 7.0 05/14/2017 0402   GLUCOSEU NEGATIVE 05/14/2017 0402   HGBUR NEGATIVE 05/14/2017 0402   BILIRUBINUR NEGATIVE 05/14/2017 0402   KETONESUR NEGATIVE 05/14/2017 0402   PROTEINUR NEGATIVE 05/14/2017 0402   UROBILINOGEN 0.2 01/09/2014 1448   NITRITE POSITIVE (A) 05/14/2017 0402   LEUKOCYTESUR NEGATIVE 05/14/2017 0402   Sepsis Labs Invalid input(s): PROCALCITONIN,  WBC,  LACTICIDVEN Microbiology Recent Results (from the past 240 hour(s))  Blood culture (  routine x 2)     Status: None (Preliminary result)   Collection Time: 01/06/18  9:00 PM  Result Value Ref Range Status   Specimen Description BLOOD LEFT ANTECUBITAL  Final   Special Requests   Final    BOTTLES DRAWN AEROBIC AND ANAEROBIC Blood Culture adequate volume   Culture   Final    NO GROWTH < 24 HOURS Performed at [facility], [address], [place], [place] [address]    Report Status PENDING  Incomplete  Blood culture (routine x 2)     Status: None (Preliminary result)   Collection Time: 01/06/18  9:30 PM  Result Value Ref Range Status   Specimen Description BLOOD RIGHT WRIST  Final   Special Requests   Final    BOTTLES DRAWN AEROBIC AND ANAEROBIC Blood Culture results may not be optimal due to an inadequate volume of blood received in culture bottles   Culture   Final    NO GROWTH < 24 HOURS Performed at [facility], [place] [address] [place], [place] [address]    Report Status PENDING  Incomplete    Please note: You were cared for by a hospitalist  during your hospital stay. Once you are discharged, your primary care physician will handle any further medical issues. Please note that NO REFILLS for any discharge medications will be authorized once you are discharged, as it is imperative that you return to your primary care physician (or establish a relationship with a primary care physician if you do not have one) for your post hospital discharge needs so that they can reassess your need for medications and monitor your lab values.    Time coordinating discharge: 40 minutes  SIGNED:   Shelly Coss, MD  [facility] 01/08/2018, 2:40 PM Pager [phone]  If 7PM-7AM, please contact night-coverage [email or web address] Password [email or web address]

## 2018-01-08 NOTE — Progress Notes (Signed)
Nsg Discharge Note  Admit Date:  01/06/2018 Discharge date: 01/08/2018   ARIELLA VOIT to be D/C'd Home per MD order.  AVS completed.  Copy for chart, and copy for patient signed, and dated. Patient/caregiver able to verbalize understanding.  Discharge Medication: Allergies as of 01/08/2018      Reactions   Dextroamphetamine Sulfate Er Other (See Comments)   Euphoric feeling      Medication List    TAKE these medications   ALPRAZolam 0.25 MG tablet Commonly known as:  XANAX Take 0.5 tablets (0.125 mg total) by mouth 2 (two) times daily as needed for anxiety.   amoxicillin-clavulanate 875-125 MG tablet Commonly known as:  AUGMENTIN Take 1 tablet by mouth 2 (two) times daily for 5 days. Start taking on:  01/09/2018   aspirin 81 MG EC tablet Take 81 mg by mouth daily.   busPIRone 10 MG tablet Commonly known as:  BUSPAR Take 10 mg by mouth daily as needed (anxiety).   feeding supplement (ENSURE ENLIVE) Liqd Take 237 mLs 2 (two) times daily between meals by mouth.   ibuprofen 200 MG tablet Commonly known as:  ADVIL,MOTRIN Take 400 mg by mouth every 6 (six) hours as needed for headache.   polyethylene glycol packet Commonly known as:  MIRALAX / GLYCOLAX Take 17 g daily as needed by mouth. What changed:    reasons to take this  additional instructions   Potassium Chloride ER 20 MEQ Tbcr Take 40 mEq by mouth daily for 5 days. Start taking on:  01/09/2018   prochlorperazine 10 MG tablet Commonly known as:  COMPAZINE Take 1 tablet (10 mg total) by mouth every 6 (six) hours as needed for nausea or vomiting.   protein supplement shake Liqd Commonly known as:  PREMIER PROTEIN Take 2 oz by mouth daily with breakfast.       Discharge Assessment: Vitals:   01/08/18 0500 01/08/18 1436  BP: (!) 151/63 103/61  Pulse: 80 67  Resp: 17 17  Temp: 98.7 F (37.1 C) 98.3 F (36.8 C)  SpO2: 94% 97%   Skin clean, dry and intact without evidence of skin break down, no  evidence of skin tears noted. IV catheter discontinued intact. Site without signs and symptoms of complications - no redness or edema noted at insertion site, patient denies c/o pain - only slight tenderness at site.  Dressing with slight pressure applied.  D/c Instructions-Education: Discharge instructions given to patient/family with verbalized understanding. D/c education completed with patient/family including follow up instructions, medication list, d/c activities limitations if indicated, with other d/c instructions as indicated by MD - patient able to verbalize understanding, all questions fully answered. Patient instructed to return to ED, call 911, or call MD for any changes in condition.  Patient escorted via [place], and D/C home via private auto.  Baley Shands Margaretha [place], RN 01/08/2018 6:20 PM

## 2018-01-11 LAB — CULTURE, BLOOD (ROUTINE X 2)
CULTURE: NO GROWTH
Culture: NO GROWTH
Special Requests: ADEQUATE

## 2018-01-15 ENCOUNTER — Telehealth: Payer: Self-pay | Admitting: Internal Medicine

## 2018-01-15 NOTE — Telephone Encounter (Signed)
R/s pt lab appt per provider request.

## 2018-01-25 ENCOUNTER — Ambulatory Visit (HOSPITAL_COMMUNITY)
Admission: RE | Admit: 2018-01-25 | Discharge: 2018-01-25 | Disposition: A | Discharge status: Home / Self Care | Payer: Medicare Other | Source: Ambulatory Visit | Attending: Internal Medicine | Admitting: Internal Medicine

## 2018-01-25 ENCOUNTER — Other Ambulatory Visit: Payer: Self-pay

## 2018-01-25 ENCOUNTER — Inpatient Hospital Stay: Payer: Medicare Other | Attending: Internal Medicine

## 2018-01-25 DIAGNOSIS — Z79899 Other long term (current) drug therapy: Secondary | ICD-10-CM | POA: Insufficient documentation

## 2018-01-25 DIAGNOSIS — Z9049 Acquired absence of other specified parts of digestive tract: Secondary | ICD-10-CM | POA: Diagnosis not present

## 2018-01-25 DIAGNOSIS — I513 Intracardiac thrombosis, not elsewhere classified: Secondary | ICD-10-CM | POA: Diagnosis not present

## 2018-01-25 DIAGNOSIS — E785 Hyperlipidemia, unspecified: Secondary | ICD-10-CM | POA: Diagnosis not present

## 2018-01-25 DIAGNOSIS — C3490 Malignant neoplasm of unspecified part of unspecified bronchus or lung: Secondary | ICD-10-CM

## 2018-01-25 DIAGNOSIS — I700 Atherosclerosis of aorta: Secondary | ICD-10-CM | POA: Insufficient documentation

## 2018-01-25 DIAGNOSIS — J438 Other emphysema: Secondary | ICD-10-CM | POA: Insufficient documentation

## 2018-01-25 DIAGNOSIS — K5730 Diverticulosis of large intestine without perforation or abscess without bleeding: Secondary | ICD-10-CM | POA: Diagnosis not present

## 2018-01-25 DIAGNOSIS — J432 Centrilobular emphysema: Secondary | ICD-10-CM | POA: Diagnosis not present

## 2018-01-25 DIAGNOSIS — M8580 Other specified disorders of bone density and structure, unspecified site: Secondary | ICD-10-CM | POA: Diagnosis not present

## 2018-01-25 DIAGNOSIS — I2510 Atherosclerotic heart disease of native coronary artery without angina pectoris: Secondary | ICD-10-CM | POA: Diagnosis not present

## 2018-01-25 DIAGNOSIS — R1310 Dysphagia, unspecified: Secondary | ICD-10-CM | POA: Insufficient documentation

## 2018-01-25 DIAGNOSIS — K429 Umbilical hernia without obstruction or gangrene: Secondary | ICD-10-CM | POA: Diagnosis not present

## 2018-01-25 DIAGNOSIS — I313 Pericardial effusion (noninflammatory): Secondary | ICD-10-CM | POA: Diagnosis not present

## 2018-01-25 DIAGNOSIS — C3412 Malignant neoplasm of upper lobe, left bronchus or lung: Secondary | ICD-10-CM | POA: Insufficient documentation

## 2018-01-25 DIAGNOSIS — E279 Disorder of adrenal gland, unspecified: Secondary | ICD-10-CM | POA: Diagnosis not present

## 2018-01-25 DIAGNOSIS — Z9221 Personal history of antineoplastic chemotherapy: Secondary | ICD-10-CM | POA: Insufficient documentation

## 2018-01-25 DIAGNOSIS — K56609 Unspecified intestinal obstruction, unspecified as to partial versus complete obstruction: Secondary | ICD-10-CM | POA: Diagnosis not present

## 2018-01-25 DIAGNOSIS — Z8673 Personal history of transient ischemic attack (TIA), and cerebral infarction without residual deficits: Secondary | ICD-10-CM | POA: Diagnosis not present

## 2018-01-25 DIAGNOSIS — K208 Other esophagitis: Secondary | ICD-10-CM | POA: Diagnosis not present

## 2018-01-25 DIAGNOSIS — I252 Old myocardial infarction: Secondary | ICD-10-CM | POA: Diagnosis not present

## 2018-01-25 DIAGNOSIS — Z923 Personal history of irradiation: Secondary | ICD-10-CM | POA: Insufficient documentation

## 2018-01-25 DIAGNOSIS — Z7982 Long term (current) use of aspirin: Secondary | ICD-10-CM | POA: Diagnosis not present

## 2018-01-25 DIAGNOSIS — K449 Diaphragmatic hernia without obstruction or gangrene: Secondary | ICD-10-CM | POA: Insufficient documentation

## 2018-01-25 DIAGNOSIS — J439 Emphysema, unspecified: Secondary | ICD-10-CM | POA: Insufficient documentation

## 2018-01-25 LAB — CMP (CANCER CENTER ONLY)
ALT: 7 U/L (ref 0–44)
AST: 15 U/L (ref 15–41)
Albumin: 3.5 g/dL (ref 3.5–5.0)
Alkaline Phosphatase: 103 U/L (ref 38–126)
Anion gap: 7 (ref 5–15)
BILIRUBIN TOTAL: 0.3 mg/dL (ref 0.3–1.2)
BUN: 15 mg/dL (ref 8–23)
CO2: 26 mmol/L (ref 22–32)
Calcium: 9.6 mg/dL (ref 8.9–10.3)
Chloride: 104 mmol/L (ref 98–111)
Creatinine: 0.91 mg/dL (ref 0.44–1.00)
GFR, EST NON AFRICAN AMERICAN: 55 mL/min — AB (ref >60)
Glucose, Bld: 108 mg/dL — ABNORMAL HIGH (ref 70–99)
Potassium: 4.3 mmol/L (ref 3.5–5.1)
Sodium: 137 mmol/L (ref 135–145)
TOTAL PROTEIN: 7.2 g/dL (ref 6.5–8.1)

## 2018-01-25 LAB — CBC WITH DIFFERENTIAL (CANCER CENTER ONLY)
Basophils Absolute: 0 10*3/uL (ref 0.0–0.1)
Basophils Relative: 0 %
EOS PCT: 2 %
Eosinophils Absolute: 0.1 10*3/uL (ref 0.0–0.5)
HEMATOCRIT: 38.2 % (ref 34.8–46.6)
Hemoglobin: 12.7 g/dL (ref 11.6–15.9)
LYMPHS ABS: 0.9 10*3/uL (ref 0.9–3.3)
LYMPHS PCT: 17 %
MCH: 30.5 pg (ref 25.1–34.0)
MCHC: 33.3 g/dL (ref 31.5–36.0)
MCV: 91.6 fL (ref 79.5–101.0)
MONO ABS: 0.4 10*3/uL (ref 0.1–0.9)
Monocytes Relative: 8 %
NEUTROS ABS: 3.8 10*3/uL (ref 1.5–6.5)
Neutrophils Relative %: 73 %
Platelet Count: 190 10*3/uL (ref 145–400)
RBC: 4.17 MIL/uL (ref 3.70–5.45)
RDW: 14.2 % (ref 11.2–14.5)
WBC Count: 5.1 10*3/uL (ref 3.9–10.3)

## 2018-01-25 MED ORDER — IOHEXOL 300 MG/ML  SOLN
75.0000 mL | Freq: Once | INTRAMUSCULAR | Status: AC | PRN
  Administered 2018-01-25: 75 mL via INTRAVENOUS

## 2018-01-28 ENCOUNTER — Encounter: Payer: Self-pay | Admitting: Internal Medicine

## 2018-01-28 ENCOUNTER — Inpatient Hospital Stay (HOSPITAL_BASED_OUTPATIENT_CLINIC_OR_DEPARTMENT_OTHER): Payer: Medicare Other | Admitting: Internal Medicine

## 2018-01-28 ENCOUNTER — Telehealth: Payer: Self-pay | Admitting: Internal Medicine

## 2018-01-28 VITALS — BP 167/74 | HR 71 | Temp 98.0°F | Resp 18 | Ht 60.5 in | Wt 103.5 lb

## 2018-01-28 DIAGNOSIS — K208 Other esophagitis: Secondary | ICD-10-CM

## 2018-01-28 DIAGNOSIS — R1310 Dysphagia, unspecified: Secondary | ICD-10-CM

## 2018-01-28 DIAGNOSIS — C3412 Malignant neoplasm of upper lobe, left bronchus or lung: Secondary | ICD-10-CM

## 2018-01-28 DIAGNOSIS — Z923 Personal history of irradiation: Secondary | ICD-10-CM

## 2018-01-28 DIAGNOSIS — R531 Weakness: Secondary | ICD-10-CM

## 2018-01-28 DIAGNOSIS — Z9221 Personal history of antineoplastic chemotherapy: Secondary | ICD-10-CM | POA: Diagnosis not present

## 2018-01-28 DIAGNOSIS — Z79899 Other long term (current) drug therapy: Secondary | ICD-10-CM

## 2018-01-28 DIAGNOSIS — C3490 Malignant neoplasm of unspecified part of unspecified bronchus or lung: Secondary | ICD-10-CM

## 2018-01-28 NOTE — Progress Notes (Signed)
[place] [phone]   [phone]  OFFICE PROGRESS NOTE  Leonard Downing, MD [place] [address]  DIAGNOSIS: Stage IIIA (T2b, N2, M0) non-small cell lung cancer, squamous cell carcinoma presented with left upper lobe lung mass left hilar and mediastinal lymphadenopathy diagnosed in November 2018. PDL 1 less than 1%.  PRIOR THERAPY: Concurrent chemoradiation with weekly carboplatin for AUC of 2 and paclitaxel 45 mg/M2.  First dose May 21, 2017, status post 5 cycles.  Last dose was given on June 18, 2017.  CURRENT THERAPY: Observation.  INTERVAL HISTORY: [name] 84 y.o. female returns to the clinic today for 64-month follow-up visit accompanied by her daughter.  The patient is feeling fine except for fatigue and intermittent dizzy spells.  She denied having any chest pain but has shortness of breath with exertion with no cough or hemoptysis.  She denied having any fever or chills.  She has no nausea, vomiting, diarrhea or constipation.  She is current on observation.  She had repeat CT scan of the chest performed recently and she is here for evaluation and discussion of her risk her results.  MEDICAL HISTORY: Past Medical History:  Diagnosis Date  . Abdominal aortic atherosclerosis ([place])   . CAD (coronary artery disease)   . Cancer ([place])   . Carotid artery occlusion   . Common bile duct dilation   . Gallstones   . Headache(784.0)   . Hiatal hernia   . HLD (hyperlipidemia)   . Myocardial infarction ([place])   . PONV (postoperative nausea and vomiting)   . Stroke [facility]) 2003    ALLERGIES:  is allergic to dextroamphetamine sulfate er.  MEDICATIONS:  Current Outpatient Medications  Medication Sig Dispense Refill  . ALPRAZolam (XANAX) 0.25 MG tablet Take 0.5 tablets (0.125 mg total) by mouth 2 (two) times daily as needed for anxiety. (Patient not taking: Reported on 01/06/2018) 10 tablet 0  .  aspirin 81 MG EC tablet Take 81 mg by mouth daily.      . busPIRone (BUSPAR) 10 MG tablet Take 10 mg by mouth daily as needed (anxiety).     . feeding supplement, ENSURE ENLIVE, (ENSURE ENLIVE) LIQD Take 237 mLs 2 (two) times daily between meals by mouth. (Patient not taking: Reported on 01/06/2018) 30 Bottle 0  . ibuprofen (ADVIL,MOTRIN) 200 MG tablet Take 400 mg by mouth every 6 (six) hours as needed for headache.    . polyethylene glycol (MIRALAX / GLYCOLAX) packet Take 17 g daily as needed by mouth. (Patient taking differently: Take 17 g by mouth daily as needed for moderate constipation. Mix in 8 oz liquid and drink) 14 each 0  . potassium chloride 20 MEQ TBCR Take 40 mEq by mouth daily for 5 days. 10 tablet 0  . prochlorperazine (COMPAZINE) 10 MG tablet Take 1 tablet (10 mg total) by mouth every 6 (six) hours as needed for nausea or vomiting. (Patient not taking: Reported on 10/29/2017) 30 tablet 0  . protein supplement shake (PREMIER PROTEIN) LIQD Take 2 oz by mouth daily with breakfast.     No current facility-administered medications for this visit.     SURGICAL HISTORY:  Past Surgical History:  Procedure Laterality Date  . ABDOMINAL HYSTERECTOMY    . ANKLE FRACTURE SURGERY    . APPENDECTOMY    . BLADDER SURGERY    . CHOLECYSTECTOMY N/A 05/05/2013   Procedure: LAPAROSCOPIC CHOLECYSTECTOMY WITH INTRAOPERATIVE CHOLANGIOGRAM;  Surgeon: Ralene Ok,  MD;  Location: [place];  Service: General;  Laterality: N/A;  . HIP ARTHROPLASTY Right 01/10/2014   Procedure: RIGHT HIP HEMIARTHROPLASTY;  Surgeon: Johnn Hai, MD;  Location: [facility];  Service: Orthopedics;  Laterality: Right;  DEPUY AML LATERAL WITH MARK II  . ROTATOR CUFF REPAIR Left   . TONSILLECTOMY      REVIEW OF SYSTEMS:  A comprehensive review of systems was negative except for: Constitutional: positive for fatigue   PHYSICAL EXAMINATION: General appearance: alert, cooperative and no distress Head: Normocephalic, without  obvious abnormality, atraumatic Neck: no adenopathy, no JVD, supple, symmetrical, trachea midline and thyroid not enlarged, symmetric, no tenderness/mass/nodules Lymph nodes: Cervical, supraclavicular, and axillary nodes normal. Resp: clear to auscultation bilaterally Back: symmetric, no curvature. ROM normal. No CVA tenderness. Cardio: regular rate and rhythm, S1, S2 normal, no murmur, click, rub or gallop GI: soft, non-tender; bowel sounds normal; no masses,  no organomegaly Extremities: extremities normal, atraumatic, no cyanosis or edema    ECOG PERFORMANCE STATUS: 1 - Symptomatic but completely ambulatory  Blood pressure (!) 167/74, pulse 71, temperature 98 F (36.7 C), temperature source Oral, resp. rate 18, height 5' 0.5" (1.537 m), weight 103 lb 8 oz (46.9 kg), SpO2 99 %.  LABORATORY DATA: Lab Results  Component Value Date   WBC 5.1 01/25/2018   HGB 12.7 01/25/2018   HCT 38.2 01/25/2018   MCV 91.6 01/25/2018   PLT 190 01/25/2018      Chemistry      Component Value Date/Time   NA 137 01/25/2018 1214   NA 135 (L) 05/21/2017 0829   K 4.3 01/25/2018 1214   K 3.6 05/21/2017 0829   CL 104 01/25/2018 1214   CO2 26 01/25/2018 1214   CO2 27 05/21/2017 0829   BUN 15 01/25/2018 1214   BUN 22.6 05/21/2017 0829   CREATININE 0.91 01/25/2018 1214   CREATININE 1.1 05/21/2017 0829      Component Value Date/Time   CALCIUM 9.6 01/25/2018 1214   CALCIUM 11.3 (H) 05/21/2017 0829   ALKPHOS 103 01/25/2018 1214   ALKPHOS 143 05/21/2017 0829   AST 15 01/25/2018 1214   AST 14 05/21/2017 0829   ALT 7 01/25/2018 1214   ALT 15 05/21/2017 0829   BILITOT 0.3 01/25/2018 1214   BILITOT 0.38 05/21/2017 0829       RADIOGRAPHIC STUDIES: Ct Chest W Contrast  Result Date: 01/25/2018 CLINICAL DATA:  Non small cell lung cancer, status post chemotherapy and XRT EXAM: CT CHEST WITH CONTRAST TECHNIQUE: Multidetector CT imaging of the chest was performed during intravenous contrast  administration. CONTRAST:  61mL OMNIPAQUE IOHEXOL 300 MG/ML  SOLN COMPARISON:  10/26/2017 FINDINGS: Cardiovascular: Heart is normal in size.  No pericardial effusion. No evidence of thoracic aortic aneurysm. Atherosclerotic calcifications of the aortic arch. Eccentric mural thrombus along the descending thoracic aorta and proximal descending aortic arch. Three vessel cord atherosclerosis. Mediastinum/Nodes: No suspicious mediastinal lymphadenopathy. 8 mm short axis subcarinal node. Visualized thyroid is unremarkable. Lungs/Pleura: 2.9 x 4.0 cm posterior left apical mass with central necrosis (series 7/image 26), stable versus mildly decreased. Patchy opacities in the posterior left upper lobe and superior segment left lower lobe (series 7/images 45 and 56), likely reflecting radiation changes. Right paramediastinal radiation changes. Mild centrilobular and paraseptal emphysematous changes, upper lobe predominant. No focal consolidation. 2 mm subpleural nodule in the lateral right upper lobe (series 7/image 58), unchanged. Trace pleural effusion at the left lung apex.  No pneumothorax. Upper Abdomen: Thickening of the bilateral  renal veins, left greater than right, unchanged. Musculoskeletal: Moderate compression fracture deformity at T11 with mild retropulsion, unchanged. Associated sclerosis. IMPRESSION: 2.9 x 4.0 cm left apical mass with central necrosis, stable versus mildly decreased. Associated radiation changes in the left hemithorax and right paramediastinal lung. Trace pleural fluid in the left lung apex. Thickening of the bilateral adrenal glands, left greater than right, unchanged. Aortic Atherosclerosis (ICD10-I70.0) and Emphysema (ICD10-J43.9). Electronically Signed   By: Julian Hy M.D.   On: 01/25/2018 16:39   Dg Abd Portable 1v  Result Date: 01/08/2018 CLINICAL DATA:  NG tube placement. EXAM: PORTABLE ABDOMEN - 1 VIEW COMPARISON:  01/07/2018. FINDINGS: NG tube noted with tip over superior  lower stomach. No bowel distention. Oral contrast noted throughout the colon. No free air. Aortoiliac and visceral atherosclerotic vascular calcification. Degenerative change thoracolumbar spine left hip. Stable T11 compression fracture. Right hip replacement. IMPRESSION: NG tube has been advanced with its tip over the lower stomach on today's exam. Electronically Signed   By: Marcello Moores  Register   On: 01/08/2018 06:37   Dg Abd Portable 1v-small Bowel Obstruction Protocol-initial, 8 Hr Delay  Result Date: 01/07/2018 CLINICAL DATA:  8 hour small-bowel obstruction delayed view EXAM: PORTABLE ABDOMEN - 1 VIEW COMPARISON:  [facility]een 2019, CT 18 2019 FINDINGS: Esophageal tube tip projects over GE junction, side-port at the distal esophagus. Contrast is visualized within the colon and rectum. There remains mild gaseous enlargement of small bowel up to 3 cm. Right hip replacement. IMPRESSION: 1. Contrast material is visualized in the colon and rectum. 2. There remains mild small bowel dilatation 3. Esophageal tube tip overlies the GE junction, side-port in the region of the distal esophagus, further advancement suggested for more optimal positioning. Electronically Signed   By: Donavan Foil M.D.   On: 01/07/2018 22:24   Dg Abd Portable 1v  Result Date: 01/06/2018 CLINICAL DATA:  Status post NG tube placement EXAM: PORTABLE ABDOMEN - 1 VIEW COMPARISON:  PET-CT 04/27/2017, CT 01/06/2018 FINDINGS: Patchy airspace disease in the left upper lobe and lung base. Esophageal tube tip overlies proximal stomach, side-port in the region of GE junction. Multiple loops of dilated small bowel visible in the upper abdomen. IMPRESSION: Esophageal tube tip overlies proximal stomach, side-port in the region of GE junction, suggest further advancement for more optimal positioning. Airspace disease in the left upper lobe. Multiple dilated loops of small bowel Electronically Signed   By: Donavan Foil M.D.   On: 01/06/2018 21:37   Ct  Angio Chest/abd/pel For Dissection W And/or Wo Contrast  Result Date: 01/06/2018 CLINICAL DATA:  Severe abdominal pain radiating to lower back since last night. Nausea. History of lung cancer diagnosed in 2018, appendectomy, bladder surgery. EXAM: CT ANGIOGRAPHY CHEST, ABDOMEN AND PELVIS TECHNIQUE: Multidetector CT imaging through the chest, abdomen and pelvis was performed using the standard protocol during bolus administration of intravenous contrast. Multiplanar reconstructed images and MIPs were obtained and reviewed to evaluate the vascular anatomy. CONTRAST:  64mL ISOVUE-370 IOPAMIDOL (ISOVUE-370) INJECTION 76% COMPARISON:  CT chest October 26, 2017 FINDINGS: CTA CHEST FINDINGS CARDIOVASCULAR: Thoracic aorta is normal course and caliber. Severe intimal thickening and calcific atherosclerosis. No intrinsic density on noncontrast CT. Homogeneous contrast opacification of thoracic aorta without dissection, aneurysm, luminal irregularity, periaortic fluid collections, or contrast extravasation. Heart size is normal. Moderate coronary artery calcification. Small pericardial effusion. MEDIASTINUM/NODES: No mediastinal mass or lymphadenopathy by CT size criteria. LUNGS/PLEURA: Tracheobronchial tree is patent, no pneumothorax. Patchy LEFT upper and LEFT lower lobe consolidation with  bilateral upper lobe centrilobular ground-glass and subsolid nodules measuring to 4 mm. Patchy ground-glass airspace opacities. Asymmetric LEFT apical pleural thickening, consolidation or mass. Punctate calcified granulomas. Mild centrilobular emphysema. MUSCULOSKELETAL: Non-suspicious. Osteopenia. Severe old T11 burst fracture. Subacute to old RIGHT eleventh rib fracture. Review of the MIP images confirms the above findings. CTA ABDOMEN AND PELVIS FINDINGS VASCULAR Aorta: Abdominal aorta is normal course and caliber. Moderate to severe calcific atherosclerosis. Homogeneous contrast opacification of aortoiliac vessels without dissection,  aneurysm, luminal irregularity, periaortic fluid collections, or contrast extravasation. Celiac: Patent. SMA: Patent. Renals: Patent. IMA: Patent. Inflow: Negative. Veins: Negative, not tailored for evaluation. Review of the MIP images confirms the above findings. NON-VASCULAR HEPATOBILIARY: Status post cholecystectomy.  Normal liver. PANCREAS: Normal. SPLEEN: Normal. ADRENALS/URINARY TRACT: Kidneys are orthotopic, demonstrating symmetric enhancement. No nephrolithiasis, hydronephrosis or solid renal masses. The unopacified ureters are normal in course and caliber. Urinary bladder is partially distended and unremarkable. Nodular thickening associated with cortical hyperplasia. STOMACH/BOWEL: Small hiatal hernia. Moderate colonic diverticulosis. Dilated small bowel to 3.3 cm with air-fluid levels. At least 2 transition points (RIGHT mid abdomen, LEFT upper quadrant. VASCULAR/LYMPHATIC: No lymphadenopathy by CT size criteria. REPRODUCTIVE: Normal. OTHER: Small volume low-density ascites. MUSCULOSKELETAL: Nonacute. Streak artifact from RIGHT hip arthroplasty. Small fat containing umbilical hernia. Review of the MIP images confirms the above findings. IMPRESSION: CTA CHEST: 1. No acute vascular process.  Severe atherosclerosis, unchanged. 2. 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 3. LEFT mass corresponding to known primary cancer. Similar scattered sub solid/ground-glass pulmonary nodules. CTA ABDOMEN AND PELVIS: 1. No acute vascular process. 2. Findings of small bowel obstruction, however given multiple transition points, this could reflect ileus. 3. Colonic diverticulosis without acute diverticulitis. Aortic Atherosclerosis (ICD10-I70.0). Emphysema (ICD10-J43.9). Electronically Signed   By: Elon Alas M.D.   On: 01/06/2018 18:47    ASSESSMENT AND PLAN: This is a very pleasant 84 years old white female with a stage IIIa non-small cell lung cancer, squamous cell  carcinoma.  She she completed a course of concurrent chemoradiation with weekly carboplatin and paclitaxel status post 5 cycles. She has been tolerating this treatment well except for the significant odynophagia secondary to radiation induced esophagitis. The patient has partial response to this treatment. She is currently on observation and she is feeling fine for mild fatigue. She had repeat CT scan of the chest, abdomen and pelvis performed recently.  I personally and independently reviewed the scans and discussed the results with the patient.  Her scan showed no concerning findings for disease progression. I recommended for the patient to continue on observation with repeat CT scan of the chest in 3 months. She was advised to call immediately if she has any concerning symptoms in the interval. The patient voices understanding of current disease status and treatment options and is in agreement with the current care plan. All questions were answered. The patient knows to call the clinic with any problems, questions or concerns. We can certainly see the patient much sooner if necessary.  I spent 10 minutes counseling the patient face to face. The total time spent in the appointment was15 minutes.  Disclaimer: This note was dictated with voice recognition software. Similar sounding words can inadvertently be transcribed and may not be corrected upon review.

## 2018-01-28 NOTE — Telephone Encounter (Signed)
Scheduled appt per 9/9 los - gave patient AVS and calender per los.   

## 2018-02-13 DIAGNOSIS — H903 Sensorineural hearing loss, bilateral: Secondary | ICD-10-CM | POA: Insufficient documentation

## 2018-03-11 ENCOUNTER — Telehealth: Payer: Self-pay

## 2018-03-11 NOTE — Telephone Encounter (Signed)
Phone call placed to patient to check in and to offer follow up visit. VM left

## 2018-03-25 ENCOUNTER — Telehealth: Payer: Self-pay | Admitting: Internal Medicine

## 2018-03-25 NOTE — Telephone Encounter (Signed)
MM PAL 12/9 - moved f/u to 12/10. Left message and mailed schedule. Other appointments remain the same.

## 2018-03-26 ENCOUNTER — Telehealth: Payer: Self-pay | Admitting: Primary Care

## 2018-03-26 NOTE — Telephone Encounter (Signed)
VM left to schedule palliative care visit.

## 2018-04-23 ENCOUNTER — Telehealth: Payer: Self-pay | Admitting: Internal Medicine

## 2018-04-23 ENCOUNTER — Telehealth: Payer: Self-pay | Admitting: Primary Care

## 2018-04-23 NOTE — Telephone Encounter (Signed)
MM PAL 12/10 - moved f/u to 12/11. Spoke with patient.

## 2018-04-23 NOTE — Telephone Encounter (Signed)
T/c  To schedule palliative consultation. No answer, left message.

## 2018-04-26 ENCOUNTER — Ambulatory Visit (HOSPITAL_COMMUNITY)
Admission: RE | Admit: 2018-04-26 | Discharge: 2018-04-26 | Disposition: A | Discharge status: Home / Self Care | Payer: Medicare Other | Source: Ambulatory Visit | Attending: Internal Medicine | Admitting: Internal Medicine

## 2018-04-26 ENCOUNTER — Inpatient Hospital Stay: Payer: Medicare Other | Attending: Internal Medicine

## 2018-04-26 DIAGNOSIS — Z7982 Long term (current) use of aspirin: Secondary | ICD-10-CM | POA: Diagnosis not present

## 2018-04-26 DIAGNOSIS — Z923 Personal history of irradiation: Secondary | ICD-10-CM | POA: Diagnosis not present

## 2018-04-26 DIAGNOSIS — Z79899 Other long term (current) drug therapy: Secondary | ICD-10-CM | POA: Insufficient documentation

## 2018-04-26 DIAGNOSIS — C3412 Malignant neoplasm of upper lobe, left bronchus or lung: Secondary | ICD-10-CM | POA: Diagnosis not present

## 2018-04-26 DIAGNOSIS — C3490 Malignant neoplasm of unspecified part of unspecified bronchus or lung: Secondary | ICD-10-CM | POA: Insufficient documentation

## 2018-04-26 DIAGNOSIS — Z9221 Personal history of antineoplastic chemotherapy: Secondary | ICD-10-CM | POA: Insufficient documentation

## 2018-04-26 LAB — CBC WITH DIFFERENTIAL (CANCER CENTER ONLY)
ABS IMMATURE GRANULOCYTES: 0.01 10*3/uL (ref 0.00–0.07)
BASOS PCT: 1 %
Basophils Absolute: 0.1 10*3/uL (ref 0.0–0.1)
EOS ABS: 0.2 10*3/uL (ref 0.0–0.5)
Eosinophils Relative: 3 %
HEMATOCRIT: 41.5 % (ref 36.0–46.0)
Hemoglobin: 13.4 g/dL (ref 12.0–15.0)
Immature Granulocytes: 0 %
LYMPHS ABS: 1.1 10*3/uL (ref 0.7–4.0)
Lymphocytes Relative: 21 %
MCH: 29.8 pg (ref 26.0–34.0)
MCHC: 32.3 g/dL (ref 30.0–36.0)
MCV: 92.4 fL (ref 80.0–100.0)
MONO ABS: 0.5 10*3/uL (ref 0.1–1.0)
MONOS PCT: 10 %
Neutro Abs: 3.4 10*3/uL (ref 1.7–7.7)
Neutrophils Relative %: 65 %
PLATELETS: 201 10*3/uL (ref 150–400)
RBC: 4.49 MIL/uL (ref 3.87–5.11)
RDW: 14 % (ref 11.5–15.5)
WBC Count: 5.3 10*3/uL (ref 4.0–10.5)
nRBC: 0 % (ref 0.0–0.2)

## 2018-04-26 LAB — CMP (CANCER CENTER ONLY)
ALBUMIN: 3.7 g/dL (ref 3.5–5.0)
ALK PHOS: 123 U/L (ref 38–126)
ALT: 14 U/L (ref 0–44)
AST: 21 U/L (ref 15–41)
Anion gap: 11 (ref 5–15)
BILIRUBIN TOTAL: 0.3 mg/dL (ref 0.3–1.2)
BUN: 23 mg/dL (ref 8–23)
CALCIUM: 9.8 mg/dL (ref 8.9–10.3)
CO2: 27 mmol/L (ref 22–32)
CREATININE: 0.95 mg/dL (ref 0.44–1.00)
Chloride: 100 mmol/L (ref 98–111)
GFR, Est AFR Am: >60 mL/min (ref >60)
GFR, Estimated: 54 mL/min — ABNORMAL LOW (ref >60)
GLUCOSE: 109 mg/dL — AB (ref 70–99)
Potassium: 4.2 mmol/L (ref 3.5–5.1)
Sodium: 138 mmol/L (ref 135–145)
TOTAL PROTEIN: 7.5 g/dL (ref 6.5–8.1)

## 2018-04-26 MED ORDER — IOHEXOL 300 MG/ML  SOLN
75.0000 mL | Freq: Once | INTRAMUSCULAR | Status: AC | PRN
  Administered 2018-04-26: 75 mL via INTRAVENOUS

## 2018-04-26 MED ORDER — SODIUM CHLORIDE (PF) 0.9 % IJ SOLN
INTRAMUSCULAR | Status: AC
  Filled 2018-04-26: qty 50

## 2018-04-29 ENCOUNTER — Ambulatory Visit: Payer: Self-pay | Admitting: Internal Medicine

## 2018-04-30 ENCOUNTER — Ambulatory Visit: Payer: Self-pay | Admitting: Internal Medicine

## 2018-05-01 ENCOUNTER — Inpatient Hospital Stay (HOSPITAL_BASED_OUTPATIENT_CLINIC_OR_DEPARTMENT_OTHER): Payer: Medicare Other | Admitting: Internal Medicine

## 2018-05-01 ENCOUNTER — Telehealth: Payer: Self-pay | Admitting: Primary Care

## 2018-05-01 ENCOUNTER — Encounter: Payer: Self-pay | Admitting: Internal Medicine

## 2018-05-01 ENCOUNTER — Telehealth: Payer: Self-pay | Admitting: Internal Medicine

## 2018-05-01 VITALS — BP 174/81 | HR 85 | Temp 97.7°F | Resp 16 | Ht 60.5 in | Wt 107.1 lb

## 2018-05-01 DIAGNOSIS — Z79899 Other long term (current) drug therapy: Secondary | ICD-10-CM | POA: Diagnosis not present

## 2018-05-01 DIAGNOSIS — C3412 Malignant neoplasm of upper lobe, left bronchus or lung: Secondary | ICD-10-CM | POA: Diagnosis not present

## 2018-05-01 DIAGNOSIS — Z9221 Personal history of antineoplastic chemotherapy: Secondary | ICD-10-CM

## 2018-05-01 DIAGNOSIS — Z7982 Long term (current) use of aspirin: Secondary | ICD-10-CM

## 2018-05-01 DIAGNOSIS — I10 Essential (primary) hypertension: Secondary | ICD-10-CM

## 2018-05-01 DIAGNOSIS — C3490 Malignant neoplasm of unspecified part of unspecified bronchus or lung: Secondary | ICD-10-CM

## 2018-05-01 DIAGNOSIS — Z923 Personal history of irradiation: Secondary | ICD-10-CM

## 2018-05-01 NOTE — Progress Notes (Signed)
[place] [phone]   Fax:([phone]) [phone]  OFFICE PROGRESS NOTE  Leonard Downing, MD [place] [address]  DIAGNOSIS: Stage IIIA (T2b, N2, M0) non-small cell lung cancer, squamous cell carcinoma presented with left upper lobe lung mass left hilar and mediastinal lymphadenopathy diagnosed in November 2018. PDL 1 less than 1%.  PRIOR THERAPY: Concurrent chemoradiation with weekly carboplatin for AUC of 2 and paclitaxel 45 mg/M2.  First dose May 21, 2017, status post 5 cycles.  Last dose was given on June 18, 2017.  CURRENT THERAPY: Observation.  INTERVAL HISTORY: [name] 84 y.o. female returns to the clinic today for follow-up visit accompanied by her daughter.  The patient is feeling fine today with no specific complaints except for intermittent pain on the left side of the chest as well as shoulder area.  She denied having any associated shortness of breath, nausea, vomiting or palpitation.  She denied having any fever or chills.  She has no headache or visual changes.  The patient denied having any significant weight loss or night sweats.  She is currently on observation and she has a repeat CT scan of the chest performed recently.  She is here for evaluation and discussion of her risk her results.  MEDICAL HISTORY: Past Medical History:  Diagnosis Date  . Abdominal aortic atherosclerosis ([place])   . CAD (coronary artery disease)   . Cancer ([place])   . Carotid artery occlusion   . Common bile duct dilation   . Gallstones   . Headache(784.0)   . Hiatal hernia   . HLD (hyperlipidemia)   . Myocardial infarction ([place])   . PONV (postoperative nausea and vomiting)   . Stroke [facility]) 2003    ALLERGIES:  is allergic to dextroamphetamine sulfate er.  MEDICATIONS:  Current Outpatient Medications  Medication Sig Dispense Refill  . aspirin 81 MG EC tablet Take 81 mg by mouth daily.      . busPIRone (BUSPAR) 10  MG tablet Take 10 mg by mouth daily as needed (anxiety).     Marland Kitchen docusate sodium (COLACE) 100 MG capsule Take 100 mg by mouth 2 (two) times daily.    . Multiple Vitamins-Minerals (MULTIVITAMIN ADULT EXTRA C) CHEW Chew by mouth.    . polyethylene glycol (MIRALAX / GLYCOLAX) packet Take 17 g daily as needed by mouth. (Patient taking differently: Take 17 g by mouth daily as needed for moderate constipation. Mix in 8 oz liquid and drink) 14 each 0  . protein supplement shake (PREMIER PROTEIN) LIQD Take 2 oz by mouth daily with breakfast.    . ALPRAZolam (XANAX) 0.25 MG tablet Take 0.5 tablets (0.125 mg total) by mouth 2 (two) times daily as needed for anxiety. (Patient not taking: Reported on 05/01/2018) 10 tablet 0  . potassium chloride 20 MEQ TBCR Take 40 mEq by mouth daily for 5 days. 10 tablet 0  . potassium chloride SA (K-DUR,KLOR-CON) 20 MEQ tablet     . prochlorperazine (COMPAZINE) 10 MG tablet Take 1 tablet (10 mg total) by mouth every 6 (six) hours as needed for nausea or vomiting. (Patient not taking: Reported on 05/01/2018) 30 tablet 0   No current facility-administered medications for this visit.     SURGICAL HISTORY:  Past Surgical History:  Procedure Laterality Date  . ABDOMINAL HYSTERECTOMY    . ANKLE FRACTURE SURGERY    . APPENDECTOMY    . BLADDER SURGERY    . CHOLECYSTECTOMY N/A  05/05/2013   Procedure: LAPAROSCOPIC CHOLECYSTECTOMY WITH INTRAOPERATIVE CHOLANGIOGRAM;  Surgeon: Ralene Ok, MD;  Location: [facility];  Service: General;  Laterality: N/A;  . HIP ARTHROPLASTY Right 01/10/2014   Procedure: RIGHT HIP HEMIARTHROPLASTY;  Surgeon: Johnn Hai, MD;  Location: [facility];  Service: Orthopedics;  Laterality: Right;  DEPUY AML LATERAL WITH MARK II  . ROTATOR CUFF REPAIR Left   . TONSILLECTOMY      REVIEW OF SYSTEMS:  A comprehensive review of systems was negative except for: Constitutional: positive for fatigue Respiratory: positive for pleurisy/chest pain   PHYSICAL  EXAMINATION: General appearance: alert, cooperative and no distress Head: Normocephalic, without obvious abnormality, atraumatic Neck: no adenopathy, no JVD, supple, symmetrical, trachea midline and thyroid not enlarged, symmetric, no tenderness/mass/nodules Lymph nodes: Cervical, supraclavicular, and axillary nodes normal. Resp: clear to auscultation bilaterally Back: symmetric, no curvature. ROM normal. No CVA tenderness. Cardio: regular rate and rhythm, S1, S2 normal, no murmur, click, rub or gallop GI: soft, non-tender; bowel sounds normal; no masses,  no organomegaly Extremities: extremities normal, atraumatic, no cyanosis or edema    ECOG PERFORMANCE STATUS: 1 - Symptomatic but completely ambulatory  Blood pressure (!) 174/81, pulse 85, temperature 97.7 F (36.5 C), temperature source Oral, resp. rate 16, height 5' 0.5" (1.537 m), weight 107 lb 1.6 oz (48.6 kg), SpO2 98 %.  LABORATORY DATA: Lab Results  Component Value Date   WBC 5.3 04/26/2018   HGB 13.4 04/26/2018   HCT 41.5 04/26/2018   MCV 92.4 04/26/2018   PLT 201 04/26/2018      Chemistry      Component Value Date/Time   NA 138 04/26/2018 1024   NA 135 (L) 05/21/2017 0829   K 4.2 04/26/2018 1024   K 3.6 05/21/2017 0829   CL 100 04/26/2018 1024   CO2 27 04/26/2018 1024   CO2 27 05/21/2017 0829   BUN 23 04/26/2018 1024   BUN 22.6 05/21/2017 0829   CREATININE 0.95 04/26/2018 1024   CREATININE 1.1 05/21/2017 0829      Component Value Date/Time   CALCIUM 9.8 04/26/2018 1024   CALCIUM 11.3 (H) 05/21/2017 0829   ALKPHOS 123 04/26/2018 1024   ALKPHOS 143 05/21/2017 0829   AST 21 04/26/2018 1024   AST 14 05/21/2017 0829   ALT 14 04/26/2018 1024   ALT 15 05/21/2017 0829   BILITOT 0.3 04/26/2018 1024   BILITOT 0.38 05/21/2017 0829       RADIOGRAPHIC STUDIES: Ct Chest W Contrast  Result Date: 04/26/2018 CLINICAL DATA:  Lung cancer restaging. EXAM: CT CHEST WITH CONTRAST TECHNIQUE: Multidetector CT imaging of  the chest was performed during intravenous contrast administration. CONTRAST:  44mL OMNIPAQUE IOHEXOL 300 MG/ML  SOLN COMPARISON:  Multiple previous chest CTs. The most recent is 01/25/2018 FINDINGS: Cardiovascular: The heart is normal in size and stable. No pericardial effusion. Severe atherosclerotic disease involving the thoracic aorta but no dissection or focal aneurysm. The branch vessels are patent. Moderate atherosclerotic calcifications. Stable coronary artery calcifications. Mild pulmonary artery enlargement may suggest pulmonary hypertension. Mediastinum/Nodes: No mediastinal or hilar mass or lymphadenopathy. Mild wall thickening of the midesophagus could be radiation related change. Lungs/Pleura: Stable left apical soft tissue density with central low attenuation, likely treated tumor. No progressive changes. Stable surrounding radiation changes involving the left upper lobe and left paramediastinal lung and left hilum. Upper Abdomen: No significant upper abdominal findings. No obvious hepatic or adrenal gland metastasis. Stable moderate thickening of both adrenal glands which is unchanged. Musculoskeletal: No chest  wall mass, supraclavicular or axillary adenopathy. The bony thorax is intact. No worrisome bone lesions. Stable significant compression fracture of T11. IMPRESSION: 1. Stable left apical lung lesion with central necrosis and surrounding radiation changes. No new or progressive findings. 2. Mild wall thickening of the midesophagus could be due to radiation change. 3. No metastatic pulmonary disease is identified. 4. Stable thickened adrenal glands bilaterally without discrete mass. 5. Stable severe/advanced atherosclerotic disease involving the thoracic and abdominal aorta. Aortic Atherosclerosis (ICD10-I70.0) and Emphysema (ICD10-J43.9). Electronically Signed   By: Marijo Sanes M.D.   On: 04/26/2018 16:47    ASSESSMENT AND PLAN: This is a very pleasant 84 years old white female with a  stage IIIa non-small cell lung cancer, squamous cell carcinoma.  She she completed a course of concurrent chemoradiation with weekly carboplatin and paclitaxel status post 5 cycles. She has been tolerating this treatment well except for the significant odynophagia secondary to radiation induced esophagitis. The patient has partial response to this treatment. The patient has been in observation since that time and she is feeling fine with no concerning complaints. She had repeat CT scan of the chest performed recently.   I personally and independently reviewed the scans and discussed the results with the patient and her daughter today.  Her scan showed no concerning findings for disease progression. I recommended for the patient to continue on observation with repeat CT scan of the chest in 4 months. The patient was advised to call immediately if she has any concerning symptoms in the interval. The patient voices understanding of current disease status and treatment options and is in agreement with the current care plan. All questions were answered. The patient knows to call the clinic with any problems, questions or concerns. We can certainly see the patient much sooner if necessary.  I spent 10 minutes counseling the patient face to face. The total time spent in the appointment was15 minutes.  Disclaimer: This note was dictated with voice recognition software. Similar sounding words can inadvertently be transcribed and may not be corrected upon review.

## 2018-05-01 NOTE — Telephone Encounter (Signed)
Gave patient avs report and appointments for April. Central radiology will call re scan.

## 2018-05-01 NOTE — Telephone Encounter (Signed)
Left vm messages to call to schedule palliative care visit.

## 2018-08-30 ENCOUNTER — Other Ambulatory Visit: Payer: Self-pay

## 2018-08-30 ENCOUNTER — Ambulatory Visit (HOSPITAL_COMMUNITY): Admission: RE | Admit: 2018-08-30 | Payer: Medicare Other | Source: Ambulatory Visit

## 2018-09-03 ENCOUNTER — Ambulatory Visit: Payer: Self-pay | Admitting: Internal Medicine

## 2018-09-30 ENCOUNTER — Other Ambulatory Visit (HOSPITAL_COMMUNITY): Payer: Medicare Other

## 2018-09-30 ENCOUNTER — Other Ambulatory Visit: Payer: Self-pay

## 2018-10-01 ENCOUNTER — Ambulatory Visit (HOSPITAL_COMMUNITY)
Admission: RE | Admit: 2018-10-01 | Discharge: 2018-10-01 | Disposition: A | Discharge status: Home / Self Care | Payer: Medicare Other | Source: Ambulatory Visit | Attending: Internal Medicine | Admitting: Internal Medicine

## 2018-10-01 ENCOUNTER — Encounter (HOSPITAL_COMMUNITY): Payer: Self-pay

## 2018-10-01 ENCOUNTER — Inpatient Hospital Stay: Payer: Medicare Other | Attending: Internal Medicine

## 2018-10-01 ENCOUNTER — Other Ambulatory Visit: Payer: Self-pay

## 2018-10-01 DIAGNOSIS — Z85118 Personal history of other malignant neoplasm of bronchus and lung: Secondary | ICD-10-CM | POA: Diagnosis not present

## 2018-10-01 DIAGNOSIS — R911 Solitary pulmonary nodule: Secondary | ICD-10-CM | POA: Insufficient documentation

## 2018-10-01 DIAGNOSIS — Z923 Personal history of irradiation: Secondary | ICD-10-CM | POA: Insufficient documentation

## 2018-10-01 DIAGNOSIS — K208 Other esophagitis: Secondary | ICD-10-CM | POA: Diagnosis not present

## 2018-10-01 DIAGNOSIS — C3490 Malignant neoplasm of unspecified part of unspecified bronchus or lung: Secondary | ICD-10-CM | POA: Insufficient documentation

## 2018-10-01 DIAGNOSIS — Z9221 Personal history of antineoplastic chemotherapy: Secondary | ICD-10-CM | POA: Diagnosis not present

## 2018-10-01 LAB — CMP (CANCER CENTER ONLY)
ALT: 19 U/L (ref 0–44)
AST: 22 U/L (ref 15–41)
Albumin: 3.6 g/dL (ref 3.5–5.0)
Alkaline Phosphatase: 129 U/L — ABNORMAL HIGH (ref 38–126)
Anion gap: 9 (ref 5–15)
BUN: 18 mg/dL (ref 8–23)
CO2: 26 mmol/L (ref 22–32)
Calcium: 9.3 mg/dL (ref 8.9–10.3)
Chloride: 103 mmol/L (ref 98–111)
Creatinine: 0.95 mg/dL (ref 0.44–1.00)
GFR, Est AFR Am: >60 mL/min (ref >60)
GFR, Estimated: 54 mL/min — ABNORMAL LOW (ref >60)
Glucose, Bld: 104 mg/dL — ABNORMAL HIGH (ref 70–99)
Potassium: 4 mmol/L (ref 3.5–5.1)
Sodium: 138 mmol/L (ref 135–145)
Total Bilirubin: 0.4 mg/dL (ref 0.3–1.2)
Total Protein: 7.8 g/dL (ref 6.5–8.1)

## 2018-10-01 LAB — CBC WITH DIFFERENTIAL (CANCER CENTER ONLY)
Abs Immature Granulocytes: 0.01 10*3/uL (ref 0.00–0.07)
Basophils Absolute: 0.1 10*3/uL (ref 0.0–0.1)
Basophils Relative: 1 %
Eosinophils Absolute: 0.2 10*3/uL (ref 0.0–0.5)
Eosinophils Relative: 4 %
HCT: 44 % (ref 36.0–46.0)
Hemoglobin: 13.9 g/dL (ref 12.0–15.0)
Immature Granulocytes: 0 %
Lymphocytes Relative: 22 %
Lymphs Abs: 1.2 10*3/uL (ref 0.7–4.0)
MCH: 29.8 pg (ref 26.0–34.0)
MCHC: 31.6 g/dL (ref 30.0–36.0)
MCV: 94.2 fL (ref 80.0–100.0)
Monocytes Absolute: 0.5 10*3/uL (ref 0.1–1.0)
Monocytes Relative: 9 %
Neutro Abs: 3.5 10*3/uL (ref 1.7–7.7)
Neutrophils Relative %: 64 %
Platelet Count: 206 10*3/uL (ref 150–400)
RBC: 4.67 MIL/uL (ref 3.87–5.11)
RDW: 13.7 % (ref 11.5–15.5)
WBC Count: 5.5 10*3/uL (ref 4.0–10.5)
nRBC: 0 % (ref 0.0–0.2)

## 2018-10-01 MED ORDER — IOHEXOL 300 MG/ML  SOLN
75.0000 mL | Freq: Once | INTRAMUSCULAR | Status: AC | PRN
  Administered 2018-10-01: 75 mL via INTRAVENOUS

## 2018-10-01 MED ORDER — SODIUM CHLORIDE (PF) 0.9 % IJ SOLN
INTRAMUSCULAR | Status: AC
  Filled 2018-10-01: qty 50

## 2018-10-03 ENCOUNTER — Other Ambulatory Visit: Payer: Self-pay

## 2018-10-03 ENCOUNTER — Encounter: Payer: Self-pay | Admitting: Internal Medicine

## 2018-10-03 ENCOUNTER — Inpatient Hospital Stay (HOSPITAL_BASED_OUTPATIENT_CLINIC_OR_DEPARTMENT_OTHER): Payer: Medicare Other | Admitting: Internal Medicine

## 2018-10-03 VITALS — BP 173/83 | HR 84 | Temp 98.1°F | Resp 20 | Ht 60.5 in | Wt 113.9 lb

## 2018-10-03 DIAGNOSIS — Z85118 Personal history of other malignant neoplasm of bronchus and lung: Secondary | ICD-10-CM

## 2018-10-03 DIAGNOSIS — Z923 Personal history of irradiation: Secondary | ICD-10-CM

## 2018-10-03 DIAGNOSIS — Z9221 Personal history of antineoplastic chemotherapy: Secondary | ICD-10-CM | POA: Diagnosis not present

## 2018-10-03 DIAGNOSIS — K208 Other esophagitis: Secondary | ICD-10-CM | POA: Diagnosis not present

## 2018-10-03 DIAGNOSIS — C3490 Malignant neoplasm of unspecified part of unspecified bronchus or lung: Secondary | ICD-10-CM

## 2018-10-03 DIAGNOSIS — R911 Solitary pulmonary nodule: Secondary | ICD-10-CM

## 2018-10-03 DIAGNOSIS — C3412 Malignant neoplasm of upper lobe, left bronchus or lung: Secondary | ICD-10-CM

## 2018-10-03 NOTE — Progress Notes (Signed)
[place] Telephone:([phone]) [phone]   Fax:(336) [phone]  OFFICE PROGRESS NOTE  Leonard Downing, MD [place] [address]  DIAGNOSIS: Stage IIIA (T2b, N2, M0) non-small cell lung cancer, squamous cell carcinoma presented with left upper lobe lung mass left hilar and mediastinal lymphadenopathy diagnosed in November 2018. PDL 1 less than 1%.  PRIOR THERAPY: Concurrent chemoradiation with weekly carboplatin for AUC of 2 and paclitaxel 45 mg/M2.  First dose May 21, 2017, status post 5 cycles.  Last dose was given on June 18, 2017.  CURRENT THERAPY: Observation.  INTERVAL HISTORY: [name] 85 y.o. female returns to the clinic today for follow-up visit.  The patient is feeling fine today with no concerning complaints.  She denied having any chest pain, shortness of breath, cough or hemoptysis.  She denied having any fever or chills.  She has no nausea, vomiting, diarrhea or constipation.  She denied having any weight loss or night sweats.  She had repeat CT scan of the chest performed recently and she is here for evaluation and discussion of her scan results.  MEDICAL HISTORY: Past Medical History:  Diagnosis Date  . Abdominal aortic atherosclerosis ([place])   . CAD (coronary artery disease)   . Cancer ([place])   . Carotid artery occlusion   . Common bile duct dilation   . Gallstones   . Headache(784.0)   . Hiatal hernia   . HLD (hyperlipidemia)   . Myocardial infarction ([place])   . PONV (postoperative nausea and vomiting)   . Stroke [facility]) 2003    ALLERGIES:  is allergic to dextroamphetamine sulfate er.  MEDICATIONS:  Current Outpatient Medications  Medication Sig Dispense Refill  . ALPRAZolam (XANAX) 0.25 MG tablet Take 0.5 tablets (0.125 mg total) by mouth 2 (two) times daily as needed for anxiety. (Patient not taking: Reported on 05/01/2018) 10 tablet 0  . aspirin 81 MG EC tablet Take 81 mg by mouth daily.      .  busPIRone (BUSPAR) 10 MG tablet Take 10 mg by mouth daily as needed (anxiety).     Marland Kitchen docusate sodium (COLACE) 100 MG capsule Take 100 mg by mouth 2 (two) times daily.    . Multiple Vitamins-Minerals (MULTIVITAMIN ADULT EXTRA C) CHEW Chew by mouth.    . polyethylene glycol (MIRALAX / GLYCOLAX) packet Take 17 g daily as needed by mouth. (Patient taking differently: Take 17 g by mouth daily as needed for moderate constipation. Mix in 8 oz liquid and drink) 14 each 0  . potassium chloride 20 MEQ TBCR Take 40 mEq by mouth daily for 5 days. 10 tablet 0  . potassium chloride SA (K-DUR,KLOR-CON) 20 MEQ tablet     . prochlorperazine (COMPAZINE) 10 MG tablet Take 1 tablet (10 mg total) by mouth every 6 (six) hours as needed for nausea or vomiting. (Patient not taking: Reported on 05/01/2018) 30 tablet 0  . protein supplement shake (PREMIER PROTEIN) LIQD Take 2 oz by mouth daily with breakfast.     No current facility-administered medications for this visit.     SURGICAL HISTORY:  Past Surgical History:  Procedure Laterality Date  . ABDOMINAL HYSTERECTOMY    . ANKLE FRACTURE SURGERY    . APPENDECTOMY    . BLADDER SURGERY    . CHOLECYSTECTOMY N/A 05/05/2013   Procedure: LAPAROSCOPIC CHOLECYSTECTOMY WITH INTRAOPERATIVE CHOLANGIOGRAM;  Surgeon: Ralene Ok, MD;  Location: [facility];  Service: General;  Laterality: N/A;  . HIP ARTHROPLASTY Right  01/10/2014   Procedure: RIGHT HIP HEMIARTHROPLASTY;  Surgeon: Johnn Hai, MD;  Location[facility];  Service: Orthopedics;  Laterality: Right;  DEPUY AML LATERAL WITH MARK II  . ROTATOR CUFF REPAIR Left   . TONSILLECTOMY      REVIEW OF SYSTEMS:  A comprehensive review of systems was negative.   PHYSICAL EXAMINATION: General appearance: alert, cooperative and no distress Head: Normocephalic, without obvious abnormality, atraumatic Neck: no adenopathy, no JVD, supple, symmetrical, trachea midline and thyroid not enlarged, symmetric, no tenderness/mass/nodules  Lymph nodes: Cervical, supraclavicular, and axillary nodes normal. Resp: clear to auscultation bilaterally Back: symmetric, no curvature. ROM normal. No CVA tenderness. Cardio: regular rate and rhythm, S1, S2 normal, no murmur, click, rub or gallop GI: soft, non-tender; bowel sounds normal; no masses,  no organomegaly Extremities: extremities normal, atraumatic, no cyanosis or edema    ECOG PERFORMANCE STATUS: 1 - Symptomatic but completely ambulatory  Blood pressure (!) 173/83, pulse 84, temperature 98.1 F (36.7 C), temperature source Oral, resp. rate 20, height 5' 0.5" (1.537 m), weight 113 lb 14.4 oz (51.7 kg), SpO2 96 %.  LABORATORY DATA: Lab Results  Component Value Date   WBC 5.5 10/01/2018   HGB 13.9 10/01/2018   HCT 44.0 10/01/2018   MCV 94.2 10/01/2018   PLT 206 10/01/2018      Chemistry      Component Value Date/Time   NA 138 10/01/2018 1118   NA 135 (L) 05/21/2017 0829   K 4.0 10/01/2018 1118   K 3.6 05/21/2017 0829   CL 103 10/01/2018 1118   CO2 26 10/01/2018 1118   CO2 27 05/21/2017 0829   BUN 18 10/01/2018 1118   BUN 22.6 05/21/2017 0829   CREATININE 0.95 10/01/2018 1118   CREATININE 1.1 05/21/2017 0829      Component Value Date/Time   CALCIUM 9.3 10/01/2018 1118   CALCIUM 11.3 (H) 05/21/2017 0829   ALKPHOS 129 (H) 10/01/2018 1118   ALKPHOS 143 05/21/2017 0829   AST 22 10/01/2018 1118   AST 14 05/21/2017 0829   ALT 19 10/01/2018 1118   ALT 15 05/21/2017 0829   BILITOT 0.4 10/01/2018 1118   BILITOT 0.38 05/21/2017 0829       RADIOGRAPHIC STUDIES: Ct Chest W Contrast  Result Date: 10/02/2018 CLINICAL DATA:  Non-small-cell lung cancer of the left upper lobe. EXAM: CT CHEST WITH CONTRAST TECHNIQUE: Multidetector CT imaging of the chest was performed during intravenous contrast administration. CONTRAST:  52mL OMNIPAQUE IOHEXOL 300 MG/ML  SOLN COMPARISON:  04/26/2018 FINDINGS: Cardiovascular: The heart size is normal. No substantial pericardial  effusion. Coronary artery calcification is evident. Atherosclerotic calcification is noted in the wall of the thoracic aorta. Mediastinum/Nodes: No mediastinal lymphadenopathy. Soft tissue in the left hilar region is stable and likely treatment related. No right hilar lymphadenopathy. There is no axillary lymphadenopathy. The mild wall thickening seen in the mid esophagus previously is less conspicuous today. Lungs/Pleura: The central tracheobronchial airways are patent. Centrilobular emphsyema noted. 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 Left apically lesion similar to slightly smaller than prior, measuring 3.4 x 2.6 cm today (measured in a similar fashion as on the prior exam) which compares to 3.8 x 2.9 cm previously. Apparent fibrotic changes medial paramediastinal right lung presumably radiation scarring. 4 mm right upper lobe nodule (63/7) is mildly more conspicuous than on the prior study. No evidence for pleural effusion. Upper Abdomen: Mild bilateral adrenal thickening again noted. Stable mild prominence of the extrahepatic bile ducts and main  pancreatic duct, although this anatomy is incompletely visualized. No evidence for lymphadenopathy in the upper abdomen. Musculoskeletal: No worrisome lytic or sclerotic osseous abnormality. Stable T11 compression fracture. IMPRESSION: 1. No substantial change left apical lesion with surrounding post treatment scarring. 2. Slight interval increase in size of a 4 mm peripheral right upper lobe pulmonary nodule. Attention on follow-up recommended. 3.  Aortic Atherosclerois (ICD10-170.0) 4.  Emphysema. ([id number]) Electronically Signed   By: Misty Stanley M.D.   On: 10/02/2018 10:28    ASSESSMENT AND PLAN: This is a very pleasant 85 years old white female with a stage IIIa non-small cell lung cancer, squamous cell carcinoma.  She she completed a course of concurrent chemoradiation with weekly carboplatin and paclitaxel status post 5  cycles. She has been tolerating this treatment well except for the significant odynophagia secondary to radiation induced esophagitis. The patient has partial response to this treatment. The patient is currently on observation and she is feeling fine. She had repeat CT scan of the chest performed recently.  I personally and independently reviewed the scans and discussed the results with the patient today. PET scan showed no concerning findings for disease progression and will continue to monitor with the 4 mm right upper lobe pulmonary nodule closely on the upcoming imaging studies. I will see the patient back for follow-up visit in 6 months with repeat CT scan of the chest. She was advised to call immediately if she has any concerning symptoms in the interval. The patient voices understanding of current disease status and treatment options and is in agreement with the current care plan. All questions were answered. The patient knows to call the clinic with any problems, questions or concerns. We can certainly see the patient much sooner if necessary.  I spent 10 minutes counseling the patient face to face. The total time spent in the appointment was15 minutes.  Disclaimer: This note was dictated with voice recognition software. Similar sounding words can inadvertently be transcribed and may not be corrected upon review.

## 2018-10-03 NOTE — Addendum Note (Signed)
Addended by: Ardeen Garland on: 10/03/2018 12:26 PM   Modules accepted: Orders

## 2018-10-04 ENCOUNTER — Telehealth: Payer: Self-pay | Admitting: Internal Medicine

## 2018-10-04 NOTE — Telephone Encounter (Signed)
Scheduled appt per 5/14 los - letter mailed with appt date and time

## 2018-12-31 ENCOUNTER — Other Ambulatory Visit: Payer: Self-pay

## 2018-12-31 ENCOUNTER — Ambulatory Visit (INDEPENDENT_AMBULATORY_CARE_PROVIDER_SITE_OTHER): Payer: Medicare Other | Admitting: Podiatry

## 2018-12-31 DIAGNOSIS — M79676 Pain in unspecified toe(s): Secondary | ICD-10-CM

## 2018-12-31 DIAGNOSIS — M2041 Other hammer toe(s) (acquired), right foot: Secondary | ICD-10-CM

## 2018-12-31 DIAGNOSIS — B351 Tinea unguium: Secondary | ICD-10-CM

## 2018-12-31 DIAGNOSIS — M2042 Other hammer toe(s) (acquired), left foot: Secondary | ICD-10-CM

## 2019-01-01 ENCOUNTER — Encounter: Payer: Self-pay | Admitting: Podiatry

## 2019-01-01 NOTE — Progress Notes (Signed)
She presents today states that she is concerned about the funny sensations in her feet she has numbness balance issues with the feet and legs staying cold she is concerned that her circulation ask circulation problems and she has painful elongated toenails.  Review of systems she denies fever chills nausea vomiting muscle aches pains calf pain back pain chest pain shortness of breath.  Objective: Vital signs are stable she is alert and oriented x3 pulses are palpable capillary fill time is immediate neurologic sensorium appears to be intact.  Deep tendon reflexes are intact muscle strength is normal and symmetrical for her age.  No open lesions or wounds are noted.  She does have some venous insufficiency and varicosities along the ankle and the posterior calf.  Right calf appears to be smaller than that of the left more than likely secondary to surgery on that right side.  Hammertoe deformities and hallux valgus deformities bilaterally toenails are thick yellow dystrophic clinically mycotic painful palpation.  Assessment: Venous insufficiency bilateral probably some instability due to either neuropathy or previous surgeries.  Painful elongated toenails.  Plan: Discussed physical therapy with her which she declined.  I discussed gait training with her but she declined.  I discussed vascular evaluation and she declined.  I debrided her toenails 1 through 5 bilaterally which made her happy.  We also placed a spacer between her great toe and her second toe left foot

## 2019-01-19 IMAGING — CT NM PET TUM IMG INITIAL (PI) SKULL BASE T - THIGH
8 series · 25 of 25 positions shown · non-contrast
Comparison: Multiple exams, including 03/30/2017 CT chest

CLINICAL DATA: Initial treatment strategy for non-small cell lung
cancer in the left upper lobe

EXAM:
NUCLEAR MEDICINE PET SKULL BASE TO THIGH
TECHNIQUE: 6.6 mCi F-18 FDG was injected intravenously. Full-ring PET imaging
was performed from the skull base to thigh after the radiotracer. CT
data was obtained and used for attenuation correction and anatomic
localization.
FASTING BLOOD GLUCOSE:  Value: 110 mg/dl

[Series 3: pet sk_thigh ac · axial · 5.0mm · 4.07mm/px · z∈[-1296,-496]mm · 4 of 201 slices shown]
[im 1/201]
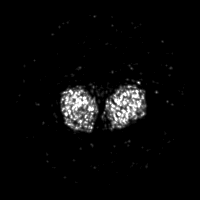
[im 67/201]
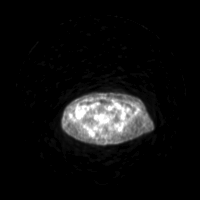
[im 134/201]
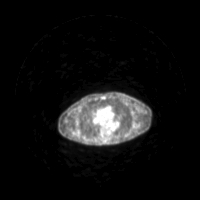
[im 201/201]
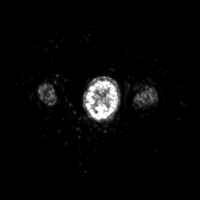

[Series 4: ct sk_thigh 5.0 b31f · axial · 5.0mm · 0.98mm/px · z∈[-1296,-496]mm · 5 of 201 slices shown]
[im 1/201]
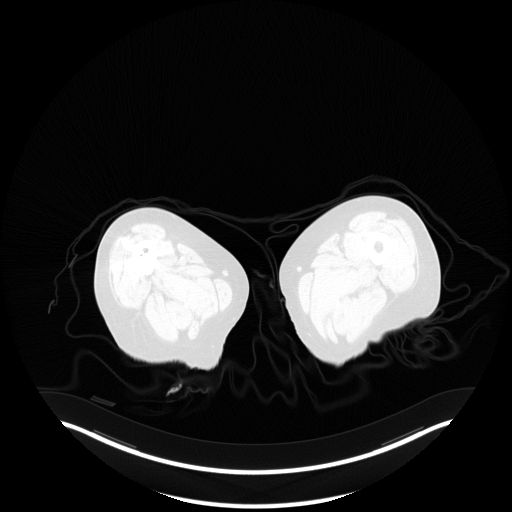
[im 51/201]
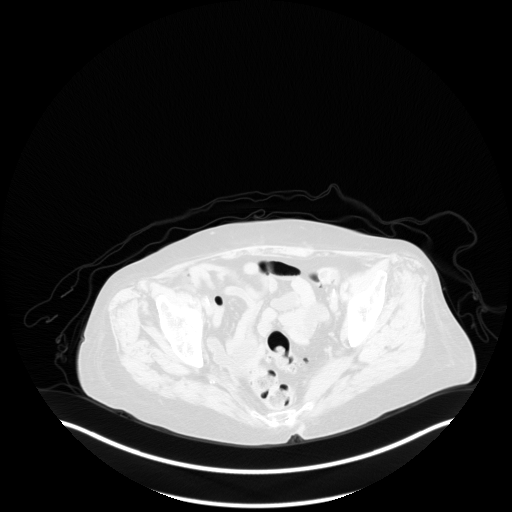
[im 101/201]
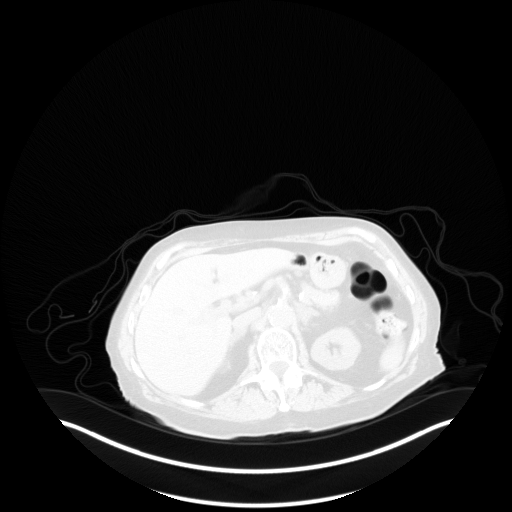
[im 151/201]
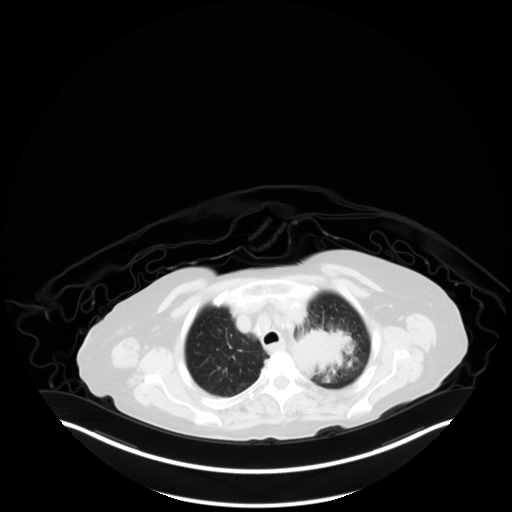
[im 201/201  brain]
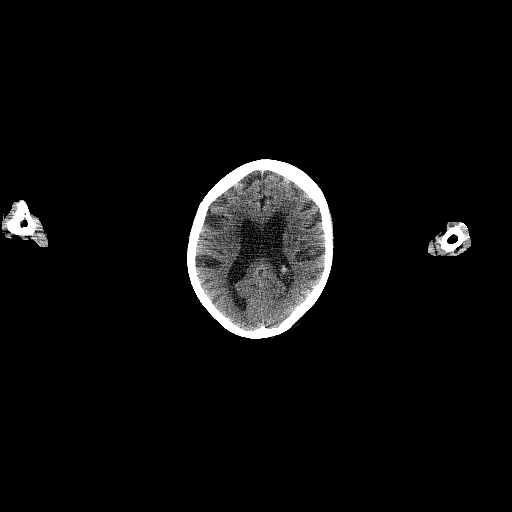

[Series 5: pet sk_thigh nac · axial · 5.0mm · 4.07mm/px · z∈[-1296,-496]mm · 5 of 201 slices shown]
[im 1/201]
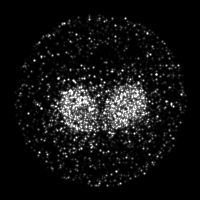
[im 51/201]
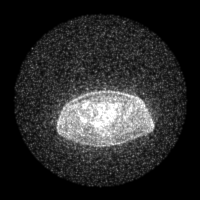
[im 101/201]
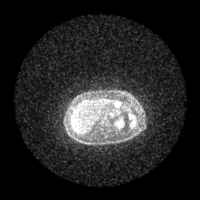
[im 151/201]
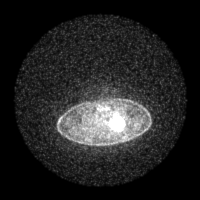
[im 201/201]
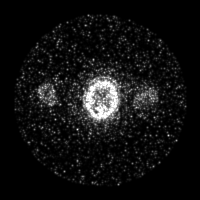

[Series 8: ct sk_thigh 5.0 b70f (id)_bone · axial · 5.0mm · 0.69mm/px · z∈[-898,-634]mm · 2 of 67 slices shown]
[im 1/67  bone]
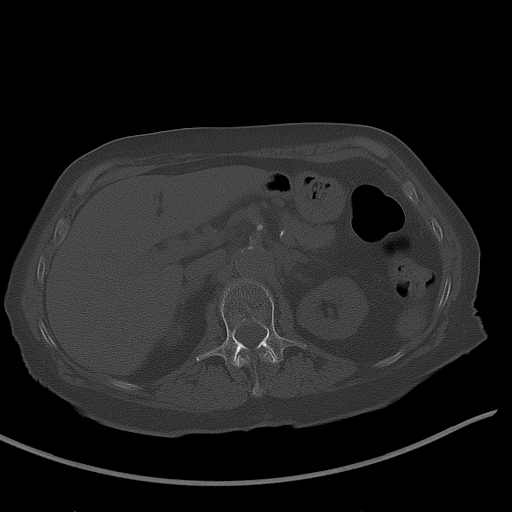
[im 67/67  bone]
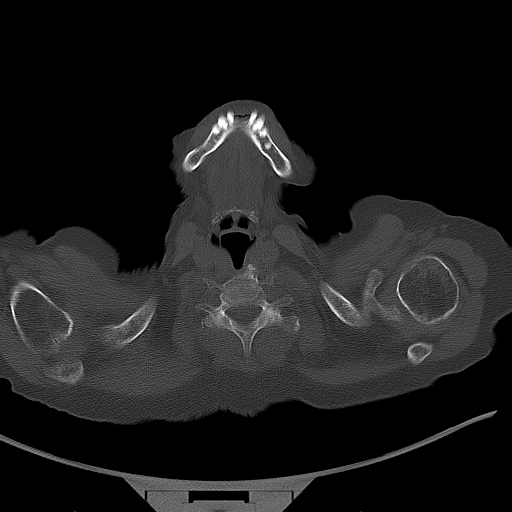

[Series 603: range-ct sk_thigh 5.0 (id)<alpha range> · 2 of 83 slices shown (1 of 2)]
[im 1/83]
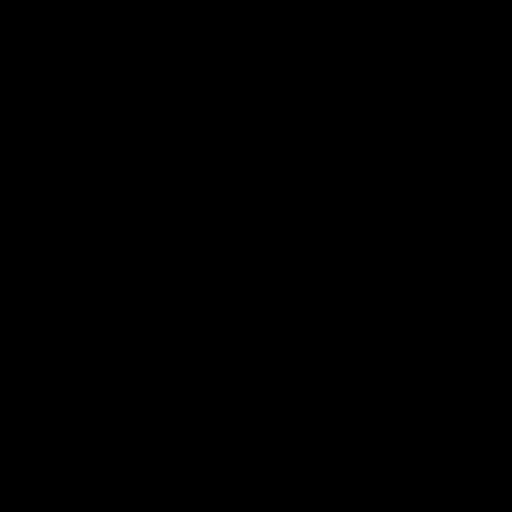
[im 83/83]
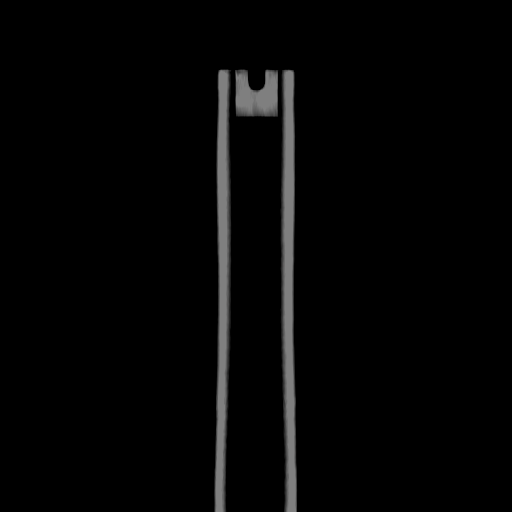

[Series 604: mip range · coronal · 1.68mm/px · 1 of 32 slices shown]
[im 1/32]
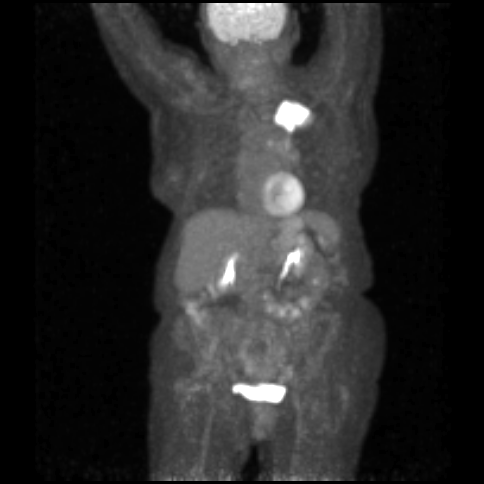

[Series 605: range-ct sk_thigh 5.0 (id)<alpha range> · 5 of 195 slices shown (2 of 2)]
[im 1/195]
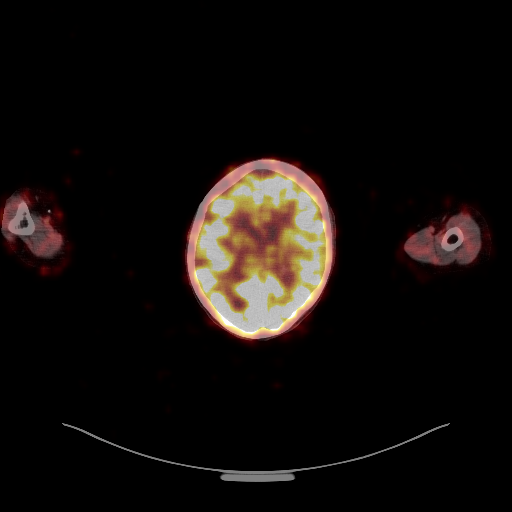
[im 49/195]
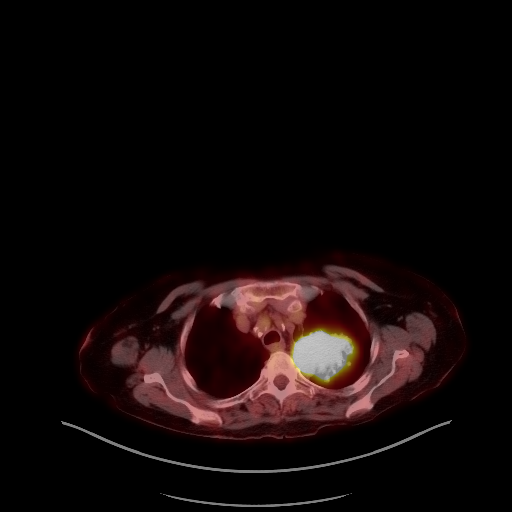
[im 98/195]
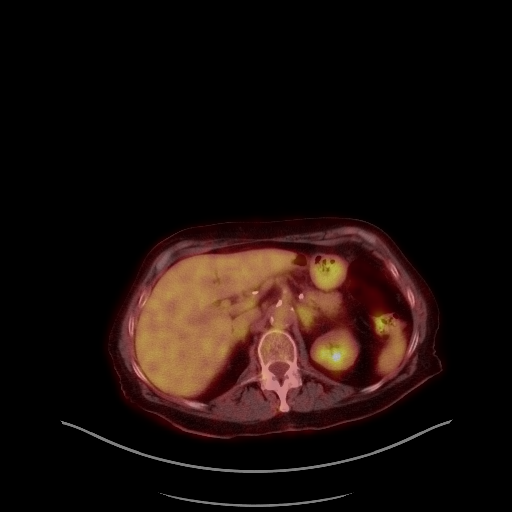
[im 146/195]
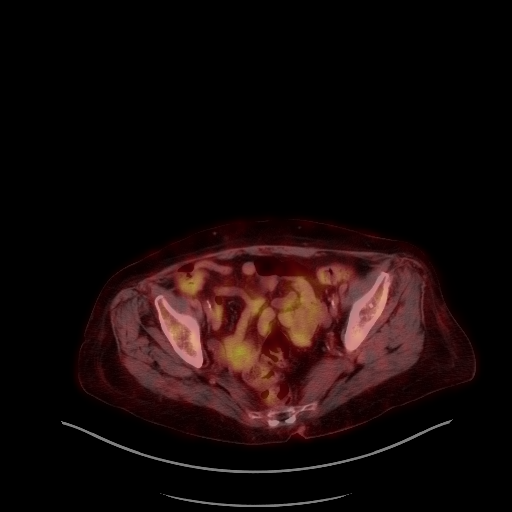
[im 195/195]
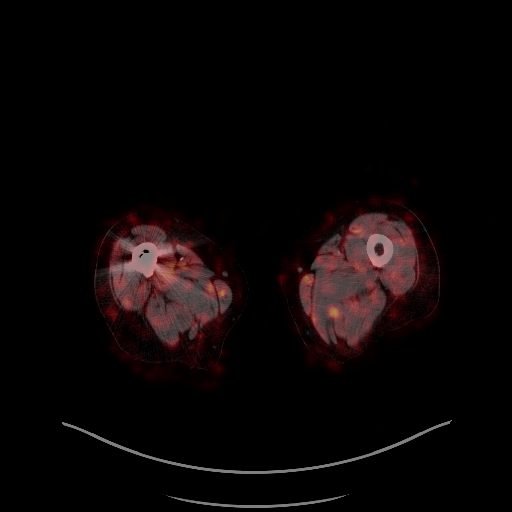

[Series 1032: results mm oncology reading · 5.0mm · 0.60mm/px · 1 of 3 slices shown]
[im 1/3]
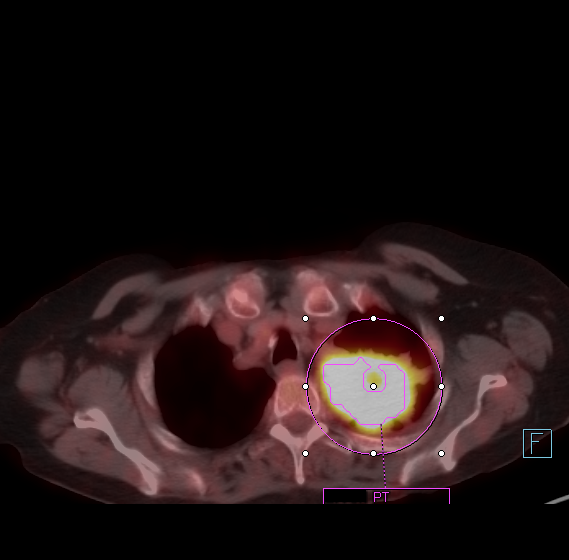

[25 of 25 positions shown; findings below may reference images not displayed]

FINDINGS: NECK

No hypermetabolic lymph nodes in the neck.

CHEST

[DATE] by 6.0 cm left apical Pancoast tumor with irregular margins,
maximum SUV 24.3 possible mediastinal involvement abutting an up to
180 degree arc of the left subclavian artery. The mass also abuts
the margin of the aortic arch over an 83 degree marginal arc and
cm of longitudinal contact. Questionable involvement of the left
paraspinal soft tissues as on the original CT, but without vertebral
or definite rib invasion at this time.

An AP window lymph node measures 1.1 cm in short axis on image 60/4
and has a maximum SUV of 4.2.

Left suprahilar adenopathy measured 1.2 cm on the 03/30/2017 CT
scan, and has low-level metabolic activity with maximum SUV 4.2.

Background mediastinal blood pool activity 2.2.

ABDOMEN/PELVIS

Low-density fullness of the left adrenal gland, possibly from a
small adrenal adenoma, 7 Hounsfield units, without significant
abnormal hypermetabolic activity.

The liver, spleen, and pancreas demonstrate no abnormal activity. No
hypermetabolic adenopathy in the abdomen or pelvis.

Aortoiliac atherosclerotic vascular disease. Sigmoid colon
diverticulosis.

SKELETON

No focal hypermetabolic activity to suggest skeletal metastasis.

Right hip hemiarthroplasty. The mild sclerosis along both sacroiliac
joints. Degenerative facet arthropathy in the lower lumbar spine.
IMPRESSION: 1. 7.5 cm left apical Pancoast tumor contacts the margins of the
left subclavian artery and the aortic arch, and is highly
hypermetabolic with maximum SUV 24.3. There may be some early in
extension into the left paraspinal adipose tissue but no
vertebral/bony invasion.
2. Mildly enlarged left suprahilar and AP window lymph nodes each
have a maximum SUV of 4.2, which is enough above background
mediastinal blood pool activity that these nodes are probably
metastatic.
3. No findings of metastatic disease to the neck, abdomen/pelvis, or
skeleton.
4. Other imaging findings of potential clinical significance:
Possible small left adrenal adenoma. Aortic Atherosclerosis
([id number]). Sigmoid colon diverticulosis. Mild chronic bilateral
sacroiliitis. Facet arthropathy in the lower lumbar spine. Right hip
hemiarthroplasty.

## 2019-04-04 ENCOUNTER — Ambulatory Visit (HOSPITAL_COMMUNITY)
Admission: RE | Admit: 2019-04-04 | Discharge: 2019-04-04 | Disposition: A | Discharge status: Home / Self Care | Payer: Medicare Other | Source: Ambulatory Visit | Attending: Internal Medicine | Admitting: Internal Medicine

## 2019-04-04 ENCOUNTER — Other Ambulatory Visit: Payer: Self-pay

## 2019-04-04 ENCOUNTER — Encounter (HOSPITAL_COMMUNITY): Payer: Self-pay

## 2019-04-04 ENCOUNTER — Inpatient Hospital Stay: Payer: Medicare Other | Attending: Internal Medicine

## 2019-04-04 DIAGNOSIS — Z8673 Personal history of transient ischemic attack (TIA), and cerebral infarction without residual deficits: Secondary | ICD-10-CM | POA: Diagnosis not present

## 2019-04-04 DIAGNOSIS — Z9221 Personal history of antineoplastic chemotherapy: Secondary | ICD-10-CM | POA: Insufficient documentation

## 2019-04-04 DIAGNOSIS — R1310 Dysphagia, unspecified: Secondary | ICD-10-CM | POA: Diagnosis not present

## 2019-04-04 DIAGNOSIS — E785 Hyperlipidemia, unspecified: Secondary | ICD-10-CM | POA: Insufficient documentation

## 2019-04-04 DIAGNOSIS — Z7982 Long term (current) use of aspirin: Secondary | ICD-10-CM | POA: Diagnosis not present

## 2019-04-04 DIAGNOSIS — C3412 Malignant neoplasm of upper lobe, left bronchus or lung: Secondary | ICD-10-CM | POA: Diagnosis present

## 2019-04-04 DIAGNOSIS — C3490 Malignant neoplasm of unspecified part of unspecified bronchus or lung: Secondary | ICD-10-CM | POA: Diagnosis present

## 2019-04-04 DIAGNOSIS — Z923 Personal history of irradiation: Secondary | ICD-10-CM | POA: Insufficient documentation

## 2019-04-04 DIAGNOSIS — I252 Old myocardial infarction: Secondary | ICD-10-CM | POA: Insufficient documentation

## 2019-04-04 DIAGNOSIS — K208 Other esophagitis without bleeding: Secondary | ICD-10-CM | POA: Insufficient documentation

## 2019-04-04 DIAGNOSIS — Z79899 Other long term (current) drug therapy: Secondary | ICD-10-CM | POA: Insufficient documentation

## 2019-04-04 DIAGNOSIS — Z90710 Acquired absence of both cervix and uterus: Secondary | ICD-10-CM | POA: Insufficient documentation

## 2019-04-04 LAB — CBC WITH DIFFERENTIAL (CANCER CENTER ONLY)
Abs Immature Granulocytes: 0.01 10*3/uL (ref 0.00–0.07)
Basophils Absolute: 0.1 10*3/uL (ref 0.0–0.1)
Basophils Relative: 1 %
Eosinophils Absolute: 0.2 10*3/uL (ref 0.0–0.5)
Eosinophils Relative: 4 %
HCT: 36 % (ref 36.0–46.0)
Hemoglobin: 12.1 g/dL (ref 12.0–15.0)
Immature Granulocytes: 0 %
Lymphocytes Relative: 18 %
Lymphs Abs: 0.9 10*3/uL (ref 0.7–4.0)
MCH: 31.1 pg (ref 26.0–34.0)
MCHC: 33.6 g/dL (ref 30.0–36.0)
MCV: 92.5 fL (ref 80.0–100.0)
Monocytes Absolute: 0.4 10*3/uL (ref 0.1–1.0)
Monocytes Relative: 8 %
Neutro Abs: 3.4 10*3/uL (ref 1.7–7.7)
Neutrophils Relative %: 69 %
Platelet Count: 166 10*3/uL (ref 150–400)
RBC: 3.89 MIL/uL (ref 3.87–5.11)
RDW: 13.3 % (ref 11.5–15.5)
WBC Count: 5 10*3/uL (ref 4.0–10.5)
nRBC: 0 % (ref 0.0–0.2)

## 2019-04-04 LAB — CMP (CANCER CENTER ONLY)
ALT: 13 U/L (ref 0–44)
AST: 22 U/L (ref 15–41)
Albumin: 3.7 g/dL (ref 3.5–5.0)
Alkaline Phosphatase: 107 U/L (ref 38–126)
Anion gap: 11 (ref 5–15)
BUN: 19 mg/dL (ref 8–23)
CO2: 22 mmol/L (ref 22–32)
Calcium: 9.1 mg/dL (ref 8.9–10.3)
Chloride: 100 mmol/L (ref 98–111)
Creatinine: 0.87 mg/dL (ref 0.44–1.00)
GFR, Est AFR Am: >60 mL/min (ref >60)
GFR, Estimated: 59 mL/min — ABNORMAL LOW (ref >60)
Glucose, Bld: 97 mg/dL (ref 70–99)
Potassium: 4.4 mmol/L (ref 3.5–5.1)
Sodium: 133 mmol/L — ABNORMAL LOW (ref 135–145)
Total Bilirubin: 0.5 mg/dL (ref 0.3–1.2)
Total Protein: 7.1 g/dL (ref 6.5–8.1)

## 2019-04-04 MED ORDER — IOHEXOL 300 MG/ML  SOLN
75.0000 mL | Freq: Once | INTRAMUSCULAR | Status: AC | PRN
  Administered 2019-04-04: 75 mL via INTRAVENOUS

## 2019-04-04 MED ORDER — SODIUM CHLORIDE (PF) 0.9 % IJ SOLN
INTRAMUSCULAR | Status: AC
  Filled 2019-04-04: qty 50

## 2019-04-08 ENCOUNTER — Inpatient Hospital Stay (HOSPITAL_BASED_OUTPATIENT_CLINIC_OR_DEPARTMENT_OTHER): Payer: Medicare Other | Admitting: Internal Medicine

## 2019-04-08 ENCOUNTER — Other Ambulatory Visit: Payer: Self-pay

## 2019-04-08 ENCOUNTER — Telehealth: Payer: Self-pay | Admitting: Internal Medicine

## 2019-04-08 ENCOUNTER — Encounter: Payer: Self-pay | Admitting: Internal Medicine

## 2019-04-08 VITALS — BP 142/75 | HR 70 | Temp 98.2°F | Resp 16 | Ht 60.5 in | Wt 103.4 lb

## 2019-04-08 DIAGNOSIS — C3490 Malignant neoplasm of unspecified part of unspecified bronchus or lung: Secondary | ICD-10-CM | POA: Diagnosis not present

## 2019-04-08 DIAGNOSIS — C3412 Malignant neoplasm of upper lobe, left bronchus or lung: Secondary | ICD-10-CM

## 2019-04-08 DIAGNOSIS — D649 Anemia, unspecified: Secondary | ICD-10-CM

## 2019-04-08 NOTE — Telephone Encounter (Signed)
Scheduled appt per 11/17 los. ° °Printed calendar and avs. °

## 2019-04-08 NOTE — Progress Notes (Signed)
[place] [phone]   Fax:([phone]  OFFICE PROGRESS NOTE  Leonard Downing, MD [place] [address]  DIAGNOSIS: Stage IIIA (T2b, N2, M0) non-small cell lung cancer, squamous cell carcinoma presented with left upper lobe lung mass left hilar and mediastinal lymphadenopathy diagnosed in November 2018. PDL 1 less than 1%.  PRIOR THERAPY: Concurrent chemoradiation with weekly carboplatin for AUC of 2 and paclitaxel 45 mg/M2.  First dose May 21, 2017, status post 5 cycles.  Last dose was given on June 18, 2017.  CURRENT THERAPY: Observation.  INTERVAL HISTORY: [name] 85 y.o. female returns to the clinic today for 6 months follow-up visit.  The patient is feeling fine today with no concerning complaints except for some aching pain in the lower extremities.  She denied having any chest pain, shortness of breath, cough or hemoptysis.  She has no nausea, vomiting, diarrhea or constipation.  She denied having any headache or visual changes.  She had repeat CT scan of the chest performed recently and she is here for evaluation and discussion of her scan results.   MEDICAL HISTORY: Past Medical History:  Diagnosis Date  . Abdominal aortic atherosclerosis ([place])   . CAD (coronary artery disease)   . Cancer ([place])   . Carotid artery occlusion   . Common bile duct dilation   . Gallstones   . Headache(784.0)   . Hiatal hernia   . HLD (hyperlipidemia)   . Myocardial infarction ([place])   . PONV (postoperative nausea and vomiting)   . Stroke [facility]) 2003    ALLERGIES:  is allergic to dextroamphetamine sulfate er.  MEDICATIONS:  Current Outpatient Medications  Medication Sig Dispense Refill  . ALPRAZolam (XANAX) 0.25 MG tablet Take 0.5 tablets (0.125 mg total) by mouth 2 (two) times daily as needed for anxiety. 10 tablet 0  . aspirin 81 MG EC tablet Take 81 mg by mouth daily.      . busPIRone (BUSPAR) 10 MG  tablet Take 10 mg by mouth daily as needed (anxiety).     . Cholecalciferol (VITAMIN D3) 75 MCG (3000 UT) TABS Take by mouth.    Marland Kitchen lisinopril (ZESTRIL) 20 MG tablet     . Multiple Vitamins-Minerals (MULTIVITAMIN ADULT EXTRA C) CHEW Chew by mouth.    . polyethylene glycol (MIRALAX / GLYCOLAX) packet Take 17 g daily as needed by mouth. (Patient not taking: Reported on 10/03/2018) 14 each 0  . potassium chloride 20 MEQ TBCR Take 40 mEq by mouth daily for 5 days. 10 tablet 0  . protein supplement shake (PREMIER PROTEIN) LIQD Take 2 oz by mouth daily with breakfast.     No current facility-administered medications for this visit.     SURGICAL HISTORY:  Past Surgical History:  Procedure Laterality Date  . ABDOMINAL HYSTERECTOMY    . ANKLE FRACTURE SURGERY    . APPENDECTOMY    . BLADDER SURGERY    . CHOLECYSTECTOMY N/A 05/05/2013   Procedure: LAPAROSCOPIC CHOLECYSTECTOMY WITH INTRAOPERATIVE CHOLANGIOGRAM;  Surgeon: Ralene Ok, MD;  Location: [place];  Service: General;  Laterality: N/A;  . HIP ARTHROPLASTY Right 01/10/2014   Procedure: RIGHT HIP HEMIARTHROPLASTY;  Surgeon: Johnn Hai, MD;  Location: [facility];  Service: Orthopedics;  Laterality: Right;  DEPUY AML LATERAL WITH MARK II  . ROTATOR CUFF REPAIR Left   . TONSILLECTOMY      REVIEW OF SYSTEMS:  A comprehensive review of systems was negative except for:  Constitutional: positive for fatigue   PHYSICAL EXAMINATION: General appearance: alert, cooperative and no distress Head: Normocephalic, without obvious abnormality, atraumatic Neck: no adenopathy, no JVD, supple, symmetrical, trachea midline and thyroid not enlarged, symmetric, no tenderness/mass/nodules Lymph nodes: Cervical, supraclavicular, and axillary nodes normal. Resp: clear to auscultation bilaterally Back: symmetric, no curvature. ROM normal. No CVA tenderness. Cardio: regular rate and rhythm, S1, S2 normal, no murmur, click, rub or gallop GI: soft, non-tender; bowel  sounds normal; no masses,  no organomegaly Extremities: extremities normal, atraumatic, no cyanosis or edema    ECOG PERFORMANCE STATUS: 1 - Symptomatic but completely ambulatory  Blood pressure (!) 142/75, pulse 70, temperature 98.2 F (36.8 C), temperature source Temporal, resp. rate 16, height 5' 0.5" (1.537 m), weight 103 lb 6.4 oz (46.9 kg), SpO2 99 %.  LABORATORY DATA: Lab Results  Component Value Date   WBC 5.0 04/04/2019   HGB 12.1 04/04/2019   HCT 36.0 04/04/2019   MCV 92.5 04/04/2019   PLT 166 04/04/2019      Chemistry      Component Value Date/Time   NA 133 (L) 04/04/2019 1028   NA 135 (L) 05/21/2017 0829   K 4.4 04/04/2019 1028   K 3.6 05/21/2017 0829   CL 100 04/04/2019 1028   CO2 22 04/04/2019 1028   CO2 27 05/21/2017 0829   BUN 19 04/04/2019 1028   BUN 22.6 05/21/2017 0829   CREATININE 0.87 04/04/2019 1028   CREATININE 1.1 05/21/2017 0829      Component Value Date/Time   CALCIUM 9.1 04/04/2019 1028   CALCIUM 11.3 (H) 05/21/2017 0829   ALKPHOS 107 04/04/2019 1028   ALKPHOS 143 05/21/2017 0829   AST 22 04/04/2019 1028   AST 14 05/21/2017 0829   ALT 13 04/04/2019 1028   ALT 15 05/21/2017 0829   BILITOT 0.5 04/04/2019 1028   BILITOT 0.38 05/21/2017 0829       RADIOGRAPHIC STUDIES: Ct Chest W Contrast  Result Date: 04/04/2019 CLINICAL DATA:  Lung cancer EXAM: CT CHEST WITH CONTRAST TECHNIQUE: Multidetector CT imaging of the chest was performed during intravenous contrast administration. CONTRAST:  69mL OMNIPAQUE IOHEXOL 300 MG/ML  SOLN COMPARISON:  10/01/2018, 04/26/2018 FINDINGS: Cardiovascular: Unusually severe, irregular mixed calcific atherosclerosis of the thoracic aorta. Normal heart size. Three-vessel coronary artery calcifications. No pericardial effusion. Mediastinum/Nodes: No discretely enlarged mediastinal, hilar, or axillary lymph nodes. Unchanged post treatment appearance of soft tissue about the left hilum. Thyroid gland, trachea, and  esophagus demonstrate no significant findings. Lungs/Pleura: Unchanged post treatment fibrotic scarring and volume loss of the perihilar left lung and left apex, with minimal paramedian scarring of the right lung. Numerous redemonstrated very fine centrilobular nodules throughout the bilateral upper lobes. A previously seen more discrete 4 mm pulmonary nodule of the right upper lobe is almost completely resolved, consistent with resolution of nonspecific infection or inflammation. No pleural effusion or pneumothorax. Upper Abdomen: No acute abnormality. Stable left adrenal nodule, not previously FDG avid. Musculoskeletal: No chest wall mass or suspicious bone lesions identified. Unchanged high-grade wedge deformity of T11. IMPRESSION: 1. Unchanged post treatment fibrotic scarring and volume loss of the perihilar left lung and left apex, with minimal paramedian scarring of the right lung. Unchanged post treatment appearance of soft tissue about the left hilum. No evidence of recurrent malignancy. 2. Numerous redemonstrated very fine centrilobular nodules throughout the bilateral upper lobes, consistent with nonspecific infection or inflammation, likely smoking-related respiratory bronchiolitis. 3. A previously seen more discrete 4 mm pulmonary nodule of the  right upper lobe is almost completely resolved, consistent with resolution of nonspecific infection or inflammation. 4.  Coronary artery disease. 5. Unusually severe, irregular mixed calcific atherosclerosis of the thoracic aorta. Aortic Atherosclerosis (ICD10-I70.0). Electronically Signed   By: Eddie Candle M.D.   On: 04/04/2019 13:27    ASSESSMENT AND PLAN: This is a very pleasant 85 years old white female with a stage IIIa non-small cell lung cancer, squamous cell carcinoma.  She she completed a course of concurrent chemoradiation with weekly carboplatin and paclitaxel status post 5 cycles. She has been tolerating this treatment well except for the  significant odynophagia secondary to radiation induced esophagitis. The patient has partial response to this treatment. The patient has been on observation for the last 2 years and she is feeling fine with no concerning complaints. She had repeat CT scan of the chest performed recently.  I personally and independently reviewed the scans and discussed the results with the patient today. Her scan showed no concerning findings for disease recurrence or progression. I recommended for her to continue on observation with repeat CT scan of the chest in 6 months. She was advised to call immediately if she has any concerning symptoms in the interval. The patient voices understanding of current disease status and treatment options and is in agreement with the current care plan. All questions were answered. The patient knows to call the clinic with any problems, questions or concerns. We can certainly see the patient much sooner if necessary.  I spent 10 minutes counseling the patient face to face. The total time spent in the appointment was15 minutes.  Disclaimer: This note was dictated with voice recognition software. Similar sounding words can inadvertently be transcribed and may not be corrected upon review.

## 2019-04-09 ENCOUNTER — Ambulatory Visit (INDEPENDENT_AMBULATORY_CARE_PROVIDER_SITE_OTHER): Payer: Medicare Other

## 2019-04-09 ENCOUNTER — Ambulatory Visit: Payer: Medicare Other | Admitting: Podiatry

## 2019-04-09 DIAGNOSIS — M79676 Pain in unspecified toe(s): Secondary | ICD-10-CM | POA: Diagnosis not present

## 2019-04-09 DIAGNOSIS — M2041 Other hammer toe(s) (acquired), right foot: Secondary | ICD-10-CM

## 2019-04-09 DIAGNOSIS — M2042 Other hammer toe(s) (acquired), left foot: Secondary | ICD-10-CM

## 2019-04-09 DIAGNOSIS — B351 Tinea unguium: Secondary | ICD-10-CM | POA: Diagnosis not present

## 2019-04-10 ENCOUNTER — Encounter: Payer: Self-pay | Admitting: Podiatry

## 2019-04-10 NOTE — Progress Notes (Signed)
  Subjective:  Patient ID: Loyce Dys, female    DOB: 17-Mar-1929,  MRN: [id number]  Chief Complaint  Patient presents with  . Foot Pain    pt is here for a possible hammer toe of the right foot, pt also states that she is here for a foot pain   85 y.o. female returns for the above complaint. Patient presents with painful toenails that hurts when she is ambulating.  She states that she also has painful hammertoe contractures that also hurts as well.  She has not done anything to help with the pain.  However she states that she feels better once they are debrided.  She also states that she would like another toe spacer to help alleviate the pressure.  She has been wearing regular sneakers.  She denies any other acute complaints.  Objective:  There were no vitals filed for this visit. Podiatric Exam: Vascular: dorsalis pedis and posterior tibial pulses are palpable bilateral. Capillary return is immediate. Temperature gradient is WNL. Skin turgor WNL  Sensorium: Normal Semmes Weinstein monofilament test. Normal tactile sensation bilaterally. Nail Exam: Pt has thick disfigured discolored nails with subungual debris noted bilateral entire nail hallux through fifth toenails Ulcer Exam: There is no evidence of ulcer or pre-ulcerative changes or infection. Orthopedic Exam: Muscle tone and strength are WNL. No limitations in general ROM. No crepitus or effusions noted. HAV  B/L.  Hammer toes 2-5  B/L. Skin: No Porokeratosis. No infection or ulcers  Assessment & Plan:  Patient was evaluated and treated and all questions answered.  Onychomycosis with pain  -Nails palliatively debrided as below. -Educated on self-care  Hammertoe contractures -I explained the etiology and various treatment options available with hammertoe contractures including conservative care as well as surgical care.  Patient would prefer to stick with conservative care at this time. -2 toe protectors were dispensed to help  alleviate the pressure from the contractures.  Procedure: Nail Debridement Rationale: pain  Type of Debridement: manual, sharp debridement. Instrumentation: Nail nipper, rotary burr. Number of Nails: 10  Procedures and Treatment: Consent by patient was obtained for treatment procedures. The patient understood the discussion of treatment and procedures well. All questions were answered thoroughly reviewed. Debridement of mycotic and hypertrophic toenails, 1 through 5 bilateral and clearing of subungual debris. No ulceration, no infection noted.  Return Visit-Office Procedure: Patient instructed to return to the office for a follow up visit 3 months for continued evaluation and treatment.  Boneta Lucks, DPM    No follow-ups on file.

## 2019-06-10 ENCOUNTER — Encounter: Payer: Self-pay | Admitting: Podiatry

## 2019-06-10 ENCOUNTER — Ambulatory Visit: Payer: Medicare Other | Admitting: Podiatry

## 2019-06-10 ENCOUNTER — Other Ambulatory Visit: Payer: Self-pay

## 2019-06-10 DIAGNOSIS — M2041 Other hammer toe(s) (acquired), right foot: Secondary | ICD-10-CM | POA: Diagnosis not present

## 2019-06-10 DIAGNOSIS — M79676 Pain in unspecified toe(s): Secondary | ICD-10-CM | POA: Diagnosis not present

## 2019-06-10 DIAGNOSIS — B351 Tinea unguium: Secondary | ICD-10-CM

## 2019-06-10 DIAGNOSIS — M2042 Other hammer toe(s) (acquired), left foot: Secondary | ICD-10-CM

## 2019-06-11 NOTE — Progress Notes (Signed)
She presents today chief complaint of painful toenails bilateral.  Objective: Vital signs are stable she is alert and oriented x3.  Pulses are barely palpable.  No open lesions or wounds that she does have cyanosis to the toes without severe tenderness.  She does have some neuropathy.  Assessment: Peripheral vascular disease possibly associated with smoking such as Buerger's disease also has neuropathy.  Plan: Debrided a few of the toenails today that were were in danger of growing in.  Follow-up with her as needed

## 2019-07-09 ENCOUNTER — Ambulatory Visit: Payer: Medicare Other | Admitting: Podiatry

## 2019-09-30 ENCOUNTER — Ambulatory Visit: Payer: Medicare Other | Admitting: Neurology

## 2019-09-30 ENCOUNTER — Other Ambulatory Visit: Payer: Self-pay

## 2019-09-30 ENCOUNTER — Encounter: Payer: Self-pay | Admitting: Neurology

## 2019-09-30 VITALS — BP 148/72 | HR 71 | Temp 97.5°F | Ht 61.25 in | Wt 98.0 lb

## 2019-09-30 DIAGNOSIS — G620 Drug-induced polyneuropathy: Secondary | ICD-10-CM

## 2019-09-30 NOTE — Progress Notes (Signed)
[facility]    Provider:  Dr Jaynee Eagles Requesting Provider: Leonard Downing, * Primary Care Provider:  Leonard Downing, MD  CC:  Numbness of feet  HPI:  [name] is a 86 y.o. female here as requested by Leonard Downing, * for neuropathy. PMHx ung cancer s/p radiation and chemotherapy (Carboplatin and Paclitaxel last dose 05/2017), CVA, CAD. HTN, anxiety, HLD, tobacco abuse.   I reviewed Dr. Arelia Sneddon notes, stating patient reported she has "sleepy feet", she fell, gabapentin did not help, cannot get up because of falls, muscles are some better, she was on gabapentin 1 to 3 tablets every 8 hours and she was taking 1 3 times daily, also feels she does not have control of her feet, "woozy", dizzy, he was not sure she could tolerate the gabapentin, at a later date about a week later May 13, 2019 she mentioned her feet were no better, late in the day she gets depressed, also muscular pain in the back and abdomen, notes from July 25, 2019 said she fell Thursday on titanium hip did not get evaluated and asked for home health she was given doxy for a cough.  She tried to get up and the left foot was asleep and she fell. She has swelling in the feet. Both feet. Her feet are numb. They feel like when her arm goes to sleep. Feet are always cold. She has had the numbness at least a year maybe 2 years. She also has numbness in the hands and started after chemotherapy a few years ago. She is clumsier, hands are weaker, left hand is worse than the right one, feet are symmetrical. Her feet are worsening as well as her balance. She is off balance and she fell due to numbness of the left foot. Mostly numbness, no pain. No other focal neurologic deficits, associated symptoms, inciting events or modifiable factors.  Reviewed notes, labs and imaging from outside physicians, which showed:  Reviewed CT head images 04/2017 and agree with the following:  Brain: Ventricles and  cisterns are normal. Remaining CSF spaces are unremarkable. There is moderate chronic ischemic microvascular disease. There is a small old left frontal infarct. There is no mass, mass effect, shift of midline structures or acute hemorrhage. No evidence of acute infarction.  Vascular: No hyperdense vessel or unexpected calcification.  Skull: Normal. Negative for fracture or focal lesion.  Sinuses/Orbits: No acute finding.  Other: None.  IMPRESSION: No acute intracranial findings.  Moderate chronic ischemic microvascular disease. Small old left frontal infarct.  CK 2018 19, TSH 2018 normal, last CBC/CMP 03/2019 unremarkable (slightly decreases Na and GFR)  Review of Systems: Patient complains of symptoms per HPI as well as the following symptoms:imbalance, fall, dysphagia, numbness. Pertinent negatives and positives per HPI. All others negative.   Social History   Socioeconomic History  . Marital status: Widowed    Spouse name: Not on file  . Number of children: 3  . Years of education: Not on file  . Highest education level: Not on file  Occupational History  . Occupation: Retired  Tobacco Use  . Smoking status: Current Every Day Smoker    Packs/day: 0.50    Types: Cigarettes  . Smokeless tobacco: Never Used  Substance and Sexual Activity  . Alcohol use: Not Currently  . Drug use: No  . Sexual activity: Not on file  Other Topics Concern  . Not on file  Social History Narrative   The patient is widowed with 3  adult sons   Retired   2-3 cups of caffeine daily   Originally from [place]   03/20/2013      Lives alone   Right handed   Social Determinants of Health   Financial Resource Strain:   . Difficulty of Paying Living Expenses:   Food Insecurity:   . Worried About Charity fundraiser in the Last Year:   . Arboriculturist in the Last Year:   Transportation Needs:   . Film/video editor (Medical):   Marland Kitchen Lack of Transportation (Non-Medical):     Physical Activity:   . Days of Exercise per Week:   . Minutes of Exercise per Session:   Stress:   . Feeling of Stress :   Social Connections:   . Frequency of Communication with Friends and Family:   . Frequency of Social Gatherings with Friends and Family:   . Attends Religious Services:   . Active Member of Clubs or Organizations:   . Attends Archivist Meetings:   Marland Kitchen Marital Status:   Intimate Partner Violence:   . Fear of Current or Ex-Partner:   . Emotionally Abused:   Marland Kitchen Physically Abused:   . Sexually Abused:     Family History  Problem Relation Age of Onset  . Stroke Mother   . Breast cancer Maternal Aunt   . Prostate cancer Maternal Grandfather   . Neuropathy Neg Hx     Past Medical History:  Diagnosis Date  . Abdominal aortic atherosclerosis ([place])   . CAD (coronary artery disease)   . Cancer ([place])   . Carotid artery occlusion   . Common bile duct dilation   . Gallstones   . Headache(784.0)   . Hiatal hernia   . HLD (hyperlipidemia)   . Myocardial infarction ([place])   . PONV (postoperative nausea and vomiting)   . Stroke [facility]) 2003    Patient Active Problem List   Diagnosis Date Noted  . Drug-induced polyneuropathy ([place]) 09/30/2019  . Sensorineural hearing loss (SNHL), bilateral 02/13/2018  . Malnutrition of moderate degree 01/08/2018  . SBO (small bowel obstruction) ([place]) 01/06/2018  . Community acquired pneumonia   . Hypercalcemia 05/21/2017  . MDD (major depressive disorder), recurrent episode, moderate ([place]) 05/11/2017  . Dehydration 05/10/2017  . Weakness 05/10/2017  . Encounter for antineoplastic chemotherapy 05/04/2017  . Goals of care, counseling/discussion 05/04/2017  . Malignant neoplasm of bronchus of left upper lobe ([place]) 05/03/2017  . Chest congestion 04/03/2017  . Constipation 04/03/2017  . Adenoma of left adrenal gland 04/03/2017  . SIADH (syndrome of inappropriate ADH production) ([place]) 04/02/2017  . Lung mass 04/02/2017   . Elevated AST (SGOT) 04/02/2017  . Leukocytosis 04/02/2017  . Normocytic anemia 04/02/2017  . Cerumen impaction 11/19/2015  . Hip fracture requiring operative repair ([place]) 01/09/2014  . Closed right hip fracture ([place]) 01/09/2014  . Hyponatremia 01/09/2014  . Symptomatic cholelithiasis 03/20/2013  . Dilated bile duct 03/20/2013  . Abdominal aortic atherosclerosis ([place])   . Hypertension 10/19/2011  . Tobacco abuse 02/18/2009  . HLD (hyperlipidemia) 02/17/2009  . ANXIETY DISORDER 02/17/2009  . CAD (coronary artery disease) 02/17/2009  . CAROTID ARTERY OCCLUSION 02/17/2009  . CVA 02/17/2009    Past Surgical History:  Procedure Laterality Date  . ABDOMINAL HYSTERECTOMY    . ANKLE FRACTURE SURGERY    . APPENDECTOMY    . BLADDER SURGERY    . CHOLECYSTECTOMY N/A 05/05/2013   Procedure: LAPAROSCOPIC CHOLECYSTECTOMY WITH INTRAOPERATIVE CHOLANGIOGRAM;  Surgeon: Ralene Ok, MD;  Location: [facility];  Service: General;  Laterality: N/A;  . HIP ARTHROPLASTY Right 01/10/2014   Procedure: RIGHT HIP HEMIARTHROPLASTY;  Surgeon: Johnn Hai, MD;  Location: [facility];  Service: Orthopedics;  Laterality: Right;  DEPUY AML LATERAL WITH MARK II  . ROTATOR CUFF REPAIR Left   . TONSILLECTOMY      Current Outpatient Medications  Medication Sig Dispense Refill  . ALPRAZolam (XANAX) 0.25 MG tablet Take 0.5 tablets (0.125 mg total) by mouth 2 (two) times daily as needed for anxiety. 10 tablet 0  . aspirin 81 MG EC tablet Take 81 mg by mouth daily.      . busPIRone (BUSPAR) 10 MG tablet Take 10 mg by mouth daily as needed (anxiety).     . Cholecalciferol (VITAMIN D3) 75 MCG (3000 UT) TABS Take by mouth.    . escitalopram (LEXAPRO) 10 MG tablet Take 10 mg by mouth daily.    Marland Kitchen lisinopril (ZESTRIL) 20 MG tablet     . Multiple Vitamins-Minerals (MULTIVITAMIN ADULT EXTRA C) CHEW Chew by mouth.    . pantoprazole (PROTONIX) 20 MG tablet Take 20 mg by mouth daily.    . polyethylene glycol (MIRALAX /  GLYCOLAX) packet Take 17 g daily as needed by mouth. 14 each 0  . protein supplement shake (PREMIER PROTEIN) LIQD Take 2 oz by mouth daily with breakfast.    . potassium chloride 20 MEQ TBCR Take 40 mEq by mouth daily for 5 days. 10 tablet 0   No current facility-administered medications for this visit.    Allergies as of 09/30/2019 - Review Complete 09/30/2019  Allergen Reaction Noted  . Dextroamphetamine sulfate er Other (See Comments) 01/03/2011    Vitals: BP (!) 148/72 (BP Location: Left Arm, Patient Position: Sitting)   Pulse 71   Temp (!) 97.5 F (36.4 C) Comment: friend 96.9 taken at front  Ht 5' 1.25" (1.556 m)   Wt 98 lb (44.5 kg)   BMI 18.37 kg/m  Last Weight:  Wt Readings from Last 1 Encounters:  09/30/19 98 lb (44.5 kg)   Last Height:   Ht Readings from Last 1 Encounters:  09/30/19 5' 1.25" (1.556 m)     Physical exam: Exam: Gen: NAD, conversant, frail, thin, pleasant and comversant CV: RRR, +SEM. No Carotid Bruits. No peripheral edema,  Feet cool to touch Eyes: Conjunctivae clear without exudates or hemorrhage MSK: rubor of feet, flat feet  Neuro: Detailed Neurologic Exam  Speech:    Speech is normal; fluent and spontaneous with normal comprehension.  Cognition:    The patient is oriented to person, place, and time;     recent and remote memory intact;     language fluent;     normal attention, concentration,     fund of knowledge Cranial Nerves:    The pupils are equal, round, and reactive to light. Attempted fundoscopy could not visualize due to small pupils.  Visual fields are full to finger confrontation. Extraocular movements are intact. Trigeminal sensation is intact and the muscles of mastication are normal. The face is symmetric. The palate elevates in the midline. Hearing intact. Voice is normal. Shoulder shrug is normal. The tongue has normal motion without fasciculations.   Coordination:    No dysmetria  Gait:    Uses a walker, erect  posture, normal stance and stride, slightly bradykinetic but not shuffling  Motor Observation: global muscle atrophy more pronounced in the FDIs distally,    No asymmetry, and no involuntary movements noted.  Tone:  decreased  Posture:     Posture is normal. normal erect    Strength: mild triceps weakness 4/5 bilterally. Hip flexion 4/5 left 4/5 right. Otherwise strength intact in the upper and lower limbs.      Sensation: intact to pin prick and vibration, decreased temp distally     Reflex Exam:  DTR's: absent lowers, 2+ biceps   Toes:    The toes are equiv bilaterally.   Clonus:    Clonus is absent.    Assessment/Plan: [age] year old with numbness in the feet, exam consistent with small-fiber neuropathy. Symptoms started after chemotherapy which is likely the cause however I did recommend a thorough evaluation for other causes of peripheral neuropathy that we could intervene on but she declined any further testing.   - We discussed causes of peripheral neuropathy in depth, her friend also provided much information. She is going to try a multi-B vitamin, otherwise she declines anything further.  - Given that she has numbness and not pain, gabapentin and other medications won't really help her.  - She does have distal rubor of the feet that are cool to touch and I recommended she discuss with Dr. Arelia Sneddon eval for PVD especially in light of her hx of tobacco abuse.    Cc: Leonard Downing, *,    Sarina Ill, MD  [facility] [address] [place], [place] [address]  Phone [phone] Fax [phone]  I spent 60 minutes of face-to-face and non-face-to-face time with patient on the  1. Drug-induced polyneuropathy (HCC)    diagnosis.  This included previsit chart review, lab review, study review, order entry, electronic health record documentation, patient education on the different diagnostic and therapeutic options, counseling and  coordination of care, risks and benefits of management, compliance, or risk factor reduction

## 2019-09-30 NOTE — Patient Instructions (Signed)

## 2019-10-03 ENCOUNTER — Ambulatory Visit (HOSPITAL_COMMUNITY)
Admission: RE | Admit: 2019-10-03 | Discharge: 2019-10-03 | Disposition: A | Discharge status: Home / Self Care | Payer: Medicare Other | Source: Ambulatory Visit | Attending: Internal Medicine | Admitting: Internal Medicine

## 2019-10-03 ENCOUNTER — Other Ambulatory Visit: Payer: Self-pay

## 2019-10-03 ENCOUNTER — Inpatient Hospital Stay: Payer: Medicare Other | Attending: Internal Medicine

## 2019-10-03 DIAGNOSIS — W19XXXA Unspecified fall, initial encounter: Secondary | ICD-10-CM | POA: Diagnosis not present

## 2019-10-03 DIAGNOSIS — C3412 Malignant neoplasm of upper lobe, left bronchus or lung: Secondary | ICD-10-CM | POA: Insufficient documentation

## 2019-10-03 DIAGNOSIS — C3490 Malignant neoplasm of unspecified part of unspecified bronchus or lung: Secondary | ICD-10-CM

## 2019-10-03 DIAGNOSIS — S22000S Wedge compression fracture of unspecified thoracic vertebra, sequela: Secondary | ICD-10-CM | POA: Diagnosis not present

## 2019-10-03 DIAGNOSIS — K208 Other esophagitis without bleeding: Secondary | ICD-10-CM | POA: Diagnosis not present

## 2019-10-03 DIAGNOSIS — Z79899 Other long term (current) drug therapy: Secondary | ICD-10-CM | POA: Insufficient documentation

## 2019-10-03 DIAGNOSIS — R1310 Dysphagia, unspecified: Secondary | ICD-10-CM | POA: Insufficient documentation

## 2019-10-03 DIAGNOSIS — I10 Essential (primary) hypertension: Secondary | ICD-10-CM | POA: Diagnosis not present

## 2019-10-03 DIAGNOSIS — Z9221 Personal history of antineoplastic chemotherapy: Secondary | ICD-10-CM | POA: Insufficient documentation

## 2019-10-03 DIAGNOSIS — F419 Anxiety disorder, unspecified: Secondary | ICD-10-CM | POA: Diagnosis not present

## 2019-10-03 DIAGNOSIS — Z923 Personal history of irradiation: Secondary | ICD-10-CM | POA: Diagnosis not present

## 2019-10-03 LAB — CBC WITH DIFFERENTIAL (CANCER CENTER ONLY)
Abs Immature Granulocytes: 0.02 10*3/uL (ref 0.00–0.07)
Basophils Absolute: 0.1 10*3/uL (ref 0.0–0.1)
Basophils Relative: 1 %
Eosinophils Absolute: 0.1 10*3/uL (ref 0.0–0.5)
Eosinophils Relative: 2 %
HCT: 34.6 % — ABNORMAL LOW (ref 36.0–46.0)
Hemoglobin: 11.3 g/dL — ABNORMAL LOW (ref 12.0–15.0)
Immature Granulocytes: 0 %
Lymphocytes Relative: 8 %
Lymphs Abs: 0.4 10*3/uL — ABNORMAL LOW (ref 0.7–4.0)
MCH: 30.4 pg (ref 26.0–34.0)
MCHC: 32.7 g/dL (ref 30.0–36.0)
MCV: 93 fL (ref 80.0–100.0)
Monocytes Absolute: 0.4 10*3/uL (ref 0.1–1.0)
Monocytes Relative: 7 %
Neutro Abs: 4.4 10*3/uL (ref 1.7–7.7)
Neutrophils Relative %: 82 %
Platelet Count: 160 10*3/uL (ref 150–400)
RBC: 3.72 MIL/uL — ABNORMAL LOW (ref 3.87–5.11)
RDW: 14.3 % (ref 11.5–15.5)
WBC Count: 5.4 10*3/uL (ref 4.0–10.5)
nRBC: 0 % (ref 0.0–0.2)

## 2019-10-03 LAB — CMP (CANCER CENTER ONLY)
ALT: 18 U/L (ref 0–44)
AST: 22 U/L (ref 15–41)
Albumin: 3.3 g/dL — ABNORMAL LOW (ref 3.5–5.0)
Alkaline Phosphatase: 142 U/L — ABNORMAL HIGH (ref 38–126)
Anion gap: 9 (ref 5–15)
BUN: 26 mg/dL — ABNORMAL HIGH (ref 8–23)
CO2: 27 mmol/L (ref 22–32)
Calcium: 8.8 mg/dL — ABNORMAL LOW (ref 8.9–10.3)
Chloride: 99 mmol/L (ref 98–111)
Creatinine: 1.22 mg/dL — ABNORMAL HIGH (ref 0.44–1.00)
GFR, Est AFR Am: 46 mL/min — ABNORMAL LOW (ref >60)
GFR, Estimated: 40 mL/min — ABNORMAL LOW (ref >60)
Glucose, Bld: 97 mg/dL (ref 70–99)
Potassium: 4 mmol/L (ref 3.5–5.1)
Sodium: 135 mmol/L (ref 135–145)
Total Bilirubin: 0.6 mg/dL (ref 0.3–1.2)
Total Protein: 6.7 g/dL (ref 6.5–8.1)

## 2019-10-03 MED ORDER — IOHEXOL 300 MG/ML  SOLN
60.0000 mL | Freq: Once | INTRAMUSCULAR | Status: AC | PRN
  Administered 2019-10-03: 60 mL via INTRAVENOUS

## 2019-10-03 MED ORDER — SODIUM CHLORIDE (PF) 0.9 % IJ SOLN
INTRAMUSCULAR | Status: AC
  Filled 2019-10-03: qty 50

## 2019-10-06 ENCOUNTER — Encounter: Payer: Self-pay | Admitting: Internal Medicine

## 2019-10-06 ENCOUNTER — Other Ambulatory Visit: Payer: Self-pay

## 2019-10-06 ENCOUNTER — Inpatient Hospital Stay (HOSPITAL_BASED_OUTPATIENT_CLINIC_OR_DEPARTMENT_OTHER): Payer: Medicare Other | Admitting: Internal Medicine

## 2019-10-06 VITALS — BP 171/73 | HR 85 | Temp 98.7°F | Resp 18 | Ht 61.25 in | Wt 99.1 lb

## 2019-10-06 DIAGNOSIS — C3412 Malignant neoplasm of upper lobe, left bronchus or lung: Secondary | ICD-10-CM | POA: Diagnosis not present

## 2019-10-06 DIAGNOSIS — I10 Essential (primary) hypertension: Secondary | ICD-10-CM | POA: Diagnosis not present

## 2019-10-06 DIAGNOSIS — C3490 Malignant neoplasm of unspecified part of unspecified bronchus or lung: Secondary | ICD-10-CM | POA: Diagnosis not present

## 2019-10-06 DIAGNOSIS — S22000S Wedge compression fracture of unspecified thoracic vertebra, sequela: Secondary | ICD-10-CM | POA: Diagnosis not present

## 2019-10-06 DIAGNOSIS — W19XXXA Unspecified fall, initial encounter: Secondary | ICD-10-CM

## 2019-10-06 NOTE — Patient Instructions (Signed)
Steps to Quit Smoking Smoking tobacco is the leading cause of preventable death. It can affect almost every organ in the body. Smoking puts you and people around you at risk for many serious, long-lasting (chronic) diseases. Quitting smoking can be hard, but it is one of the best things that you can do for your health. It is never too late to quit. How do I get ready to quit? When you decide to quit smoking, make a plan to help you succeed. Before you quit:  Pick a date to quit. Set a date within the next 2 weeks to give you time to prepare.  Write down the reasons why you are quitting. Keep this list in places where you will see it often.  Tell your family, friends, and co-workers that you are quitting. Their support is important.  Talk with your doctor about the choices that may help you quit.  Find out if your health insurance will pay for these treatments.  Know the people, places, things, and activities that make you want to smoke (triggers). Avoid them. What first steps can I take to quit smoking?  Throw away all cigarettes at home, at work, and in your car.  Throw away the things that you use when you smoke, such as ashtrays and lighters.  Clean your car. Make sure to empty the ashtray.  Clean your home, including curtains and carpets. What can I do to help me quit smoking? Talk with your doctor about taking medicines and seeing a counselor at the same time. You are more likely to succeed when you do both.  If you are pregnant or breastfeeding, talk with your doctor about counseling or other ways to quit smoking. Do not take medicine to help you quit smoking unless your doctor tells you to do so. To quit smoking: Quit right away  Quit smoking totally, instead of slowly cutting back on how much you smoke over a period of time.  Go to counseling. You are more likely to quit if you go to counseling sessions regularly. Take medicine You may take medicines to help you quit. Some  medicines need a prescription, and some you can buy over-the-counter. Some medicines may contain a drug called nicotine to replace the nicotine in cigarettes. Medicines may:  Help you to stop having the desire to smoke (cravings).  Help to stop the problems that come when you stop smoking (withdrawal symptoms). Your doctor may ask you to use:  Nicotine patches, gum, or lozenges.  Nicotine inhalers or sprays.  Non-nicotine medicine that is taken by mouth. Find resources Find resources and other ways to help you quit smoking and remain smoke-free after you quit. These resources are most helpful when you use them often. They include:  Online chats with a counselor.  Phone quitlines.  Printed self-help materials.  Support groups or group counseling.  Text messaging programs.  Mobile phone apps. Use apps on your mobile phone or tablet that can help you stick to your quit plan. There are many free apps for mobile phones and tablets as well as websites. Examples include Quit Guide from the CDC and smokefree.gov  What things can I do to make it easier to quit?   Talk to your family and friends. Ask them to support and encourage you.  Call a phone quitline (1-800-QUIT-NOW), reach out to support groups, or work with a counselor.  Ask people who smoke to not smoke around you.  Avoid places that make you want to smoke,   such as: ? Bars. ? Parties. ? Smoke-break areas at work.  Spend time with people who do not smoke.  Lower the stress in your life. Stress can make you want to smoke. Try these things to help your stress: ? Getting regular exercise. ? Doing deep-breathing exercises. ? Doing yoga. ? Meditating. ? Doing a body scan. To do this, close your eyes, focus on one area of your body at a time from head to toe. Notice which parts of your body are tense. Try to relax the muscles in those areas. How will I feel when I quit smoking? Day 1 to 3 weeks Within the first 24 hours,  you may start to have some problems that come from quitting tobacco. These problems are very bad 2-3 days after you quit, but they do not often last for more than 2-3 weeks. You may get these symptoms:  Mood swings.  Feeling restless, nervous, angry, or annoyed.  Trouble concentrating.  Dizziness.  Strong desire for high-sugar foods and nicotine.  Weight gain.  Trouble pooping (constipation).  Feeling like you may vomit (nausea).  Coughing or a sore throat.  Changes in how the medicines that you take for other issues work in your body.  Depression.  Trouble sleeping (insomnia). Week 3 and afterward After the first 2-3 weeks of quitting, you may start to notice more positive results, such as:  Better sense of smell and taste.  Less coughing and sore throat.  Slower heart rate.  Lower blood pressure.  Clearer skin.  Better breathing.  Fewer sick days. Quitting smoking can be hard. Do not give up if you fail the first time. Some people need to try a few times before they succeed. Do your best to stick to your quit plan, and talk with your doctor if you have any questions or concerns. Summary  Smoking tobacco is the leading cause of preventable death. Quitting smoking can be hard, but it is one of the best things that you can do for your health.  When you decide to quit smoking, make a plan to help you succeed.  Quit smoking right away, not slowly over a period of time.  When you start quitting, seek help from your doctor, family, or friends. This information is not intended to replace advice given to you by your health care provider. Make sure you discuss any questions you have with your health care provider. Document Revised: 01/31/2019 Document Reviewed: 07/27/2018 Elsevier Patient Education  2020 [employer]  

## 2019-10-06 NOTE — Progress Notes (Signed)
[place] [phone]   [phone]  OFFICE PROGRESS NOTE  Leonard Downing, MD [place] [address]  DIAGNOSIS: Stage IIIA (T2b, N2, M0) non-small cell lung cancer, squamous cell carcinoma presented with left upper lobe lung mass left hilar and mediastinal lymphadenopathy diagnosed in November 2018. PDL 1 less than 1%.  PRIOR THERAPY: Concurrent chemoradiation with weekly carboplatin for AUC of 2 and paclitaxel 45 mg/M2.  First dose May 21, 2017, status post 5 cycles.  Last dose was given on June 18, 2017.  CURRENT THERAPY: Observation.  INTERVAL HISTORY: [name] 86 y.o. female returns to the clinic today for follow-up visit accompanied by family member.  The patient has a fall around 7 weeks ago and hurt her back.  She did not seek any medical attention since that time.  She is currently using a walker at home.  She denied having any current chest pain, shortness of breath except with exertion with no cough or hemoptysis.  She denied having any fever or chills.  She has no nausea, vomiting, diarrhea or constipation.  She has no headache or visual changes.  She is here today for evaluation with repeat CT scan of the chest for restaging of her disease.  MEDICAL HISTORY: Past Medical History:  Diagnosis Date   Abdominal aortic atherosclerosis ([facility])    CAD (coronary artery disease)    Cancer (HCC)    Carotid artery occlusion    Common bile duct dilation    Gallstones    Headache(784.0)    Hiatal hernia    HLD (hyperlipidemia)    Myocardial infarction (HCC)    PONV (postoperative nausea and vomiting)    Stroke ([facility]) 2003    ALLERGIES:  is allergic to dextroamphetamine sulfate er.  MEDICATIONS:  Current Outpatient Medications  Medication Sig Dispense Refill   aspirin 81 MG EC tablet Take 81 mg by mouth daily.       b complex vitamins capsule Take 1 capsule by mouth daily.      Cholecalciferol (VITAMIN D3) 75 MCG (3000 UT) TABS Take by mouth.     escitalopram (LEXAPRO) 10 MG tablet Take 10 mg by mouth daily.     lisinopril (ZESTRIL) 20 MG tablet      Multiple Vitamins-Minerals (MULTIVITAMIN ADULT EXTRA C) CHEW Chew by mouth.     protein supplement shake (PREMIER PROTEIN) LIQD Take 2 oz by mouth daily with breakfast.     No current facility-administered medications for this visit.    SURGICAL HISTORY:  Past Surgical History:  Procedure Laterality Date   ABDOMINAL HYSTERECTOMY     ANKLE FRACTURE SURGERY     APPENDECTOMY     BLADDER SURGERY     CHOLECYSTECTOMY N/A 05/05/2013   Procedure: LAPAROSCOPIC CHOLECYSTECTOMY WITH INTRAOPERATIVE CHOLANGIOGRAM;  Surgeon: Ralene Ok, MD;  Location: [facility];  Service: General;  Laterality: N/A;   HIP ARTHROPLASTY Right 01/10/2014   Procedure: RIGHT HIP HEMIARTHROPLASTY;  Surgeon: Johnn Hai, MD;  Location: [facility];  Service: Orthopedics;  Laterality: Right;  DEPUY AML LATERAL WITH MARK II   ROTATOR CUFF REPAIR Left    TONSILLECTOMY      REVIEW OF SYSTEMS:  A comprehensive review of systems was negative except for: Constitutional: positive for fatigue Respiratory: positive for dyspnea on exertion Musculoskeletal: positive for back pain   PHYSICAL EXAMINATION: General appearance: alert, cooperative, fatigued and no distress Head: Normocephalic, without obvious abnormality, atraumatic Neck: no adenopathy, no  JVD, supple, symmetrical, trachea midline and thyroid not enlarged, symmetric, no tenderness/mass/nodules Lymph nodes: Cervical, supraclavicular, and axillary nodes normal. Resp: clear to auscultation bilaterally Back: symmetric, no curvature. ROM normal. No CVA tenderness. Cardio: regular rate and rhythm, S1, S2 normal, no murmur, click, rub or gallop GI: soft, non-tender; bowel sounds normal; no masses,  no organomegaly Extremities: extremities normal, atraumatic, no cyanosis or edema    ECOG  PERFORMANCE STATUS: 1 - Symptomatic but completely ambulatory  Blood pressure (!) 171/73, pulse 85, temperature 98.7 F (37.1 C), temperature source Temporal, resp. rate 18, height 5' 1.25" (1.556 m), weight 99 lb 1.6 oz (45 kg), SpO2 97 %.  LABORATORY DATA: Lab Results  Component Value Date   WBC 5.4 10/03/2019   HGB 11.3 (L) 10/03/2019   HCT 34.6 (L) 10/03/2019   MCV 93.0 10/03/2019   PLT 160 10/03/2019      Chemistry      Component Value Date/Time   NA 135 10/03/2019 0953   NA 135 (L) 05/21/2017 0829   K 4.0 10/03/2019 0953   K 3.6 05/21/2017 0829   CL 99 10/03/2019 0953   CO2 27 10/03/2019 0953   CO2 27 05/21/2017 0829   BUN 26 (H) 10/03/2019 0953   BUN 22.6 05/21/2017 0829   CREATININE 1.22 (H) 10/03/2019 0953   CREATININE 1.1 05/21/2017 0829      Component Value Date/Time   CALCIUM 8.8 (L) 10/03/2019 0953   CALCIUM 11.3 (H) 05/21/2017 0829   ALKPHOS 142 (H) 10/03/2019 0953   ALKPHOS 143 05/21/2017 0829   AST 22 10/03/2019 0953   AST 14 05/21/2017 0829   ALT 18 10/03/2019 0953   ALT 15 05/21/2017 0829   BILITOT 0.6 10/03/2019 0953   BILITOT 0.38 05/21/2017 0829       RADIOGRAPHIC STUDIES: CT Chest W Contrast  Result Date: 10/04/2019 CLINICAL DATA:  Non-small-cell lung cancer diagnosed in 2018. Chemotherapy and radiation therapy completed 2 years ago. Dysphagia with weight loss and shortness of breath. EXAM: CT CHEST WITH CONTRAST TECHNIQUE: Multidetector CT imaging of the chest was performed during intravenous contrast administration. CONTRAST:  28mL OMNIPAQUE IOHEXOL 300 MG/ML  SOLN COMPARISON:  Chest CT 04/04/2019 and 10/01/2018. FINDINGS: Cardiovascular: Severe atherosclerosis of the aorta, great vessels and coronary arteries again noted. There is extensive intimal irregularity throughout the aortic arch and descending aorta, similar to previous study. No acute vascular findings are evident. There are calcifications of the aortic valve. The heart size is  normal. There is no pericardial effusion. Mediastinum/Nodes: There are no enlarged mediastinal, hilar or axillary lymph nodes. The thyroid gland, trachea and esophagus demonstrate no significant findings. Lungs/Pleura: New small dependent left pleural effusion. No associated pleural nodularity identified. Mild centrilobular and paraseptal emphysema. There is stable volume loss in the left hemithorax with paramediastinal radiation changes. Scattered small pulmonary nodules bilaterally are stable, some calcified. No new or enlarging nodules identified. Upper abdomen: The visualized upper abdomen appears stable without acute findings. Stable mild thickening of both adrenal glands. Musculoskeletal/Chest wall: There is a new mild superior endplate compression deformity at T3. There is a more significant inferior endplate compression deformity at T6 with approximately 40% loss of vertebral body height. There is associated irregular sclerosis and lucency, and this fracture could be pathologic fracture. A chronic T11 compression deformity is unchanged. Suggested interval weight loss without evidence chest wall mass. IMPRESSION: 1. New mild superior endplate compression deformity at T3 and more significant inferior endplate compression deformity at T6, possibly a pathologic  fracture. Consider further evaluation with thoracic MRI without and with contrast. 2. New small left pleural effusion. Otherwise stable chest CT without additional evidence of metastatic disease. 3. Stable paramediastinal radiation changes in the left hemithorax and small pulmonary nodules bilaterally. 4. Severe aortic Atherosclerosis (ICD10-I70.0) and mild emphysema (ICD10-J43.9). Electronically Signed   By: Richardean Sale M.D.   On: 10/04/2019 12:12    ASSESSMENT AND PLAN: This is a very pleasant 86 years old white female with a stage IIIa non-small cell lung cancer, squamous cell carcinoma.  She she completed a course of concurrent chemoradiation  with weekly carboplatin and paclitaxel status post 5 cycles. She has been tolerating this treatment well except for the significant odynophagia secondary to radiation induced esophagitis. The patient has partial response to this treatment. The patient has been in observation since the completion of her treatment more than 2 years ago. She had repeat CT scan of the chest that showed no concerning findings for disease progression but the patient had new mild superior endplate compression deformity at T3 and compression deformity at T6.  These are likely secondary to her recent fall 7 weeks ago. I discussed the scan results with the patient and her family member. I recommended for her to continue on observation with repeat CT scan of the chest in 6 months. For the compression fractures, I will refer the patient to orthopedic surgery for evaluation and recommendation regarding her condition. For the hypertension, she mentions that her blood pressure is usually normal at home and it just aggravated today with anxiety.  I recommended for her to monitor it closely and discuss with her primary care physician for adjustment of her medication if needed. She was advised to call immediately if she has any concerning symptoms in the interval. The patient voices understanding of current disease status and treatment options and is in agreement with the current care plan. All questions were answered. The patient knows to call the clinic with any problems, questions or concerns. We can certainly see the patient much sooner if necessary.  Disclaimer: This note was dictated with voice recognition software. Similar sounding words can inadvertently be transcribed and may not be corrected upon review.

## 2019-10-07 ENCOUNTER — Telehealth: Payer: Self-pay | Admitting: Internal Medicine

## 2019-10-07 NOTE — Telephone Encounter (Signed)
Scheduled per los. Called and left msg. Mailed printout  °

## 2019-10-08 ENCOUNTER — Ambulatory Visit: Payer: Medicare Other | Admitting: Family Medicine

## 2019-10-08 ENCOUNTER — Other Ambulatory Visit: Payer: Self-pay

## 2019-10-08 ENCOUNTER — Encounter: Payer: Self-pay | Admitting: Family Medicine

## 2019-10-08 DIAGNOSIS — M545 Low back pain, unspecified: Secondary | ICD-10-CM

## 2019-10-08 DIAGNOSIS — S22030A Wedge compression fracture of third thoracic vertebra, initial encounter for closed fracture: Secondary | ICD-10-CM | POA: Diagnosis not present

## 2019-10-08 NOTE — Progress Notes (Signed)
Office Visit Note   Patient: [name]           Date of Birth: 12/17/1928           MRN: [id number] Visit Date: 10/08/2019 Requested by: Curt Bears, [place] [place],  [place] [address] PCP: Leonard Downing, MD  Subjective: Chief Complaint  Patient presents with  . Lower Back - Pain    Fell onto back onto buttocks 8 weeks ago. Compression fractures found on CT scan at oncologist's office. She has pain across her lower back. Normally ambulates with a walker.    HPI: She is here with back pain.  About 8 weeks ago she fell landing on her buttocks.  She does not recall having much back pain immediately.  A few weeks ago she started having pain, mostly in the lumbar area.  Occasionally the pain seems to radiate into the legs.  No bowel or bladder incontinence, occasional numbness in her legs.  She has sensation of weakness as well but this may be chronic for her.  She uses a walker at home.  She went in for routine follow-up with Dr. Earlie Server recently to monitor non-small cell lung cancer.  She had a chest CT scan done showing a T6 compression deformity which was present in November on CT scan, but slightly worse now.  There is also a new compression deformity at T3.  She now presents for treatment options.  She has a history of drug-induced polyneuropathy.  She has cardiovascular disease which has been stable.               ROS: No fevers or chills.  All other systems were reviewed and are negative.  Objective: Vital Signs: There were no vitals taken for this visit.  Physical Exam:  General:  Alert and oriented, in no acute distress. Pulm:  Breathing unlabored. Psy:  Normal mood, congruent affect. Skin: No bruising Back: Today she does not have significant tenderness to palpation of the upper thoracic spine, particularly around T3 and T6.  Most of her tenderness seems to be in the lower lumbar area in the paraspinous muscles.  No significant pain in  the sciatic notch, negative straight leg raise, no pain with internal hip rotation bilaterally.  Lower extremity strength and reflexes are normal and equal.  Imaging: No new x-rays today but I did review her recent CT scan images.   Assessment & Plan: 1.  Thoracolumbar pain with subacute T3 and T6 compression fractures.  The fractures himself do not seem to be causing much pain today, it seems that her pain is more of a lumbar strain type injury. -Discussed options with her and elected to try home health physical therapy.  If she fails to improve after several visits, then we will order x-rays and MRI scan of the lumbar spine.  She did not want any new medications today.     Procedures: No procedures performed  No notes on file     PMFS History: Patient Active Problem List   Diagnosis Date Noted  . Drug-induced polyneuropathy ([facility]) 09/30/2019  . Sensorineural hearing loss (SNHL), bilateral 02/13/2018  . Malnutrition of moderate degree 01/08/2018  . SBO (small bowel obstruction) ([facility]) 01/06/2018  . Community acquired pneumonia   . Hypercalcemia 05/21/2017  . MDD (major depressive disorder), recurrent episode, moderate ([facility]) 05/11/2017  . Dehydration 05/10/2017  . Weakness 05/10/2017  . Encounter for antineoplastic chemotherapy 05/04/2017  . Goals of care, counseling/discussion 05/04/2017  .  Malignant neoplasm of bronchus of left upper lobe ([facility]) 05/03/2017  . Chest congestion 04/03/2017  . Constipation 04/03/2017  . Adenoma of left adrenal gland 04/03/2017  . SIADH (syndrome of inappropriate ADH production) ([facility]) 04/02/2017  . Lung mass 04/02/2017  . Elevated AST (SGOT) 04/02/2017  . Leukocytosis 04/02/2017  . Normocytic anemia 04/02/2017  . Cerumen impaction 11/19/2015  . Hip fracture requiring operative repair ([facility]) 01/09/2014  . Closed right hip fracture ([facility]) 01/09/2014  . Hyponatremia 01/09/2014  . Symptomatic cholelithiasis 03/20/2013  . Dilated bile duct  03/20/2013  . Abdominal aortic atherosclerosis ([facility])   . Hypertension 10/19/2011  . Tobacco abuse 02/18/2009  . HLD (hyperlipidemia) 02/17/2009  . ANXIETY DISORDER 02/17/2009  . CAD (coronary artery disease) 02/17/2009  . CAROTID ARTERY OCCLUSION 02/17/2009  . CVA 02/17/2009   Past Medical History:  Diagnosis Date  . Abdominal aortic atherosclerosis ([facility])   . CAD (coronary artery disease)   . Cancer ([facility])   . Carotid artery occlusion   . Common bile duct dilation   . Gallstones   . Headache(784.0)   . Hiatal hernia   . HLD (hyperlipidemia)   . Myocardial infarction ([facility])   . PONV (postoperative nausea and vomiting)   . Stroke [facility]) 2003    Family History  Problem Relation Age of Onset  . Stroke Mother   . Breast cancer Maternal Aunt   . Prostate cancer Maternal Grandfather   . Neuropathy Neg Hx     Past Surgical History:  Procedure Laterality Date  . ABDOMINAL HYSTERECTOMY    . ANKLE FRACTURE SURGERY    . APPENDECTOMY    . BLADDER SURGERY    . CHOLECYSTECTOMY N/A 05/05/2013   Procedure: LAPAROSCOPIC CHOLECYSTECTOMY WITH INTRAOPERATIVE CHOLANGIOGRAM;  Surgeon: Ralene Ok, MD;  Location: [facility];  Service: General;  Laterality: N/A;  . HIP ARTHROPLASTY Right 01/10/2014   Procedure: RIGHT HIP HEMIARTHROPLASTY;  Surgeon: Johnn Hai, MD;  Location: [facility];  Service: Orthopedics;  Laterality: Right;  DEPUY AML LATERAL WITH MARK II  . ROTATOR CUFF REPAIR Left   . TONSILLECTOMY     Social History   Occupational History  . Occupation: Retired  Tobacco Use  . Smoking status: Current Every Day Smoker    Packs/day: 0.50    Types: Cigarettes  . Smokeless tobacco: Never Used  Substance and Sexual Activity  . Alcohol use: Not Currently  . Drug use: No  . Sexual activity: Not on file

## 2019-10-09 ENCOUNTER — Telehealth: Payer: Self-pay | Admitting: Family Medicine

## 2019-10-09 NOTE — Telephone Encounter (Signed)
Patient called. She would like to know if she can get PT at home. He call back number is [phone]

## 2019-10-10 NOTE — Telephone Encounter (Signed)
I called the patient: we are in the process of getting [facility] to come out to her. They will be in contact, hopefully soon.

## 2019-10-12 ENCOUNTER — Encounter (HOSPITAL_COMMUNITY): Payer: Self-pay | Admitting: Emergency Medicine

## 2019-10-12 ENCOUNTER — Emergency Department (HOSPITAL_COMMUNITY): Payer: Medicare Other

## 2019-10-12 ENCOUNTER — Other Ambulatory Visit: Payer: Self-pay

## 2019-10-12 ENCOUNTER — Inpatient Hospital Stay (HOSPITAL_COMMUNITY)
Admission: EM | Admit: 2019-10-12 | Discharge: 2019-10-14 | DRG: 388 | Disposition: A | Discharge status: Home / Self Care | Payer: Medicare Other | Attending: Critical Care Medicine | Admitting: Critical Care Medicine

## 2019-10-12 DIAGNOSIS — Z96641 Presence of right artificial hip joint: Secondary | ICD-10-CM | POA: Diagnosis present

## 2019-10-12 DIAGNOSIS — G9341 Metabolic encephalopathy: Secondary | ICD-10-CM | POA: Diagnosis present

## 2019-10-12 DIAGNOSIS — E871 Hypo-osmolality and hyponatremia: Secondary | ICD-10-CM | POA: Diagnosis present

## 2019-10-12 DIAGNOSIS — R531 Weakness: Secondary | ICD-10-CM

## 2019-10-12 DIAGNOSIS — K208 Other esophagitis without bleeding: Secondary | ICD-10-CM | POA: Diagnosis present

## 2019-10-12 DIAGNOSIS — Z515 Encounter for palliative care: Secondary | ICD-10-CM

## 2019-10-12 DIAGNOSIS — Z66 Do not resuscitate: Secondary | ICD-10-CM

## 2019-10-12 DIAGNOSIS — Z90710 Acquired absence of both cervix and uterus: Secondary | ICD-10-CM | POA: Diagnosis not present

## 2019-10-12 DIAGNOSIS — Z7982 Long term (current) use of aspirin: Secondary | ICD-10-CM

## 2019-10-12 DIAGNOSIS — E874 Mixed disorder of acid-base balance: Secondary | ICD-10-CM | POA: Diagnosis present

## 2019-10-12 DIAGNOSIS — J9601 Acute respiratory failure with hypoxia: Secondary | ICD-10-CM | POA: Diagnosis present

## 2019-10-12 DIAGNOSIS — I70201 Unspecified atherosclerosis of native arteries of extremities, right leg: Secondary | ICD-10-CM | POA: Diagnosis present

## 2019-10-12 DIAGNOSIS — I214 Non-ST elevation (NSTEMI) myocardial infarction: Secondary | ICD-10-CM | POA: Diagnosis present

## 2019-10-12 DIAGNOSIS — K56600 Partial intestinal obstruction, unspecified as to cause: Principal | ICD-10-CM | POA: Diagnosis present

## 2019-10-12 DIAGNOSIS — Z79899 Other long term (current) drug therapy: Secondary | ICD-10-CM

## 2019-10-12 DIAGNOSIS — I252 Old myocardial infarction: Secondary | ICD-10-CM | POA: Diagnosis not present

## 2019-10-12 DIAGNOSIS — C3412 Malignant neoplasm of upper lobe, left bronchus or lung: Secondary | ICD-10-CM | POA: Diagnosis present

## 2019-10-12 DIAGNOSIS — R54 Age-related physical debility: Secondary | ICD-10-CM | POA: Diagnosis present

## 2019-10-12 DIAGNOSIS — Y842 Radiological procedure and radiotherapy as the cause of abnormal reaction of the patient, or of later complication, without mention of misadventure at the time of the procedure: Secondary | ICD-10-CM | POA: Diagnosis present

## 2019-10-12 DIAGNOSIS — J690 Pneumonitis due to inhalation of food and vomit: Secondary | ICD-10-CM | POA: Diagnosis present

## 2019-10-12 DIAGNOSIS — I10 Essential (primary) hypertension: Secondary | ICD-10-CM | POA: Diagnosis present

## 2019-10-12 DIAGNOSIS — R0603 Acute respiratory distress: Secondary | ICD-10-CM | POA: Insufficient documentation

## 2019-10-12 DIAGNOSIS — I2510 Atherosclerotic heart disease of native coronary artery without angina pectoris: Secondary | ICD-10-CM | POA: Diagnosis present

## 2019-10-12 DIAGNOSIS — R627 Adult failure to thrive: Secondary | ICD-10-CM | POA: Diagnosis not present

## 2019-10-12 DIAGNOSIS — Z8673 Personal history of transient ischemic attack (TIA), and cerebral infarction without residual deficits: Secondary | ICD-10-CM | POA: Diagnosis not present

## 2019-10-12 DIAGNOSIS — Z9049 Acquired absence of other specified parts of digestive tract: Secondary | ICD-10-CM | POA: Diagnosis not present

## 2019-10-12 DIAGNOSIS — R109 Unspecified abdominal pain: Secondary | ICD-10-CM | POA: Diagnosis not present

## 2019-10-12 DIAGNOSIS — K56609 Unspecified intestinal obstruction, unspecified as to partial versus complete obstruction: Secondary | ICD-10-CM | POA: Diagnosis present

## 2019-10-12 DIAGNOSIS — R339 Retention of urine, unspecified: Secondary | ICD-10-CM | POA: Diagnosis not present

## 2019-10-12 DIAGNOSIS — Z823 Family history of stroke: Secondary | ICD-10-CM

## 2019-10-12 DIAGNOSIS — I472 Ventricular tachycardia: Secondary | ICD-10-CM | POA: Diagnosis present

## 2019-10-12 DIAGNOSIS — Z888 Allergy status to other drugs, medicaments and biological substances status: Secondary | ICD-10-CM

## 2019-10-12 DIAGNOSIS — R609 Edema, unspecified: Secondary | ICD-10-CM | POA: Diagnosis not present

## 2019-10-12 DIAGNOSIS — Z20822 Contact with and (suspected) exposure to covid-19: Secondary | ICD-10-CM | POA: Diagnosis present

## 2019-10-12 DIAGNOSIS — E785 Hyperlipidemia, unspecified: Secondary | ICD-10-CM | POA: Diagnosis present

## 2019-10-12 DIAGNOSIS — R778 Other specified abnormalities of plasma proteins: Secondary | ICD-10-CM | POA: Diagnosis not present

## 2019-10-12 DIAGNOSIS — L8990 Pressure ulcer of unspecified site, unspecified stage: Secondary | ICD-10-CM | POA: Insufficient documentation

## 2019-10-12 DIAGNOSIS — F17210 Nicotine dependence, cigarettes, uncomplicated: Secondary | ICD-10-CM | POA: Diagnosis present

## 2019-10-12 LAB — POCT I-STAT EG7
Acid-base deficit: 4 mmol/L — ABNORMAL HIGH (ref 0.0–2.0)
Bicarbonate: 23.8 mmol/L (ref 20.0–28.0)
Calcium, Ion: 1.12 mmol/L — ABNORMAL LOW (ref 1.15–1.40)
HCT: 38 % (ref 36.0–46.0)
Hemoglobin: 12.9 g/dL (ref 12.0–15.0)
O2 Saturation: 90 %
Potassium: 3.8 mmol/L (ref 3.5–5.1)
Sodium: 126 mmol/L — ABNORMAL LOW (ref 135–145)
TCO2: 26 mmol/L (ref 22–32)
pCO2, Ven: 57 mmHg (ref 44.0–60.0)
pH, Ven: 7.229 — ABNORMAL LOW (ref 7.250–7.430)
pO2, Ven: 71 mmHg — ABNORMAL HIGH (ref 32.0–45.0)

## 2019-10-12 LAB — CK: Total CK: 54 U/L (ref 38–234)

## 2019-10-12 LAB — COMPREHENSIVE METABOLIC PANEL
ALT: 13 U/L (ref 0–44)
AST: 18 U/L (ref 15–41)
Albumin: 2.9 g/dL — ABNORMAL LOW (ref 3.5–5.0)
Alkaline Phosphatase: 172 U/L — ABNORMAL HIGH (ref 38–126)
Anion gap: 9 (ref 5–15)
BUN: 14 mg/dL (ref 8–23)
CO2: 25 mmol/L (ref 22–32)
Calcium: 8.9 mg/dL (ref 8.9–10.3)
Chloride: 95 mmol/L — ABNORMAL LOW (ref 98–111)
Creatinine, Ser: 0.77 mg/dL (ref 0.44–1.00)
GFR calc Af Amer: >60 mL/min (ref >60)
GFR calc non Af Amer: >60 mL/min (ref >60)
Glucose, Bld: 104 mg/dL — ABNORMAL HIGH (ref 70–99)
Potassium: 4.5 mmol/L (ref 3.5–5.1)
Sodium: 129 mmol/L — ABNORMAL LOW (ref 135–145)
Total Bilirubin: 1.1 mg/dL (ref 0.3–1.2)
Total Protein: 6.4 g/dL — ABNORMAL LOW (ref 6.5–8.1)

## 2019-10-12 LAB — CBC
HCT: 34.3 % — ABNORMAL LOW (ref 36.0–46.0)
HCT: 34.7 % — ABNORMAL LOW (ref 36.0–46.0)
Hemoglobin: 11.3 g/dL — ABNORMAL LOW (ref 12.0–15.0)
Hemoglobin: 11.3 g/dL — ABNORMAL LOW (ref 12.0–15.0)
MCH: 30 pg (ref 26.0–34.0)
MCH: 30.2 pg (ref 26.0–34.0)
MCHC: 32.9 g/dL (ref 30.0–36.0)
MCHC: 32.6 g/dL (ref 30.0–36.0)
MCV: 91 fL (ref 80.0–100.0)
MCV: 92.8 fL (ref 80.0–100.0)
Platelets: 242 10*3/uL (ref 150–400)
Platelets: 229 10*3/uL (ref 150–400)
RBC: 3.77 MIL/uL — ABNORMAL LOW (ref 3.87–5.11)
RBC: 3.74 MIL/uL — ABNORMAL LOW (ref 3.87–5.11)
RDW: 13.8 % (ref 11.5–15.5)
RDW: 13.7 % (ref 11.5–15.5)
WBC: 7.1 10*3/uL (ref 4.0–10.5)
WBC: 11.8 10*3/uL — ABNORMAL HIGH (ref 4.0–10.5)
nRBC: 0 % (ref 0.0–0.2)
nRBC: 0 % (ref 0.0–0.2)

## 2019-10-12 LAB — BASIC METABOLIC PANEL
Anion gap: 13 (ref 5–15)
BUN: 15 mg/dL (ref 8–23)
CO2: 22 mmol/L (ref 22–32)
Calcium: 8.4 mg/dL — ABNORMAL LOW (ref 8.9–10.3)
Chloride: 94 mmol/L — ABNORMAL LOW (ref 98–111)
Creatinine, Ser: 0.98 mg/dL (ref 0.44–1.00)
GFR calc Af Amer: 60 mL/min — ABNORMAL LOW (ref >60)
GFR calc non Af Amer: 52 mL/min — ABNORMAL LOW (ref >60)
Glucose, Bld: 126 mg/dL — ABNORMAL HIGH (ref 70–99)
Potassium: 4.6 mmol/L (ref 3.5–5.1)
Sodium: 129 mmol/L — ABNORMAL LOW (ref 135–145)

## 2019-10-12 LAB — TROPONIN I (HIGH SENSITIVITY)
Troponin I (High Sensitivity): 100 ng/L (ref <18)
Troponin I (High Sensitivity): 1468 ng/L (ref <18)

## 2019-10-12 LAB — LACTIC ACID, PLASMA
Lactic Acid, Venous: 2.5 mmol/L (ref 0.5–1.9)
Lactic Acid, Venous: 1.1 mmol/L (ref 0.5–1.9)

## 2019-10-12 LAB — CREATININE, SERUM
Creatinine, Ser: 0.83 mg/dL (ref 0.44–1.00)
GFR calc Af Amer: >60 mL/min (ref >60)
GFR calc non Af Amer: >60 mL/min (ref >60)

## 2019-10-12 LAB — GLUCOSE, CAPILLARY: Glucose-Capillary: 110 mg/dL — ABNORMAL HIGH (ref 70–99)

## 2019-10-12 LAB — MAGNESIUM: Magnesium: 2.1 mg/dL (ref 1.7–2.4)

## 2019-10-12 LAB — SARS CORONAVIRUS 2 BY RT PCR (HOSPITAL ORDER, PERFORMED IN ~~LOC~~ HOSPITAL LAB): SARS Coronavirus 2: NEGATIVE

## 2019-10-12 LAB — LIPASE, BLOOD: Lipase: 50 U/L (ref 11–51)

## 2019-10-12 LAB — BRAIN NATRIURETIC PEPTIDE: B Natriuretic Peptide: 1711.5 pg/mL — ABNORMAL HIGH (ref 0.0–100.0)

## 2019-10-12 LAB — PHOSPHORUS: Phosphorus: 4.8 mg/dL — ABNORMAL HIGH (ref 2.5–4.6)

## 2019-10-12 MED ORDER — ONDANSETRON HCL 4 MG/2ML IJ SOLN
4.0000 mg | Freq: Once | INTRAMUSCULAR | Status: DC
  Filled 2019-10-12: qty 2

## 2019-10-12 MED ORDER — FENTANYL CITRATE (PF) 100 MCG/2ML IJ SOLN: 12.5000 ug | INTRAMUSCULAR | Status: DC | PRN

## 2019-10-12 MED ORDER — SODIUM CHLORIDE 0.9 % IV BOLUS
1000.0000 mL | Freq: Once | INTRAVENOUS | Status: AC
  Administered 2019-10-12: 1000 mL via INTRAVENOUS

## 2019-10-12 MED ORDER — SODIUM CHLORIDE 0.9 % IV SOLN: Freq: Once | INTRAVENOUS | Status: AC

## 2019-10-12 MED ORDER — MORPHINE SULFATE (PF) 2 MG/ML IV SOLN
2.0000 mg | Freq: Once | INTRAVENOUS | Status: AC
  Administered 2019-10-12: 2 mg via INTRAVENOUS
  Filled 2019-10-12: qty 1

## 2019-10-12 MED ORDER — IOHEXOL 300 MG/ML  SOLN
100.0000 mL | Freq: Once | INTRAMUSCULAR | Status: AC | PRN
  Administered 2019-10-12: 100 mL via INTRAVENOUS

## 2019-10-12 MED ORDER — NITROGLYCERIN IN D5W 200-5 MCG/ML-% IV SOLN
INTRAVENOUS | Status: AC
  Administered 2019-10-12: 5 ug/min via INTRAVENOUS
  Filled 2019-10-12: qty 250

## 2019-10-12 MED ORDER — NITROGLYCERIN IN D5W 200-5 MCG/ML-% IV SOLN: 0.0000 ug/min | INTRAVENOUS | Status: DC

## 2019-10-12 MED ORDER — POLYETHYLENE GLYCOL 3350 17 G PO PACK: 17.0000 g | PACK | Freq: Every day | ORAL | Status: DC | PRN

## 2019-10-12 MED ORDER — PIPERACILLIN-TAZOBACTAM 3.375 G IVPB 30 MIN
3.3750 g | Freq: Once | INTRAVENOUS | Status: AC
  Administered 2019-10-12: 3.375 g via INTRAVENOUS
  Filled 2019-10-12: qty 50

## 2019-10-12 MED ORDER — HEPARIN BOLUS VIA INFUSION
2500.0000 [IU] | Freq: Once | INTRAVENOUS | Status: AC
  Administered 2019-10-12: 2500 [IU] via INTRAVENOUS
  Filled 2019-10-12: qty 2500

## 2019-10-12 MED ORDER — HEPARIN SODIUM (PORCINE) 5000 UNIT/ML IJ SOLN: 5000.0000 [IU] | Freq: Three times a day (TID) | INTRAMUSCULAR | Status: DC

## 2019-10-12 MED ORDER — PIPERACILLIN-TAZOBACTAM 3.375 G IVPB
3.3750 g | Freq: Three times a day (TID) | INTRAVENOUS | Status: DC
  Administered 2019-10-13: 3.375 g via INTRAVENOUS
  Filled 2019-10-12 (×2): qty 50

## 2019-10-12 MED ORDER — FUROSEMIDE 10 MG/ML IJ SOLN
40.0000 mg | Freq: Two times a day (BID) | INTRAMUSCULAR | Status: DC
  Administered 2019-10-12: 40 mg via INTRAVENOUS
  Filled 2019-10-12: qty 4

## 2019-10-12 MED ORDER — DOCUSATE SODIUM 100 MG PO CAPS: 100.0000 mg | ORAL_CAPSULE | Freq: Two times a day (BID) | ORAL | Status: DC | PRN

## 2019-10-12 MED ORDER — CHLORHEXIDINE GLUCONATE CLOTH 2 % EX PADS: 6.0000 | MEDICATED_PAD | Freq: Every day | CUTANEOUS | Status: DC

## 2019-10-12 MED ORDER — ORAL CARE MOUTH RINSE
15.0000 mL | Freq: Two times a day (BID) | OROMUCOSAL | Status: DC
  Administered 2019-10-13 – 2019-10-14 (×3): 15 mL via OROMUCOSAL

## 2019-10-12 MED ORDER — LORAZEPAM 2 MG/ML IJ SOLN: 0.5000 mg | Freq: Once | INTRAMUSCULAR | Status: AC

## 2019-10-12 MED ORDER — AMIODARONE IV BOLUS ONLY 150 MG/100ML
150.0000 mg | Freq: Once | INTRAVENOUS | Status: AC
  Administered 2019-10-12: 150 mg via INTRAVENOUS
  Filled 2019-10-12: qty 100

## 2019-10-12 MED ORDER — SODIUM CHLORIDE 0.9% FLUSH: 3.0000 mL | Freq: Once | INTRAVENOUS | Status: DC

## 2019-10-12 MED ORDER — LORAZEPAM 2 MG/ML IJ SOLN
INTRAMUSCULAR | Status: AC
  Administered 2019-10-12: 0.5 mg via INTRAVENOUS
  Filled 2019-10-12: qty 1

## 2019-10-12 MED ORDER — HEPARIN (PORCINE) 25000 UT/250ML-% IV SOLN
650.0000 [IU]/h | INTRAVENOUS | Status: DC
  Administered 2019-10-12: 550 [IU]/h via INTRAVENOUS
  Filled 2019-10-12: qty 250

## 2019-10-12 NOTE — ED Provider Notes (Signed)
[place] EMERGENCY DEPARTMENT Provider Note   CSN: [id number] Arrival date & time: 10/12/19  1409     History Chief Complaint  Patient presents with  . Constipation    [name] is a 86 y.o. female.  86 year old female with prior medical history as detailed below presents for evaluation of constipation and abdominal discomfort.  Patient reports that her last bowel movement was roughly 1 week prior.  She denies nausea or vomiting.  She reports feeling gassy and distended.  She denies fever.  She reports prior history of suspected bowel obstruction.  The history is provided by the patient and medical records.  Constipation Severity:  Moderate Time since last bowel movement:  1 week Timing:  Constant Progression:  Waxing and waning Chronicity:  New Stool description:  None produced Relieved by:  Nothing Worsened by:  Nothing      Past Medical History:  Diagnosis Date  . Abdominal aortic atherosclerosis ([place])   . CAD (coronary artery disease)   . Cancer ([place])   . Carotid artery occlusion   . Common bile duct dilation   . Gallstones   . Headache(784.0)   . Hiatal hernia   . HLD (hyperlipidemia)   . Myocardial infarction ([place])   . PONV (postoperative nausea and vomiting)   . Stroke [facility]) 2003    Patient Active Problem List   Diagnosis Date Noted  . Drug-induced polyneuropathy ([place]) 09/30/2019  . Sensorineural hearing loss (SNHL), bilateral 02/13/2018  . Malnutrition of moderate degree 01/08/2018  . SBO (small bowel obstruction) ([place]) 01/06/2018  . Community acquired pneumonia   . Hypercalcemia 05/21/2017  . MDD (major depressive disorder), recurrent episode, moderate ([place]) 05/11/2017  . Dehydration 05/10/2017  . Weakness 05/10/2017  . Encounter for antineoplastic chemotherapy 05/04/2017  . Goals of care, counseling/discussion 05/04/2017  . Malignant neoplasm of bronchus of left upper lobe ([place]) 05/03/2017  . Chest congestion  04/03/2017  . Constipation 04/03/2017  . Adenoma of left adrenal gland 04/03/2017  . SIADH (syndrome of inappropriate ADH production) ([place]) 04/02/2017  . Lung mass 04/02/2017  . Elevated AST (SGOT) 04/02/2017  . Leukocytosis 04/02/2017  . Normocytic anemia 04/02/2017  . Cerumen impaction 11/19/2015  . Hip fracture requiring operative repair ([place]) 01/09/2014  . Closed right hip fracture ([place]) 01/09/2014  . Hyponatremia 01/09/2014  . Symptomatic cholelithiasis 03/20/2013  . Dilated bile duct 03/20/2013  . Abdominal aortic atherosclerosis ([place])   . Hypertension 10/19/2011  . Tobacco abuse 02/18/2009  . HLD (hyperlipidemia) 02/17/2009  . ANXIETY DISORDER 02/17/2009  . CAD (coronary artery disease) 02/17/2009  . CAROTID ARTERY OCCLUSION 02/17/2009  . CVA 02/17/2009    Past Surgical History:  Procedure Laterality Date  . ABDOMINAL HYSTERECTOMY    . ANKLE FRACTURE SURGERY    . APPENDECTOMY    . BLADDER SURGERY    . CHOLECYSTECTOMY N/A 05/05/2013   Procedure: LAPAROSCOPIC CHOLECYSTECTOMY WITH INTRAOPERATIVE CHOLANGIOGRAM;  Surgeon: Ralene Ok, MD;  Location: [facility];  Service: General;  Laterality: N/A;  . HIP ARTHROPLASTY Right 01/10/2014   Procedure: RIGHT HIP HEMIARTHROPLASTY;  Surgeon: Johnn Hai, MD;  Location: [facility];  Service: Orthopedics;  Laterality: Right;  DEPUY AML LATERAL WITH MARK II  . ROTATOR CUFF REPAIR Left   . TONSILLECTOMY       OB History   No obstetric history on file.     Family History  Problem Relation Age of Onset  . Stroke Mother   . Breast cancer Maternal Aunt   .  Prostate cancer Maternal Grandfather   . Neuropathy Neg Hx     Social History   Tobacco Use  . Smoking status: Current Every Day Smoker    Packs/day: 0.50    Types: Cigarettes  . Smokeless tobacco: Never Used  Substance Use Topics  . Alcohol use: Not Currently  . Drug use: No    Home Medications Prior to Admission medications   Medication Sig Start Date End Date  Taking? Authorizing Provider  aspirin 81 MG EC tablet Take 81 mg by mouth daily.      [provider]  b complex vitamins capsule Take 1 capsule by mouth daily.    [provider]  Cholecalciferol (VITAMIN D3) 75 MCG (3000 UT) TABS Take by mouth.    [provider]  escitalopram (LEXAPRO) 10 MG tablet Take 10 mg by mouth daily. 02/11/19   [provider]  lisinopril (ZESTRIL) 20 MG tablet  11/14/18   [provider]  Multiple Vitamins-Minerals (MULTIVITAMIN ADULT EXTRA C) CHEW Chew by mouth.    [provider]  protein supplement shake (PREMIER PROTEIN) LIQD Take 2 oz by mouth daily with breakfast.    [provider]  traMADol (ULTRAM) 50 MG tablet Take 50 mg by mouth 5 (five) times daily as needed. 10/06/19   [provider]    Allergies    Dextroamphetamine sulfate er  Review of Systems   Review of Systems  Gastrointestinal: Positive for constipation.  All other systems reviewed and are negative.   Physical Exam Updated Vital Signs BP (!) 165/77   Pulse 79   Temp 99.3 F (37.4 C) (Oral)   Resp 16   Ht 5\' 1"  (1.549 m)   Wt 44.5 kg   SpO2 97%   BMI 18.52 kg/m   Physical Exam Vitals and nursing note reviewed.  Constitutional:      General: She is not in acute distress.    Appearance: Normal appearance. She is well-developed.  HENT:     Head: Normocephalic and atraumatic.  Eyes:     Conjunctiva/sclera: Conjunctivae normal.     Pupils: Pupils are equal, round, and reactive to light.  Cardiovascular:     Rate and Rhythm: Normal rate and regular rhythm.     Heart sounds: Normal heart sounds.  Pulmonary:     Effort: Pulmonary effort is normal. No respiratory distress.     Breath sounds: Normal breath sounds.  Abdominal:     General: There is distension.     Palpations: Abdomen is soft.     Tenderness: There is no abdominal tenderness.  Musculoskeletal:        General: No deformity. Normal range of  motion.     Cervical back: Normal range of motion and neck supple.  Skin:    General: Skin is warm and dry.  Neurological:     Mental Status: She is alert and oriented to person, place, and time.     ED Results / Procedures / Treatments   Labs (all labs ordered are listed, but only abnormal results are displayed) Labs Reviewed  COMPREHENSIVE METABOLIC PANEL - Abnormal; Notable for the following components:      Result Value   Sodium 129 (*)    Chloride 95 (*)    Glucose, Bld 104 (*)    Total Protein 6.4 (*)    Albumin 2.9 (*)    Alkaline Phosphatase 172 (*)    All other components within normal limits  CBC - Abnormal; Notable for  the following components:   RBC 3.77 (*)    Hemoglobin 11.3 (*)    HCT 34.3 (*)    All other components within normal limits  LACTIC ACID, PLASMA - Abnormal; Notable for the following components:   Lactic Acid, Venous 2.5 (*)    All other components within normal limits  BRAIN NATRIURETIC PEPTIDE - Abnormal; Notable for the following components:   B Natriuretic Peptide 1,711.5 (*)    All other components within normal limits  POCT I-STAT EG7 - Abnormal; Notable for the following components:   pH, Ven 7.229 (*)    pO2, Ven 71.0 (*)    Acid-base deficit 4.0 (*)    Sodium 126 (*)    Calcium, Ion 1.12 (*)    All other components within normal limits  TROPONIN I (HIGH SENSITIVITY) - Abnormal; Notable for the following components:   Troponin I (High Sensitivity) 100 (*)    All other components within normal limits  SARS CORONAVIRUS 2 BY RT PCR (HOSPITAL ORDER, [facility])  CULTURE, BLOOD (ROUTINE X 2)  CULTURE, BLOOD (ROUTINE X 2)  LIPASE, BLOOD  URINALYSIS, ROUTINE W REFLEX MICROSCOPIC  LACTIC ACID, PLASMA  CBC  CREATININE, SERUM  CK  MAGNESIUM  PHOSPHORUS  BASIC METABOLIC PANEL  TROPONIN I (HIGH SENSITIVITY)    EKG EKG Interpretation  Date/Time:  Sunday Oct 12 2019 19:04:27 EDT Ventricular Rate:  141 PR  Interval:    QRS Duration: 174 QT Interval:  375 QTC Calculation: 575 R Axis:   63 Text Interpretation: Extreme tachycardia with wide complex, no further rhythm analysis attempted Confirmed by Dene Gentry [phone]) on 10/12/2019 7:08:14 PM   Radiology CT Abdomen Pelvis W Contrast  Result Date: 10/12/2019 CLINICAL DATA:  Suspected bowel obstruction. EXAM: CT ABDOMEN AND PELVIS WITH CONTRAST TECHNIQUE: Multidetector CT imaging of the abdomen and pelvis was performed using the standard protocol following bolus administration of intravenous contrast. CONTRAST:  162mL OMNIPAQUE IOHEXOL 300 MG/ML  SOLN COMPARISON:  None. FINDINGS: Lower chest: Left pleural effusion. Enlarged heart. Atherosclerotic disease of the aorta with calcified and noncalcified plaque. Hepatobiliary: No focal liver abnormality is seen. Status post cholecystectomy. No biliary dilatation. Pancreas: Unremarkable. No pancreatic ductal dilatation or surrounding inflammatory changes. Spleen: Normal in size without focal abnormality. Adrenals/Urinary Tract: Adrenal glands are unremarkable. Kidneys are normal, without renal calculi, focal lesion, or hydronephrosis. Bladder is unremarkable. Stomach/Bowel: Normal appearance of the stomach and proximal small bowel. Fluid-filled dilated distal small bowel loops measuring up to 4.1 cm transversely. Transitional point is identified in the left mid abdomen. Residual gas and stool in the colon. Vascular/Lymphatic: Aortic atherosclerosis. No enlarged abdominal or pelvic lymph nodes. Reproductive: Status post hysterectomy. No adnexal masses. Other: No abdominal wall hernia or abnormality. No abdominopelvic ascites. Musculoskeletal: Moderate in severity compression fracture of T10 vertebral body, which was not present on the chest CT dated Oct 03, 2019 chronic T11 compression fracture. IMPRESSION: 1. Early/incomplete small bowel obstruction with transition point in the left mid abdomen. 2. Moderate in  severity compression fracture of T10 vertebral body, new from Oct 03, 2019. 3. Stable chronic T12 compression fracture. 4. Left pleural effusion. 5. Enlarged heart. Aortic Atherosclerosis (ICD10-I70.0). Electronically Signed   By: Fidela Salisbury M.D.   On: 10/12/2019 18:57   DG Chest Port 1 View  Result Date: 10/12/2019 CLINICAL DATA:  Shortness of breath EXAM: PORTABLE CHEST 1 VIEW COMPARISON:  CT scan Oct 03, 2019 FINDINGS: Opacity in the left upper lobe is at  the site of the patient's known treated malignancy and likely represents post treatment change. Probable small left effusion. Mild edema identified. No other acute abnormalities. IMPRESSION: 1. Post treatment changes in the left upper lung. Mild edema and small left effusion. Electronically Signed   By: Dorise Bullion III M.D   On: 10/12/2019 19:16    Procedures Procedures (including critical care time) CRITICAL CARE Performed by: Valarie Merino   Total critical care time: 60 minutes  Critical care time was exclusive of separately billable procedures and treating other patients.  Critical care was necessary to treat or prevent imminent or life-threatening deterioration.  Critical care was time spent personally by me on the following activities: development of treatment plan with patient and/or surrogate as well as nursing, discussions with consultants, evaluation of patient's response to treatment, examination of patient, obtaining history from patient or surrogate, ordering and performing treatments and interventions, ordering and review of laboratory studies, ordering and review of radiographic studies, pulse oximetry and re-evaluation of patient's condition.   Medications Ordered in ED Medications  sodium chloride flush (NS) 0.9 % injection 3 mL (3 mLs Intravenous Not Given 10/12/19 1617)  0.9 %  sodium chloride infusion ( Intravenous New Bag/Given 10/12/19 1617)    ED Course  I have reviewed the triage vital signs and  the nursing notes.  Pertinent labs & imaging results that were available during my care of the patient were reviewed by me and considered in my medical decision making (see chart for details).    MDM Rules/Calculators/A&P                      MDM  Screen complete  JULIONNA MARCZAK was evaluated in Emergency Department on 10/12/2019 for the symptoms described in the history of present illness. She was evaluated in the context of the global COVID-19 pandemic, which necessitated consideration that the patient might be at risk for infection with the SARS-CoV-2 virus that causes COVID-19. Institutional protocols and algorithms that pertain to the evaluation of patients at risk for COVID-19 are in a state of rapid change based on information released by regulatory bodies including the CDC and federal and state organizations. These policies and algorithms were followed during the patient's care in the ED.   Patient initially presented for constipation and abdominal distention.  CT abdomen pelvis was obtained and revealed likely SBO.  Shortly after returning from CT patient had an event associated with large bowel movement and possible vomiting.  Patient was noted to be hypoxic in respiratory distress per nursing.  Patient's blood pressure was significantly elevated with a wide-based QRS tachycardia.  Patient was given increased FiO2, amiodarone load, nitroglycerin drip for BP control.  Differential diagnosis included possible aspiration event versus flash pulmonary edema.  Patient could not fully confirm DNR status while in extremis.  Critical care service was called for evaluation and consultation.  Cardiology service was called for evaluation and consultation.  Cardiology agrees with treatment with amiodarone.  Critical care consulted.  Patient's DNR status was confirmed with her granddaughter who has POA.  Patient will be admitted to critical care with surgery consulting for evaluation of her  SBO.      Final Clinical Impression(s) / ED Diagnoses Final diagnoses:  Small bowel obstruction [facility])  Respiratory distress    Rx / DC Orders ED Discharge Orders    None       Valarie Merino, MD 10/12/19 2055

## 2019-10-12 NOTE — H&P (Signed)
NAME:  [name], MRN:  [id number], DOB:  [birth year], LOS: 0 ADMISSION DATE:  10/12/2019, CONSULTATION DATE:  10/12/19 REFERRING MD:  EDP, CHIEF COMPLAINT:  Abdominal pain    Brief History   86 year old female medical history of stage III non-small cell lung cancer in remission, CAD, hyperlipidemia who presented with 1 week of constipation and abdominal pain.  Found to have SBO, vomited and had aspiration event with worsening respiratory status.  Family confirmed DNR/DNI.  Maintaining saturations on 100% nonrebreather.  PCCM consulted for admission  History of present illness   [name] is an 86 year old female  stage III non-small cell lung cancer with LUL lung mass treated with chemoradiation completed in 2019 and in remission , CAD, hyperlipidemia who presented with 1 week of constipation and abdominal pain.  She has had a bowel obstruction in the past and stated it felt similar.   She underwent a CT abdomen/pelvis with contrast which showed early SBO with transition point in the left midabdomen.  She subsequently had an episode of vomiting and likely aspiration with worsening respiratory status and wide-complex tachycardia.  She was placed on a nonrebreather mask and given amiodarone 150mg  and nitro drip  with improvement in heart rate.  Labs were significant for lactic acid 2.5, NA 129 patient became more altered and was unable to coagulate state whether she wanted to be placed on a ventilator.  Spoke with patient's granddaughter who is power of attorney, she confirmed patient is DNR/DNI.  Surgery also evaluated and recommended NG tube and conservative management.  Past Medical History   has a past medical history of Abdominal aortic atherosclerosis ([place]), CAD (coronary artery disease), Cancer ([place]), Carotid artery occlusion, Common bile duct dilation, Gallstones, Headache(784.0), Hiatal hernia, HLD (hyperlipidemia), Myocardial infarction ([place]), PONV (postoperative nausea and  vomiting), and Stroke ([place]) (2003).   Significant Hospital Events   5/23 admit to PCCM  Consults:  Surgery  Procedures:    Significant Diagnostic Tests:  5/23 CT abdomen pelvis>>Early/incomplete small bowel obstruction with transition point in the left mid abdomen.  Micro Data:  10/12/19 Sars-Cov-2>>pending 10/12/19 BCx2>>  Antimicrobials:  Zosyn 5/23-   Interim history/subjective:  Pt minimally responsive, but hemodynamically stable.  Objective   Blood pressure (!) 143/64, pulse 96, temperature 99.3 F (37.4 C), temperature source Oral, resp. rate (!) 24, height 5\' 1"  (1.549 m), weight 44.5 kg, SpO2 100 %.        Intake/Output Summary (Last 24 hours) at 10/12/2019 2018 Last data filed at 10/12/2019 2011 Gross per 24 hour  Intake 98.06 ml  Output --  Net 98.06 ml   Filed Weights   10/12/19 1414  Weight: 44.5 kg    General: Chronically ill and poorly nourished elderly female HEENT: MM pink/dry Neuro: Moaning, not responding to verbal stimuli following commands CV: s1s2 rr, no m/r/g PULM: Tachypneic, accessory muscles on nonrebreather mask coarse breath sounds throughout GI: soft, bsx4 active  Extremities: warm/dry1+,  edema  Skin: no rashes or lesions   Resolved Hospital Problem list     Assessment & Plan:   Small bowel obstruction Seen by surgery, recommend conservative management -Place NG tube -N.p.o. -Conservative pain management   Acute hypoxic respiratory failure, likely aspiration event Vomited and aspirated in the ED, also has elevated BNP,  pleural effusion and pedal edema granddaughter who is his POA confirmed patient has clearly stated she does not want to be intubated or placed on life support -Continue noninvasive supportive care with nonrebreather mask or high  flow nasal cannula -Trial of Lasix 40 mg    Elevated troponin and BNP, episode of Vtach Sinus tachycardia  after amiodarone 150 mg -Wean nitro drip, start heparin GTT, cards  consulted by the ED -Echocardiogram, trend troponin    Hyponatremia Sodium 129, granddaughter states she has had issues with chronic hyponatremia in the past, levels 135 9 days ago.  Received 500 cc normal saline in the ED -Repeat BMP in 4 hours after IV fluids   History of Stage III non small cell Lung Ca Stable left upper lobe mass on CXR, followed by Dr. Julien Nordmann    *Granddaughter [name] is POA, she feels that patient has recently been expressing readiness to pass away and patient is not improving in the next 24-48 will likely be ready to transition to comfort care    Best practice:  Diet: N.p.o. Pain/Anxiety/Delirium protocol (if indicated): As needed fentanyl VAP protocol (if indicated): N/A DVT prophylaxis: Heparin GI prophylaxis: N/A Glucose control: SSI Mobility: Bedrest Code Status: DNR/DNI Family Communication: Updated multiple family members at the bedside Disposition: ICU  Labs   CBC: Recent Labs  Lab 10/12/19 1503 10/12/19 1941  WBC 7.1  --   HGB 11.3* 12.9  HCT 34.3* 38.0  MCV 91.0  --   PLT 242  --     Basic Metabolic Panel: Recent Labs  Lab 10/12/19 1503 10/12/19 1941  NA 129* 126*  K 4.5 3.8  CL 95*  --   CO2 25  --   GLUCOSE 104*  --   BUN 14  --   CREATININE 0.77  --   CALCIUM 8.9  --    GFR: Estimated Creatinine Clearance: 34.1 mL/min (by C-G formula based on SCr of 0.77 mg/dL). Recent Labs  Lab 10/12/19 1503 10/12/19 1934  WBC 7.1  --   LATICACIDVEN  --  2.5*    Liver Function Tests: Recent Labs  Lab 10/12/19 1503  AST 18  ALT 13  ALKPHOS 172*  BILITOT 1.1  PROT 6.4*  ALBUMIN 2.9*   Recent Labs  Lab 10/12/19 1503  LIPASE 50   No results for input(s): AMMONIA in the last 168 hours.  ABG    Component Value Date/Time   HCO3 23.8 10/12/2019 1941   TCO2 26 10/12/2019 1941   ACIDBASEDEF 4.0 (H) 10/12/2019 1941   O2SAT 90.0 10/12/2019 1941     Coagulation Profile: No results for input(s): INR, PROTIME in the  last 168 hours.  Cardiac Enzymes: No results for input(s): CKTOTAL, CKMB, CKMBINDEX, TROPONINI in the last 168 hours.  HbA1C: No results found for: HGBA1C  CBG: No results for input(s): GLUCAP in the last 168 hours.  Review of Systems:   Unable to obtain secondary to mental status  Past Medical History  She,  has a past medical history of Abdominal aortic atherosclerosis ([place]), CAD (coronary artery disease), Cancer ([place]), Carotid artery occlusion, Common bile duct dilation, Gallstones, Headache(784.0), Hiatal hernia, HLD (hyperlipidemia), Myocardial infarction ([place]), PONV (postoperative nausea and vomiting), and Stroke ([place]) (2003).   Surgical History    Past Surgical History:  Procedure Laterality Date  . ABDOMINAL HYSTERECTOMY    . ANKLE FRACTURE SURGERY    . APPENDECTOMY    . BLADDER SURGERY    . CHOLECYSTECTOMY N/A 05/05/2013   Procedure: LAPAROSCOPIC CHOLECYSTECTOMY WITH INTRAOPERATIVE CHOLANGIOGRAM;  Surgeon: Ralene Ok, MD;  Location: [facility];  Service: General;  Laterality: N/A;  . HIP ARTHROPLASTY Right 01/10/2014   Procedure: RIGHT HIP HEMIARTHROPLASTY;  Surgeon: Doroteo Bradford  Tonita Cong, MD;  Location[facility];  Service: Orthopedics;  Laterality: Right;  DEPUY AML LATERAL WITH MARK II  . ROTATOR CUFF REPAIR Left   . TONSILLECTOMY       Social History   reports that she has been smoking cigarettes. She has been smoking about 0.50 packs per day. She has never used smokeless tobacco. She reports previous alcohol use. She reports that she does not use drugs.   Family History   Her family history includes Breast cancer in her maternal aunt; Prostate cancer in her maternal grandfather; Stroke in her mother. There is no history of Neuropathy.   Allergies Allergies  Allergen Reactions  . Dextroamphetamine Sulfate Er Other (See Comments)    Euphoric feeling     Home Medications  Prior to Admission medications   Medication Sig Start Date End Date Taking? Authorizing Provider   aspirin 81 MG EC tablet Take 81 mg by mouth daily.      [provider]  b complex vitamins capsule Take 1 capsule by mouth daily.    [provider]  Cholecalciferol (VITAMIN D3) 75 MCG (3000 UT) TABS Take by mouth.    [provider]  escitalopram (LEXAPRO) 10 MG tablet Take 10 mg by mouth daily. 02/11/19   [provider]  lisinopril (ZESTRIL) 20 MG tablet  11/14/18   [provider]  Multiple Vitamins-Minerals (MULTIVITAMIN ADULT EXTRA C) CHEW Chew by mouth.    [provider]  protein supplement shake (PREMIER PROTEIN) LIQD Take 2 oz by mouth daily with breakfast.    [provider]  traMADol (ULTRAM) 50 MG tablet Take 50 mg by mouth 5 (five) times daily as needed. 10/06/19   [provider]     Critical care time: 65 minutes       CRITICAL CARE Performed by: Otilio Carpen Antoinette Haskett   Total critical care time: 65 minutes  Critical care time was exclusive of separately billable procedures and treating other patients.  Critical care was necessary to treat or prevent imminent or life-threatening deterioration.  Critical care was time spent personally by me on the following activities: development of treatment plan with patient and/or surrogate as well as nursing, discussions with consultants, evaluation of patient's response to treatment, examination of patient, obtaining history from patient or surrogate, ordering and performing treatments and interventions, ordering and review of laboratory studies, ordering and review of radiographic studies, pulse oximetry and re-evaluation of patient's condition.   Otilio Carpen Matthew Cina, PA-C

## 2019-10-12 NOTE — ED Notes (Signed)
Wasted 1.25 mg Ativan with Kassie Mends, RN as witness

## 2019-10-12 NOTE — ED Notes (Signed)
Oral contrast for CT given to pt. Pt agreed to drink within the hour.

## 2019-10-12 NOTE — Consult Note (Signed)
Surgical Evaluation Requesting provider: Dr. Francia Greaves  Chief Complaint: abdominal pain  HPI: History is taken from chart review as the patient is in respiratory distress and unable to provide information.  86yo woman with multiple medical problems as listed below who presented to [facility] ER today for evaluation of constipation and diffuse abdominal pain.  She reported 1 week of not having a bowel movement, feeling distended with gas pain, but no nausea or vomiting.  No fever.  She does have a history of a suspected bowel obstruction in 2019 which was managed with less than 1 day of nasogastric decompression and quickly resolved.  Surgical history includes laparoscopic cholecystectomy, appendectomy and abdominal hysterectomy.  She has undergone a CT scan with report below, but concerning for possible early mild small bowel obstruction with transition point in the left midabdomen.  On return from CT scan however the patient developed acute respiratory distress.  There was no overt emesis or clear aspiration event but bedside nurse does report that she was spitting up small volumes.  By report her power of attorney is on the way and the patient is a DNR/DNI-this is being confirmed.  Initial labs notable for hyponatremia, normal bicarb, normal creatinine, white blood count 7, hemoglobin 11  Allergies  Allergen Reactions  . Dextroamphetamine Sulfate Er Other (See Comments)    Euphoric feeling    Past Medical History:  Diagnosis Date  . Abdominal aortic atherosclerosis ([place])   . CAD (coronary artery disease)   . Cancer ([place])   . Carotid artery occlusion   . Common bile duct dilation   . Gallstones   . Headache(784.0)   . Hiatal hernia   . HLD (hyperlipidemia)   . Myocardial infarction ([place])   . PONV (postoperative nausea and vomiting)   . Stroke [facility]) 2003    Past Surgical History:  Procedure Laterality Date  . ABDOMINAL HYSTERECTOMY    . ANKLE FRACTURE SURGERY    . APPENDECTOMY    .  BLADDER SURGERY    . CHOLECYSTECTOMY N/A 05/05/2013   Procedure: LAPAROSCOPIC CHOLECYSTECTOMY WITH INTRAOPERATIVE CHOLANGIOGRAM;  Surgeon: Ralene Ok, MD;  Location: [place];  Service: General;  Laterality: N/A;  . HIP ARTHROPLASTY Right 01/10/2014   Procedure: RIGHT HIP HEMIARTHROPLASTY;  Surgeon: Johnn Hai, MD;  Location: [facility];  Service: Orthopedics;  Laterality: Right;  DEPUY AML LATERAL WITH MARK II  . ROTATOR CUFF REPAIR Left   . TONSILLECTOMY      Family History  Problem Relation Age of Onset  . Stroke Mother   . Breast cancer Maternal Aunt   . Prostate cancer Maternal Grandfather   . Neuropathy Neg Hx     Social History   Socioeconomic History  . Marital status: Widowed    Spouse name: Not on file  . Number of children: 3  . Years of education: Not on file  . Highest education level: Not on file  Occupational History  . Occupation: Retired  Tobacco Use  . Smoking status: Current Every Day Smoker    Packs/day: 0.50    Types: Cigarettes  . Smokeless tobacco: Never Used  Substance and Sexual Activity  . Alcohol use: Not Currently  . Drug use: No  . Sexual activity: Not on file  Other Topics Concern  . Not on file  Social History Narrative   The patient is widowed with 3 adult sons   Retired   2-3 cups of caffeine daily   Originally from [place]   03/20/2013  Lives alone   Right handed   Social Determinants of Health   Financial Resource Strain:   . Difficulty of Paying Living Expenses:   Food Insecurity:   . Worried About Charity fundraiser in the Last Year:   . Arboriculturist in the Last Year:   Transportation Needs:   . Film/video editor (Medical):   Marland Kitchen Lack of Transportation (Non-Medical):   Physical Activity:   . Days of Exercise per Week:   . Minutes of Exercise per Session:   Stress:   . Feeling of Stress :   Social Connections:   . Frequency of Communication with Friends and Family:   . Frequency of Social Gatherings with  Friends and Family:   . Attends Religious Services:   . Active Member of Clubs or Organizations:   . Attends Archivist Meetings:   Marland Kitchen Marital Status:     No current facility-administered medications on file prior to encounter.   Current Outpatient Medications on File Prior to Encounter  Medication Sig Dispense Refill  . aspirin 81 MG EC tablet Take 81 mg by mouth daily.      Marland Kitchen b complex vitamins capsule Take 1 capsule by mouth daily.    . Cholecalciferol (VITAMIN D3) 75 MCG (3000 UT) TABS Take by mouth.    . escitalopram (LEXAPRO) 10 MG tablet Take 10 mg by mouth daily.    Marland Kitchen lisinopril (ZESTRIL) 20 MG tablet     . Multiple Vitamins-Minerals (MULTIVITAMIN ADULT EXTRA C) CHEW Chew by mouth.    . protein supplement shake (PREMIER PROTEIN) LIQD Take 2 oz by mouth daily with breakfast.    . traMADol (ULTRAM) 50 MG tablet Take 50 mg by mouth 5 (five) times daily as needed.      Review of Systems: a complete, 10pt review of systems was completed with pertinent positives and negatives as documented in the HPI  Physical Exam: Vitals:   10/12/19 1950 10/12/19 1953  BP: (!) 168/88 (!) 162/87  Pulse: (!) 110 (!) 109  Resp: (!) 31 (!) 26  Temp:    SpO2: 100% 100%   Gen: Alert but nonconversant and in distress.  Cachectic elderly female Eyes: lids and conjunctivae normal, no icterus. Pupils equally round and reactive to light.  Neck: no mass or thyromegaly Chest: respiratory effort is labored and rhonchorous Cardiovascular: RRR with palpable distal pulses, no pedal edema Gastrointestinal: soft, mildly distended, nontender. No mass, hepatomegaly or splenomegaly.  Lymphatic: no lymphadenopathy in the neck or groin Muscoloskeletal: no clubbing or cyanosis of the fingers     CBC Latest Ref Rng & Units 10/12/2019 10/12/2019 10/03/2019  WBC 4.0 - 10.5 K/uL - 7.1 5.4  Hemoglobin 12.0 - 15.0 g/dL 12.9 11.3(L) 11.3(L)  Hematocrit 36.0 - 46.0 % 38.0 34.3(L) 34.6(L)  Platelets 150 - 400  K/uL - 242 160    CMP Latest Ref Rng & Units 10/12/2019 10/12/2019 10/03/2019  Glucose 70 - 99 mg/dL - 104(H) 97  BUN 8 - 23 mg/dL - 14 26(H)  Creatinine 0.44 - 1.00 mg/dL - 0.77 1.22(H)  Sodium 135 - 145 mmol/L 126(L) 129(L) 135  Potassium 3.5 - 5.1 mmol/L 3.8 4.5 4.0  Chloride 98 - 111 mmol/L - 95(L) 99  CO2 22 - 32 mmol/L - 25 27  Calcium 8.9 - 10.3 mg/dL - 8.9 8.8(L)  Total Protein 6.5 - 8.1 g/dL - 6.4(L) 6.7  Total Bilirubin 0.3 - 1.2 mg/dL - 1.1 0.6  Alkaline Phos 38 -  126 U/L - 172(H) 142(H)  AST 15 - 41 U/L - 18 22  ALT 0 - 44 U/L - 13 18    Lab Results  Component Value Date   INR 1.04 04/03/2017   INR 0.98 01/09/2014    Imaging: CT Abdomen Pelvis W Contrast  Result Date: 10/12/2019 CLINICAL DATA:  Suspected bowel obstruction. EXAM: CT ABDOMEN AND PELVIS WITH CONTRAST TECHNIQUE: Multidetector CT imaging of the abdomen and pelvis was performed using the standard protocol following bolus administration of intravenous contrast. CONTRAST:  113mL OMNIPAQUE IOHEXOL 300 MG/ML  SOLN COMPARISON:  None. FINDINGS: Lower chest: Left pleural effusion. Enlarged heart. Atherosclerotic disease of the aorta with calcified and noncalcified plaque. Hepatobiliary: No focal liver abnormality is seen. Status post cholecystectomy. No biliary dilatation. Pancreas: Unremarkable. No pancreatic ductal dilatation or surrounding inflammatory changes. Spleen: Normal in size without focal abnormality. Adrenals/Urinary Tract: Adrenal glands are unremarkable. Kidneys are normal, without renal calculi, focal lesion, or hydronephrosis. Bladder is unremarkable. Stomach/Bowel: Normal appearance of the stomach and proximal small bowel. Fluid-filled dilated distal small bowel loops measuring up to 4.1 cm transversely. Transitional point is identified in the left mid abdomen. Residual gas and stool in the colon. Vascular/Lymphatic: Aortic atherosclerosis. No enlarged abdominal or pelvic lymph nodes. Reproductive: Status  post hysterectomy. No adnexal masses. Other: No abdominal wall hernia or abnormality. No abdominopelvic ascites. Musculoskeletal: Moderate in severity compression fracture of T10 vertebral body, which was not present on the chest CT dated Oct 03, 2019 chronic T11 compression fracture. IMPRESSION: 1. Early/incomplete small bowel obstruction with transition point in the left mid abdomen. 2. Moderate in severity compression fracture of T10 vertebral body, new from Oct 03, 2019. 3. Stable chronic T12 compression fracture. 4. Left pleural effusion. 5. Enlarged heart. Aortic Atherosclerosis (ICD10-I70.0). Electronically Signed   By: Fidela Salisbury M.D.   On: 10/12/2019 18:57   DG Chest Port 1 View  Result Date: 10/12/2019 CLINICAL DATA:  Shortness of breath EXAM: PORTABLE CHEST 1 VIEW COMPARISON:  CT scan Oct 03, 2019 FINDINGS: Opacity in the left upper lobe is at the site of the patient's known treated malignancy and likely represents post treatment change. Probable small left effusion. Mild edema identified. No other acute abnormalities. IMPRESSION: 1. Post treatment changes in the left upper lung. Mild edema and small left effusion. Electronically Signed   By: Dorise Bullion III M.D   On: 10/12/2019 19:16     A/P: 86 year old woman with multiple medical problems and now with acute respiratory failure.  She is being evaluated by the critical care team but it sounds as though noninvasive measures will be required given her code status Early partial SBO: Not clear that this is necessarily an adhesive obstruction, may also be secondary to ileus or motility issue as her entire colon is also somewhat dilated and full of stool and air.  Recommend NG decompression, repeat abdominal plain film in AM to assess progress of oral contrast given for CT. No plans for surgery at this time, will continue to follow    Patient Active Problem List   Diagnosis Date Noted  . Drug-induced polyneuropathy ([place]) 09/30/2019   . Sensorineural hearing loss (SNHL), bilateral 02/13/2018  . Malnutrition of moderate degree 01/08/2018  . SBO (small bowel obstruction) ([place]) 01/06/2018  . Community acquired pneumonia   . Hypercalcemia 05/21/2017  . MDD (major depressive disorder), recurrent episode, moderate ([place]) 05/11/2017  . Dehydration 05/10/2017  . Weakness 05/10/2017  . Encounter for antineoplastic chemotherapy 05/04/2017  . Goals  of care, counseling/discussion 05/04/2017  . Malignant neoplasm of bronchus of left upper lobe ([place]) 05/03/2017  . Chest congestion 04/03/2017  . Constipation 04/03/2017  . Adenoma of left adrenal gland 04/03/2017  . SIADH (syndrome of inappropriate ADH production) ([place]) 04/02/2017  . Lung mass 04/02/2017  . Elevated AST (SGOT) 04/02/2017  . Leukocytosis 04/02/2017  . Normocytic anemia 04/02/2017  . Cerumen impaction 11/19/2015  . Hip fracture requiring operative repair ([place]) 01/09/2014  . Closed right hip fracture ([place]) 01/09/2014  . Hyponatremia 01/09/2014  . Symptomatic cholelithiasis 03/20/2013  . Dilated bile duct 03/20/2013  . Abdominal aortic atherosclerosis ([place])   . Hypertension 10/19/2011  . Tobacco abuse 02/18/2009  . HLD (hyperlipidemia) 02/17/2009  . ANXIETY DISORDER 02/17/2009  . CAD (coronary artery disease) 02/17/2009  . CAROTID ARTERY OCCLUSION 02/17/2009  . CVA 02/17/2009       Romana Juniper, MD [facility]  See AMION to contact appropriate on-call provider

## 2019-10-12 NOTE — Progress Notes (Signed)
88 yeeLink Physician-Brief Progress Note Patient Name: [name] DOB: May 09, 1929 MRN: [id number]   Date of Service  10/12/2019  HPI/Events of Note  85 year old woman with SBO, acute hypoxic resp failure, hypertension and tachycardia. Seen by Surgery, PCCM and cardiology consulted  eICU Interventions  Seen on camera, currently comfortable Admission done by pCCM  Please call if needed     Intervention Category Major Interventions: Respiratory failure - evaluation and management Minor Interventions: Other: Evaluation Type: New Patient Evaluation  Margaretmary Lombard 10/12/2019, 11:21 PM

## 2019-10-12 NOTE — ED Notes (Signed)
Pt drank all oral contrast solution

## 2019-10-12 NOTE — Progress Notes (Signed)
RT attempted to place patient on BIPAP. Patient unable to tolerate BIPAP mask at this time. MD is aware. Patient on 100% NRB at this time. RT will continue to monitor.

## 2019-10-12 NOTE — ED Notes (Signed)
[name] Daughter [phone] looking for an update on the pt

## 2019-10-12 NOTE — ED Triage Notes (Signed)
Pt states she is constipated and believes she has a bowel blockage.  Reports pain all over.  Last BM last week.

## 2019-10-12 NOTE — Progress Notes (Signed)
[place] for Heparin Indication: chest pain/ACS  Allergies  Allergen Reactions  . Dextroamphetamine Sulfate Er Other (See Comments)    Euphoric feeling    Patient Measurements: Height: 5\' 1"  (154.9 cm) Weight: 44.5 kg (98 lb) IBW/kg (Calculated) : 47.8 Heparin Dosing Weight: 44.5 kg  Vital Signs: Temp: 99.3 F (37.4 C) (05/23 1459) Temp Source: Oral (05/23 1459) BP: 135/57 (05/23 2030) Pulse Rate: 86 (05/23 2030)  Labs: Recent Labs    10/12/19 1503 10/12/19 1934 10/12/19 1941  HGB 11.3*  --  12.9  HCT 34.3*  --  38.0  PLT 242  --   --   CREATININE 0.77  --   --   TROPONINIHS  --  100*  --     Estimated Creatinine Clearance: 34.1 mL/min (by C-G formula based on SCr of 0.77 mg/dL).   Medical History: Past Medical History:  Diagnosis Date  . Abdominal aortic atherosclerosis ([place])   . CAD (coronary artery disease)   . Cancer ([place])   . Carotid artery occlusion   . Common bile duct dilation   . Gallstones   . Headache(784.0)   . Hiatal hernia   . HLD (hyperlipidemia)   . Myocardial infarction ([place])   . PONV (postoperative nausea and vomiting)   . Stroke [facility]) 2003    Medications:  Scheduled:  . furosemide  40 mg Intravenous Q12H  . heparin  2,500 Units Intravenous Once  . ondansetron (ZOFRAN) IV  4 mg Intravenous Once  . sodium chloride flush  3 mL Intravenous Once    Assessment: Patient is a 14 yof that presented to the ED with complaints of constipation and found to have an early SBO. After CT the patient had an episode of wide complex tachycardia and decreased respirations. The patient was given an amio bolus and nitro drip which improved the patient. Pharmacy has been asked to start heparin for ACS as the patient does have an elevated trop at this time.   Goal of Therapy:  Heparin level 0.3-0.7 units/ml Monitor platelets by anticoagulation protocol: Yes   Plan:  - Heparin bolus 2500 units IV x 1 dose -  Heparin drip @ 550 units/hr - Heparin level in ~ 8 hours  - Monitor patient for s/s of bleeding and CBC while on heparin   Duanne Limerick PharmD. BCPS  10/12/2019,9:21 PM

## 2019-10-13 ENCOUNTER — Inpatient Hospital Stay (HOSPITAL_COMMUNITY): Payer: Medicare Other

## 2019-10-13 DIAGNOSIS — I214 Non-ST elevation (NSTEMI) myocardial infarction: Secondary | ICD-10-CM

## 2019-10-13 DIAGNOSIS — Z66 Do not resuscitate: Secondary | ICD-10-CM

## 2019-10-13 DIAGNOSIS — J690 Pneumonitis due to inhalation of food and vomit: Secondary | ICD-10-CM

## 2019-10-13 DIAGNOSIS — L8990 Pressure ulcer of unspecified site, unspecified stage: Secondary | ICD-10-CM | POA: Insufficient documentation

## 2019-10-13 DIAGNOSIS — R531 Weakness: Secondary | ICD-10-CM

## 2019-10-13 DIAGNOSIS — I10 Essential (primary) hypertension: Secondary | ICD-10-CM

## 2019-10-13 DIAGNOSIS — R609 Edema, unspecified: Secondary | ICD-10-CM

## 2019-10-13 DIAGNOSIS — C3412 Malignant neoplasm of upper lobe, left bronchus or lung: Secondary | ICD-10-CM

## 2019-10-13 DIAGNOSIS — K56609 Unspecified intestinal obstruction, unspecified as to partial versus complete obstruction: Secondary | ICD-10-CM

## 2019-10-13 DIAGNOSIS — E871 Hypo-osmolality and hyponatremia: Secondary | ICD-10-CM

## 2019-10-13 DIAGNOSIS — J9601 Acute respiratory failure with hypoxia: Secondary | ICD-10-CM

## 2019-10-13 DIAGNOSIS — R627 Adult failure to thrive: Secondary | ICD-10-CM

## 2019-10-13 DIAGNOSIS — Z515 Encounter for palliative care: Secondary | ICD-10-CM

## 2019-10-13 LAB — BASIC METABOLIC PANEL
Anion gap: 11 (ref 5–15)
BUN: 14 mg/dL (ref 8–23)
CO2: 26 mmol/L (ref 22–32)
Calcium: 8.4 mg/dL — ABNORMAL LOW (ref 8.9–10.3)
Chloride: 94 mmol/L — ABNORMAL LOW (ref 98–111)
Creatinine, Ser: 1.04 mg/dL — ABNORMAL HIGH (ref 0.44–1.00)
GFR calc Af Amer: 56 mL/min — ABNORMAL LOW (ref >60)
GFR calc non Af Amer: 48 mL/min — ABNORMAL LOW (ref >60)
Glucose, Bld: 102 mg/dL — ABNORMAL HIGH (ref 70–99)
Potassium: 4 mmol/L (ref 3.5–5.1)
Sodium: 131 mmol/L — ABNORMAL LOW (ref 135–145)

## 2019-10-13 LAB — URINALYSIS, ROUTINE W REFLEX MICROSCOPIC
Bacteria, UA: NONE SEEN
Bilirubin Urine: NEGATIVE
Glucose, UA: NEGATIVE mg/dL
Hgb urine dipstick: NEGATIVE
Ketones, ur: NEGATIVE mg/dL
Leukocytes,Ua: NEGATIVE
Nitrite: NEGATIVE
Protein, ur: 100 mg/dL — AB
Specific Gravity, Urine: 1.016 (ref 1.005–1.030)
pH: 6 (ref 5.0–8.0)

## 2019-10-13 LAB — CBC
HCT: 31.9 % — ABNORMAL LOW (ref 36.0–46.0)
Hemoglobin: 10.5 g/dL — ABNORMAL LOW (ref 12.0–15.0)
MCH: 30.7 pg (ref 26.0–34.0)
MCHC: 32.9 g/dL (ref 30.0–36.0)
MCV: 93.3 fL (ref 80.0–100.0)
Platelets: 203 10*3/uL (ref 150–400)
RBC: 3.42 MIL/uL — ABNORMAL LOW (ref 3.87–5.11)
RDW: 13.7 % (ref 11.5–15.5)
WBC: 7.4 10*3/uL (ref 4.0–10.5)
nRBC: 0 % (ref 0.0–0.2)

## 2019-10-13 LAB — GLUCOSE, CAPILLARY
Glucose-Capillary: 109 mg/dL — ABNORMAL HIGH (ref 70–99)
Glucose-Capillary: 86 mg/dL (ref 70–99)

## 2019-10-13 LAB — TROPONIN I (HIGH SENSITIVITY)
Troponin I (High Sensitivity): 1882 ng/L (ref <18)
Troponin I (High Sensitivity): 1544 ng/L (ref <18)

## 2019-10-13 LAB — HEPARIN LEVEL (UNFRACTIONATED)
Heparin Unfractionated: 0.24 IU/mL — ABNORMAL LOW (ref 0.30–0.70)
Heparin Unfractionated: 0.2 IU/mL — ABNORMAL LOW (ref 0.30–0.70)

## 2019-10-13 LAB — MRSA PCR SCREENING: MRSA by PCR: NEGATIVE

## 2019-10-13 LAB — LACTIC ACID, PLASMA: Lactic Acid, Venous: 1.1 mmol/L (ref 0.5–1.9)

## 2019-10-13 MED ORDER — SODIUM CHLORIDE 0.9 % IV SOLN
3.0000 g | Freq: Two times a day (BID) | INTRAVENOUS | Status: DC
  Administered 2019-10-13: 3 g via INTRAVENOUS
  Filled 2019-10-13: qty 8

## 2019-10-13 MED ORDER — LABETALOL HCL 5 MG/ML IV SOLN
10.0000 mg | Freq: Four times a day (QID) | INTRAVENOUS | Status: DC
  Administered 2019-10-13 (×3): 10 mg via INTRAVENOUS
  Filled 2019-10-13 (×5): qty 4

## 2019-10-13 MED ORDER — MORPHINE SULFATE (CONCENTRATE) 10 MG/0.5ML PO SOLN
5.0000 mg | ORAL | Status: DC | PRN
  Administered 2019-10-13 (×2): 5 mg via ORAL
  Filled 2019-10-13 (×2): qty 0.5

## 2019-10-13 MED ORDER — LORAZEPAM 1 MG PO TABS
1.0000 mg | ORAL_TABLET | Freq: Four times a day (QID) | ORAL | Status: DC | PRN
  Administered 2019-10-13: 1 mg via ORAL
  Filled 2019-10-13: qty 1

## 2019-10-13 MED ORDER — SODIUM CHLORIDE 0.9 % IV SOLN: INTRAVENOUS | Status: DC

## 2019-10-13 MED ORDER — TRAMADOL HCL 50 MG PO TABS: 50.0000 mg | ORAL_TABLET | Freq: Every day | ORAL | Status: DC | PRN

## 2019-10-13 MED ORDER — ASPIRIN 81 MG PO CHEW
81.0000 mg | CHEWABLE_TABLET | Freq: Every day | ORAL | Status: DC
  Administered 2019-10-13 – 2019-10-14 (×2): 81 mg via ORAL
  Filled 2019-10-13 (×3): qty 1

## 2019-10-13 NOTE — Evaluation (Signed)
Clinical/Bedside Swallow Evaluation Patient Details  Name: [name] MRN: [id number] Date of Birth: 21-Apr-1929  Today's Date: 10/13/2019 Time: SLP Start Time (ACUTE ONLY): 1337 SLP Stop Time (ACUTE ONLY): 0258 SLP Time Calculation (min) (ACUTE ONLY): 10 min  Past Medical History:  Past Medical History:  Diagnosis Date  . Abdominal aortic atherosclerosis ([place])   . CAD (coronary artery disease)   . Cancer ([place])   . Carotid artery occlusion   . Common bile duct dilation   . Gallstones   . Headache(784.0)   . Hiatal hernia   . HLD (hyperlipidemia)   . Myocardial infarction ([place])   . PONV (postoperative nausea and vomiting)   . Stroke [facility]) 2003   Past Surgical History:  Past Surgical History:  Procedure Laterality Date  . ABDOMINAL HYSTERECTOMY    . ANKLE FRACTURE SURGERY    . APPENDECTOMY    . BLADDER SURGERY    . CHOLECYSTECTOMY N/A 05/05/2013   Procedure: LAPAROSCOPIC CHOLECYSTECTOMY WITH INTRAOPERATIVE CHOLANGIOGRAM;  Surgeon: Ralene Ok, MD;  Location: [place];  Service: General;  Laterality: N/A;  . HIP ARTHROPLASTY Right 01/10/2014   Procedure: RIGHT HIP HEMIARTHROPLASTY;  Surgeon: Johnn Hai, MD;  [facility];  Service: Orthopedics;  Laterality: Right;  DEPUY AML LATERAL WITH MARK II  . ROTATOR CUFF REPAIR Left   . TONSILLECTOMY     HPI:  86 year old female medical history of stage III non-small cell lung cancer in remission, CAD, hyperlipidemia who presented with one week of constipation and abdominal pain.  Found to have SBO, vomited and had aspiration event with worsening respiratory status.  Bowel functioning improving, clear liquids started 5/24. Palliative consult pending.    Assessment / Plan / Recommendation Clinical Impression  Pt participated in brief swallowing evaluation, limited to clear liquids due to SBO.  Demonstrated normal oral mechanism exam, adequate oral attention and manipulation, brisk swallow response, and no s/s of  aspiration with sips of water.  There is adequate synchrony of swallow/respiratory parameters.  After session completed, new entry in chart indicated that pt is transitioning to comfort care.  Pt's swallow function looks fine; she should be comfortable eating/drinking per her preferences from a biomechanic standpoint re: swallowing.  Our service will sign off.  SLP Visit Diagnosis: Dysphagia, unspecified (R13.10)    Aspiration Risk       Diet Recommendation   advance per MD/palliative medicine  Medication Administration: Whole meds with liquid    Other  Recommendations Oral Care Recommendations: Oral care BID   Follow up Recommendations None        Swallow Study   General Date of Onset: 10/12/19 HPI: 86 year old female medical history of stage III non-small cell lung cancer in remission, CAD, hyperlipidemia who presented with one week of constipation and abdominal pain.  Found to have SBO, vomited and had aspiration event with worsening respiratory status.  Bowel functioning improving, clear liquids started 5/24. Palliative consult pending.  Type of Study: Bedside Swallow Evaluation Diet Prior to this Study: Thin liquids Temperature Spikes Noted: No Respiratory Status: Nasal cannula History of Recent Intubation: No Behavior/Cognition: Alert;Cooperative;Pleasant mood Oral Cavity Assessment: Within Functional Limits Oral Care Completed by SLP: No Oral Cavity - Dentition: Dentures, top Vision: Functional for self-feeding Self-Feeding Abilities: Able to feed self Patient Positioning: Upright in bed Baseline Vocal Quality: Hoarse Volitional Cough: Strong Volitional Swallow: Able to elicit    Oral/Motor/Sensory Function Overall Oral Motor/Sensory Function: Within functional limits   Ice Chips Ice chips: Within functional limits  Thin Liquid Thin Liquid: Within functional limits    Nectar Thick Nectar Thick Liquid: Not tested   Honey Thick Honey Thick Liquid: Not tested   Puree  Puree: Not tested   Solid     Solid: Not tested      Juan Quam Laurice 10/13/2019,1:53 PM   Estill Bamberg L. Tivis Ringer, [place] Office number [phone] Pager [phone]

## 2019-10-13 NOTE — Progress Notes (Signed)
Pt not voiding and states she doesn't feel like she needs to void. Attempted to insert foley x2 due to large amount of urine retention within a short time frame. Despite 2 nurses trying, still unable to successfully pass foley catheter. Ended up doing a straight cath instead due to more rigid catheter. Intermittent straight cath volume was 671mls.

## 2019-10-13 NOTE — Progress Notes (Signed)
0.60ml of Morphine 10mg /0.35ml wasted in stericycle with Azzie Almas, RN.

## 2019-10-13 NOTE — Progress Notes (Signed)
Bilateral lower extremity venous duplex has been completed. Preliminary results can be found in CV Proc through chart review.  Results were given to the patient's nurse, Lattie Haw.  10/13/19 10:36 AM Carlos Levering RVT

## 2019-10-13 NOTE — Progress Notes (Signed)
[place] Progress Note Patient Name: NAVI EWTON DOB: 09-Jan-1929 MRN: [id number]   Date of Service  10/13/2019  HPI/Events of Note  Urinary retention  eICU Interventions  Already straight cath done x 1 Foley ordered     Intervention Category Minor Interventions: Other:  Margaretmary Lombard 10/13/2019, 4:50 AM

## 2019-10-13 NOTE — Consult Note (Signed)
Consultation Note Date: 10/13/2019   Patient Name: [name]  DOB: 05/01/1929  MRN: [id number]  Age / Sex: 86 y.o., female  PCP: Leonard Downing, MD Referring Physician: Julian Hy, DO  Reason for Consultation: Establishing goals of care and Psychosocial/spiritual support  HPI/Patient Profile: 86 y.o. female   admitted on 10/12/2019 with past medical history of stage III non-small cell lung cancer/ treated with chemoradiation completed in 2019 / in remission, CAD, hyperlipidemia who presented with 1 week of constipation and abdominal pain.  Found to have SBO, vomited and had aspiration event with worsening respiratory status.  Family confirmed DNR/DNI.  She underwent a CT abdomen/pelvis with contrast which showed early SBO with transition point in the left midabdomen.  She subsequently had an episode of vomiting and likely aspiration with worsening respiratory status and wide-complex tachycardia.    She was placed on a nonrebreather mask and given amiodarone 150mg  and nitro drip  with improvement in heart rate.  Labs were significant for lactic acid 2.5, NA 129 patient became more altered and was unable to coagulate state whether she wanted to be placed on a ventilator.  Spoke with patient's granddaughter who is power of attorney, she confirmed patient is DNR/DNI.    Patient is currently in MICU for treatment and stabilization until family makes decision regarding goals of care.  Patient and family face treatment option decisions, advanced directive decisions and anticipatory care needs.  Clinical Assessment and Goals of Care:   This NP Wadie Lessen reviewed medical records, received report from team, assessed the patient and then spoke with grand-daughter/ [name]/HPOA from  the patient's bedside  to discuss diagnosis, prognosis, GOC, EOL wishes disposition and options.   Concept of  Palliative Care introduced and its role and holistic treatment plan     A  discussion was had today regarding advanced directives.  Concepts specific to code status, artifical feeding and hydration, continued IV antibiotics and rehospitalization was had.  The difference between a aggressive medical intervention path  and a palliative comfort care path for this patient at this time was had.  Values and goals of care important to patient and family were attempted to be elicited.   MOST form  discussed  Granddaughter very clearly verbalizes her knowing that her grandmother's wishes are to focus fully on comfort.  She desires no life prolonging measures and wishes to allow for a natural death.  Patient is clear "I am ready to die"  Plan of care: -DNR/DNI -No artificial feeding or hydration now or in the future/liquid diet as tolerated -No further diagnostics; scans, labs, tests -No life prolonging measures, no antibiotics, no transfusions -Minimize medication to those only enhancing comfort, no IV medications -Comfort, quality and dignity are the main focus of care--allow for a natural death -Transfer patient to 6 N with comfort his focus of care and reevaluate in the morning for appropriate transition of care. -Reevaluate in the morning for transition of care.  Family is leaning towards home with hospice services  Education offered on the difference between an aggressive medical intervention path and a palliative comfort path for this patient at this time in this situation.    Natural trajectory and expectations at EOL were discussed.  Questions and concerns addressed.     PMT will continue to support holistically.     HCPOA/ [name] /granddaughter    SUMMARY OF RECOMMENDATIONS    Code Status/Advance Care Planning:  DNR   Full comfort-focus of care is comfort quality and dignity   Symptom Management:   Pain/dyspnea-Roxanol 5 mg p.o./sublingual every 2 hours as  needed  Agitation-Ativan 1 mg p.o./sublingual every 4 hours as needed  Palliative Prophylaxis:   Aspiration, Bowel Regimen, Delirium Protocol, Frequent Pain Assessment and Oral Care  Additional Recommendations (Limitations, Scope, Preferences):  Full Scope Treatment  Psycho-social/Spiritual:   Desire for further Chaplaincy support:no  Additional Recommendations: Education on Hospice/detailed hospice benefit both in the home and inpatient residential facilities  Prognosis:   Unable to determine  Discharge Planning:   To Be Determined      Primary Diagnoses: Present on Admission: . Bowel obstruction ([place])   I have reviewed the medical record, interviewed the patient and family, and examined the patient. The following aspects are pertinent.  Past Medical History:  Diagnosis Date  . Abdominal aortic atherosclerosis ([place])   . CAD (coronary artery disease)   . Cancer ([place])   . Carotid artery occlusion   . Common bile duct dilation   . Gallstones   . Headache(784.0)   . Hiatal hernia   . HLD (hyperlipidemia)   . Myocardial infarction ([place])   . PONV (postoperative nausea and vomiting)   . Stroke [facility]) 2003   Social History   Socioeconomic History  . Marital status: Widowed    Spouse name: Not on file  . Number of children: 3  . Years of education: Not on file  . Highest education level: Not on file  Occupational History  . Occupation: Retired  Tobacco Use  . Smoking status: Current Every Day Smoker    Packs/day: 0.50    Types: Cigarettes  . Smokeless tobacco: Never Used  Substance and Sexual Activity  . Alcohol use: Not Currently  . Drug use: No  . Sexual activity: Not on file  Other Topics Concern  . Not on file  Social History Narrative   The patient is widowed with 3 adult sons   Retired   2-3 cups of caffeine daily   Originally from [place]   03/20/2013      Lives alone   Right handed   Social Determinants of Health   Financial Resource  Strain:   . Difficulty of Paying Living Expenses:   Food Insecurity:   . Worried About Charity fundraiser in the Last Year:   . Arboriculturist in the Last Year:   Transportation Needs:   . Film/video editor (Medical):   Marland Kitchen Lack of Transportation (Non-Medical):   Physical Activity:   . Days of Exercise per Week:   . Minutes of Exercise per Session:   Stress:   . Feeling of Stress :   Social Connections:   . Frequency of Communication with Friends and Family:   . Frequency of Social Gatherings with Friends and Family:   . Attends Religious Services:   . Active Member of Clubs or Organizations:   . Attends Archivist Meetings:   Marland Kitchen Marital Status:    Family History  Problem Relation Age of  Onset  . Stroke Mother   . Breast cancer Maternal Aunt   . Prostate cancer Maternal Grandfather   . Neuropathy Neg Hx    Scheduled Meds: . aspirin  81 mg Oral Daily  . Chlorhexidine Gluconate Cloth  6 each Topical Q0600  . labetalol  10 mg Intravenous Q6H  . mouth rinse  15 mL Mouth Rinse BID  . ondansetron (ZOFRAN) IV  4 mg Intravenous Once  . sodium chloride flush  3 mL Intravenous Once   Continuous Infusions: . sodium chloride 10 mL/hr at 10/13/19 0800  . ampicillin-sulbactam (UNASYN) IV 3 g (10/13/19 0916)  . heparin 650 Units/hr (10/13/19 0800)   PRN Meds:.docusate sodium, fentaNYL (SUBLIMAZE) injection, polyethylene glycol, traMADol Medications Prior to Admission:  Prior to Admission medications   Medication Sig Start Date End Date Taking? Authorizing Provider  aspirin 81 MG EC tablet Take 81 mg by mouth daily.      [provider]  b complex vitamins capsule Take 1 capsule by mouth daily.    [provider]  Cholecalciferol (VITAMIN D3) 75 MCG (3000 UT) TABS Take by mouth.    [provider]  escitalopram (LEXAPRO) 10 MG tablet Take 10 mg by mouth daily. 02/11/19   [provider]  lisinopril (ZESTRIL) 20 MG tablet Take 20 mg by  mouth daily.  11/14/18   [provider]  Multiple Vitamins-Minerals (MULTIVITAMIN ADULT EXTRA C) CHEW Chew by mouth.    [provider]  protein supplement shake (PREMIER PROTEIN) LIQD Take 2 oz by mouth daily with breakfast.    [provider]  traMADol (ULTRAM) 50 MG tablet Take 50 mg by mouth 5 (five) times daily as needed. 10/06/19   [provider]   Allergies  Allergen Reactions  . Dextroamphetamine Sulfate Er Other (See Comments)    Euphoric feeling   Review of Systems  Unable to perform ROS: Other    Physical Exam Cardiovascular:     Rate and Rhythm: Normal rate.  Musculoskeletal:     Comments: Generalized weakness and muscle atrophy  Skin:    General: Skin is warm and dry.  Neurological:     Mental Status: She is alert.     Vital Signs: BP (!) 165/64   Pulse 70   Temp (!) 96.8 F (36 C) (Axillary)   Resp 20   Ht 5\' 1"  (1.549 m)   Wt 46.1 kg   SpO2 92%   BMI 19.20 kg/m  Pain Scale: 0-10   Pain Score: 0-No pain   SpO2: SpO2: 92 % O2 Device:SpO2: 92 % O2 Flow Rate: .O2 Flow Rate (L/min): 1 L/min  IO: Intake/output summary:   Intake/Output Summary (Last 24 hours) at 10/13/2019 1246 Last data filed at 10/13/2019 0800 Gross per 24 hour  Intake 299.71 ml  Output 1500 ml  Net -1200.29 ml    LBM: Last BM Date: 10/12/19 Baseline Weight: Weight: 44.5 kg Most recent weight: Weight: 46.1 kg     Palliative Assessment/Data: 30 %   Discussed with Samantha Hicks/CCM  Time In: 1130 Time Out: 1245 Time Total: 70 minutes Greater than 50%  of this time was spent counseling and coordinating care related to the above assessment and plan.  Signed by: Wadie Lessen, NP   Please contact Palliative Medicine Team phone at [phone] for questions and concerns.  For individual provider: See Shea Evans

## 2019-10-13 NOTE — Progress Notes (Signed)
NAME:  [name], MRN:  [id number], DOB:  1928-07-23, LOS: 1 ADMISSION DATE:  10/12/2019, CONSULTATION DATE:  10/13/19 REFERRING MD:  EDP, CHIEF COMPLAINT:  Abdominal pain    Brief History   86 year old female medical history of stage III non-small cell lung cancer in remission, CAD, hyperlipidemia who presented with 1 week of constipation and abdominal pain.  Found to have SBO, vomited and had aspiration event with worsening respiratory status.  Family confirmed DNR/DNI.  Maintaining saturations on 100% nonrebreather.  PCCM consulted for admission  History of present illness   [name] is an 86 year old female  stage III non-small cell lung cancer with LUL lung mass treated with chemoradiation completed in 2019 and in remission , CAD, hyperlipidemia who presented with 1 week of constipation and abdominal pain.  She has had a bowel obstruction in the past and stated it felt similar.   She underwent a CT abdomen/pelvis with contrast which showed early SBO with transition point in the left midabdomen.  She subsequently had an episode of vomiting and likely aspiration with worsening respiratory status and wide-complex tachycardia.  She was placed on a nonrebreather mask and given amiodarone 150mg  and nitro drip  with improvement in heart rate.  Labs were significant for lactic acid 2.5, NA 129 patient became more altered and was unable to coagulate state whether she wanted to be placed on a ventilator.  Spoke with patient's granddaughter who is power of attorney, she confirmed patient is DNR/DNI.  Surgery also evaluated and recommended NG tube and conservative management.  Past Medical History   has a past medical history of Abdominal aortic atherosclerosis ([place]), CAD (coronary artery disease), Cancer ([place]), Carotid artery occlusion, Common bile duct dilation, Gallstones, Headache(784.0), Hiatal hernia, HLD (hyperlipidemia), Myocardial infarction ([place]), PONV (postoperative nausea and  vomiting), and Stroke ([place]) (2003).   Significant Hospital Events   5/23 admit to PCCM  Consults:  Surgery Cardiology  Procedures:    Significant Diagnostic Tests:  5/23 CT abdomen pelvis>>Early/incomplete small bowel obstruction with transition point in the left mid abdomen.  Micro Data:  10/13/19 Sars-Cov-2>>pending 10/13/19 BCx2>>  Antimicrobials:  Zosyn 5/23- 5/24 Unasyn 5/24>>  Interim history/subjective:  Complains of right sided flank pain. No CP or SOB.  Objective   Blood pressure (!) 162/120, pulse 66, temperature 97.7 F (36.5 C), temperature source Oral, resp. rate 16, height 5\' 1"  (1.549 m), weight 46.1 kg, SpO2 99 %.        Intake/Output Summary (Last 24 hours) at 10/13/2019 0726 Last data filed at 10/13/2019 0700 Gross per 24 hour  Intake 283.63 ml  Output 1500 ml  Net -1216.37 ml   Filed Weights   10/12/19 1414 10/12/19 2237 10/13/19 0317  Weight: 44.5 kg 46.1 kg 46.1 kg    General: ill appearing woman laying in bed sleeping, arouses easily, comfortable appearing HEENT: [place]/AT, eyes anicteric, oral mucosa moist Neuro: awake, answering questions, oriented to place but not situation CV: RRR, no murmurs, NSR with LBBB on tele PULM: breathing comfortably on 2L [place], faint expiratory wheezing bilaterally GI: soft, NT, ND Extremities: warm,well perfused, no edema Skin: pallor, no rashes or petechiae  CXR 5/23 personally reviewed- RLL opacity, left dependent effusion, LUL opacity  Resolved Hospital Problem list     Assessment & Plan:   Acute hypoxic respiratory failure due to aspiration pneumonitis vs pneumonia RLL 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; c/b radiation esophagitis -titrate down O2 as able to maintain SpO2 90-94% -transition from  zosyn to unasyn; no previous pseudmonas on cultures -maintain euvolemia -Duonebs PRN  NSTEMI Episode of VT at admission; loaded with amiodarone 150mg  Hypertension -con't heparin gtt  -daily aspirin  -will start statin once able to take PO -repeat trop & EKG this morning -NTG & fentanyl PRN for chest pain -con't tele monitoring -Labetalol IV Q6h with hold parameters  Small bowel obstruction -Appreciate surgery's input- recommend conservative management -NGT for decompression if required -pain management -monitor electrolytes  Hyponatremia- correcting after NS Sodium 129, granddaughter states she has had issues with chronic hyponatremia in the past, levels 135 9 days ago.  Received 500 cc normal saline in the ED -Repeat BMP this afternoon  Compression fracture- diagnosed several weeks PTA after mechanical fall -fentanyl PRN   *Granddaughter April is POA, she feels that patient has recently been expressing readiness to pass away and patient is not improving in the next 24-48 will likely be ready to transition to comfort care    Best practice:  Diet: N.p.o. Pain/Anxiety/Delirium protocol (if indicated): As needed fentanyl VAP protocol (if indicated): N/A DVT prophylaxis: Heparin GI prophylaxis: N/A Glucose control: SSI Mobility: Bedrest Code Status: DNR/DNI Family Communication: will update family this afternoon Disposition: ICU  Labs   CBC: Recent Labs  Lab 10/12/19 1503 10/12/19 1941 10/12/19 2258 10/13/19 0258  WBC 7.1  --  11.8* 7.4  HGB 11.3* 12.9 11.3* 10.5*  HCT 34.3* 38.0 34.7* 31.9*  MCV 91.0  --  92.8 93.3  PLT 242  --  229 510    Basic Metabolic Panel: Recent Labs  Lab 10/12/19 1503 10/12/19 1934 10/12/19 1941 10/12/19 2258 10/13/19 0258  NA 129*  --  126* 129* 131*  K 4.5  --  3.8 4.6 4.0  CL 95*  --   --  94* 94*  CO2 25  --   --  22 26  GLUCOSE 104*  --   --  126* 102*  BUN 14  --   --  15 14  CREATININE 0.77 0.83  --  0.98 1.04*  CALCIUM 8.9  --   --  8.4* 8.4*  MG  --  2.1  --   --   --   PHOS  --  4.8*  --   --   --    GFR: Estimated Creatinine Clearance: 27.2 mL/min (A) (by C-G formula based on SCr of 1.04  mg/dL (H)). Recent Labs  Lab 10/12/19 1503 10/12/19 1934 10/12/19 2156 10/12/19 2258 10/13/19 0258  WBC 7.1  --   --  11.8* 7.4  LATICACIDVEN  --  2.5* 1.1  --   --     Liver Function Tests: Recent Labs  Lab 10/12/19 1503  AST 18  ALT 13  ALKPHOS 172*  BILITOT 1.1  PROT 6.4*  ALBUMIN 2.9*   Recent Labs  Lab 10/12/19 1503  LIPASE 50   No results for input(s): AMMONIA in the last 168 hours.  ABG    Component Value Date/Time   HCO3 23.8 10/12/2019 1941   TCO2 26 10/12/2019 1941   ACIDBASEDEF 4.0 (H) 10/12/2019 1941   O2SAT 90.0 10/12/2019 1941     Coagulation Profile: No results for input(s): INR, PROTIME in the last 168 hours.  Cardiac Enzymes: Recent Labs  Lab 10/12/19 1934  CKTOTAL 54    HbA1C: No results found for: HGBA1C  CBG: Recent Labs  Lab 10/12/19 2241 10/13/19 0010 10/13/19 0331  GLUCAP 110* 109* 86      This patient is  critically ill with multiple organ system failure which requires frequent high complexity decision making, assessment, support, evaluation, and titration of therapies. This was completed through the application of advanced monitoring technologies and extensive interpretation of multiple databases. During this encounter critical care time was devoted to patient care services described in this note for 36 minutes.

## 2019-10-13 NOTE — Progress Notes (Signed)
Pt transferred to 5N20, pt stable, no complications. Receiving RN at bedside. Pt transferred with dentures and April has been updated on new pt location.

## 2019-10-13 NOTE — Progress Notes (Signed)
Unable to place NG tube despite attempting with 2 different nurses. Mickel Baas Gleason, PA on floor and made aware. At this time pt ok not to have NG tube. Pts granddaughter, April, who is at bedside reports in the past pt has had a difficult time with NG tube due to past radiation.

## 2019-10-13 NOTE — Plan of Care (Signed)
Discussed Ms. Riffe's care with Dr. Rodena Piety from [facility], who will assume her care tomorrow morning.  Julian Hy, DO 10/13/19 10:24 AM [facility]

## 2019-10-13 NOTE — Progress Notes (Signed)
RN attempted foley insertion for transition to comfort care. RN was unsuccessful due to foley coiling in distal urethra. The patient was not uncomfortable at this time related to bladder fullness and was very anxious during and after insertion attempt. Decision was made to not insert foley as long as patient was comfortable, but if patient became uncomfortable, another attempt to place foley would be made. Patient agreed with decision.  Later on in shift patient was able to void spontaneously with 250cc measured and a moderate amount on bed pad/fitted sheet. Pt was not having any discomfort at this time. Purewick left in place. On coming RN made aware.

## 2019-10-13 NOTE — Progress Notes (Addendum)
[facility] Progress Note     Subjective: CC:  Denies abdominal pain, nausea, or emesis. Unsure if she is having flatus. reports BM x2 last night. Feels much less distended. States she lives in a house by herself at baseline.   Objective: Vital signs in last 24 hours: Temp:  [97.7 F (36.5 C)-99.3 F (37.4 C)] 97.7 F (36.5 C) (05/24 0716) Pulse Rate:  [64-140] 72 (05/24 0800) Resp:  [14-37] 21 (05/24 0800) BP: (97-207)/(46-179) 165/58 (05/24 0800) SpO2:  [93 %-100 %] 93 % (05/24 0800) Weight:  [44.5 kg-46.1 kg] 46.1 kg (05/24 0317) Last BM Date: 10/12/19  Intake/Output from previous day: 05/23 0701 - 05/24 0700 In: 283.6 [I.V.:183.9; IV Piggyback:99.8] Out: 1500 [Urine:1500] Intake/Output this shift: Total I/O In: 16.1 [I.V.:16.1] Out: -   PE: Gen:  Alert, NAD, pleasant Card:  Regular rate and rhythm, pedal pulses 2+ BL Pulm:  Normal effort, clear to auscultation bilaterally Abd: Soft, non-tender, mild distention, bowel sounds present in all 4 quadrants Skin: warm and dry, no rashes  Psych: A&Ox3   Lab Results:  Recent Labs    10/12/19 2258 10/13/19 0258  WBC 11.8* 7.4  HGB 11.3* 10.5*  HCT 34.7* 31.9*  PLT 229 203   BMET Recent Labs    10/12/19 2258 10/13/19 0258  NA 129* 131*  K 4.6 4.0  CL 94* 94*  CO2 22 26  GLUCOSE 126* 102*  BUN 15 14  CREATININE 0.98 1.04*  CALCIUM 8.4* 8.4*   PT/INR No results for input(s): LABPROT, INR in the last 72 hours. CMP     Component Value Date/Time   NA 131 (L) 10/13/2019 0258   NA 135 (L) 05/21/2017 0829   K 4.0 10/13/2019 0258   K 3.6 05/21/2017 0829   CL 94 (L) 10/13/2019 0258   CO2 26 10/13/2019 0258   CO2 27 05/21/2017 0829   GLUCOSE 102 (H) 10/13/2019 0258   GLUCOSE 162 (H) 05/21/2017 0829   BUN 14 10/13/2019 0258   BUN 22.6 05/21/2017 0829   CREATININE 1.04 (H) 10/13/2019 0258   CREATININE 1.22 (H) 10/03/2019 0953   CREATININE 1.1 05/21/2017 0829   CALCIUM 8.4 (L) 10/13/2019 0258    CALCIUM 11.3 (H) 05/21/2017 0829   PROT 6.4 (L) 10/12/2019 1503   PROT 6.7 05/21/2017 0829   ALBUMIN 2.9 (L) 10/12/2019 1503   ALBUMIN 2.4 (L) 05/21/2017 0829   AST 18 10/12/2019 1503   AST 22 10/03/2019 0953   AST 14 05/21/2017 0829   ALT 13 10/12/2019 1503   ALT 18 10/03/2019 0953   ALT 15 05/21/2017 0829   ALKPHOS 172 (H) 10/12/2019 1503   ALKPHOS 143 05/21/2017 0829   BILITOT 1.1 10/12/2019 1503   BILITOT 0.6 10/03/2019 0953   BILITOT 0.38 05/21/2017 0829   GFRNONAA 48 (L) 10/13/2019 0258   GFRNONAA 40 (L) 10/03/2019 0953   GFRAA 56 (L) 10/13/2019 0258   GFRAA 46 (L) 10/03/2019 0953   Lipase     Component Value Date/Time   LIPASE 50 10/12/2019 1503       Studies/Results: CT Abdomen Pelvis W Contrast  Result Date: 10/12/2019 CLINICAL DATA:  Suspected bowel obstruction. EXAM: CT ABDOMEN AND PELVIS WITH CONTRAST TECHNIQUE: Multidetector CT imaging of the abdomen and pelvis was performed using the standard protocol following bolus administration of intravenous contrast. CONTRAST:  123mL OMNIPAQUE IOHEXOL 300 MG/ML  SOLN COMPARISON:  None. FINDINGS: Lower chest: Left pleural effusion. Enlarged heart. Atherosclerotic disease of the aorta with calcified and  noncalcified plaque. Hepatobiliary: No focal liver abnormality is seen. Status post cholecystectomy. No biliary dilatation. Pancreas: Unremarkable. No pancreatic ductal dilatation or surrounding inflammatory changes. Spleen: Normal in size without focal abnormality. Adrenals/Urinary Tract: Adrenal glands are unremarkable. Kidneys are normal, without renal calculi, focal lesion, or hydronephrosis. Bladder is unremarkable. Stomach/Bowel: Normal appearance of the stomach and proximal small bowel. Fluid-filled dilated distal small bowel loops measuring up to 4.1 cm transversely. Transitional point is identified in the left mid abdomen. Residual gas and stool in the colon. Vascular/Lymphatic: Aortic atherosclerosis. No enlarged  abdominal or pelvic lymph nodes. Reproductive: Status post hysterectomy. No adnexal masses. Other: No abdominal wall hernia or abnormality. No abdominopelvic ascites. Musculoskeletal: Moderate in severity compression fracture of T10 vertebral body, which was not present on the chest CT dated Oct 03, 2019 chronic T11 compression fracture. IMPRESSION: 1. Early/incomplete small bowel obstruction with transition point in the left mid abdomen. 2. Moderate in severity compression fracture of T10 vertebral body, new from Oct 03, 2019. 3. Stable chronic T12 compression fracture. 4. Left pleural effusion. 5. Enlarged heart. Aortic Atherosclerosis (ICD10-I70.0). Electronically Signed   By: Fidela Salisbury M.D.   On: 10/12/2019 18:57   DG Chest Port 1 View  Result Date: 10/12/2019 CLINICAL DATA:  Shortness of breath EXAM: PORTABLE CHEST 1 VIEW COMPARISON:  CT scan Oct 03, 2019 FINDINGS: Opacity in the left upper lobe is at the site of the patient's known treated malignancy and likely represents post treatment change. Probable small left effusion. Mild edema identified. No other acute abnormalities. IMPRESSION: 1. Post treatment changes in the left upper lung. Mild edema and small left effusion. Electronically Signed   By: Dorise Bullion III M.D   On: 10/12/2019 19:16   DG Abd Portable 1V  Result Date: 10/13/2019 CLINICAL DATA:  SBO EXAM: PORTABLE ABDOMEN - 1 VIEW COMPARISON:  CT 10/12/2019, abdominal radiograph 01/08/2018 FINDINGS: High attenuation contrast material has traversed to the level of the transverse colon. There is a single air distended loop of small bowel in the mid abdomen, favor small bowel given the appearance of the CT. No other air distended loops of small bowel are evident to suggest high-grade small bowel obstruction. Excreted contrast media is seen within the bladder. The osseous structures appear diffusely demineralized which may limit detection of small or nondisplaced fractures. Prior right  hip hemiarthroplasty. Multilevel degenerative changes noted in the spine as well as in the hips. No acute or worrisome osseous lesion. IMPRESSION: 1. High attenuation contrast material has traversed to the level of the transverse colon. 2. Single air distended loop of small bowel in the mid abdomen, favor small bowel given the appearance of the CT though no direct correlate is seen. A closed loop at this level is not fully excluded though no upstream dilatation of bowel is present. Electronically Signed   By: Lovena Le M.D.   On: 10/13/2019 05:29    Anti-infectives: Anti-infectives (From admission, onward)   Start     Dose/Rate Route Frequency Ordered Stop   10/13/19 0830  Ampicillin-Sulbactam (UNASYN) 3 g in sodium chloride 0.9 % 100 mL IVPB    Note to Pharmacy: Unasyn 3 g IV q12h for CrCl < 72mL/min   3 g 200 mL/hr over 30 Minutes Intravenous 2 times daily 10/13/19 0731     10/13/19 0230  piperacillin-tazobactam (ZOSYN) IVPB 3.375 g  Status:  Discontinued     3.375 g 12.5 mL/hr over 240 Minutes Intravenous Every 8 hours 10/12/19 2024 10/13/19 0726  10/12/19 2030  piperacillin-tazobactam (ZOSYN) IVPB 3.375 g     3.375 g 100 mL/hr over 30 Minutes Intravenous  Once 10/12/19 2024 10/12/19 2104     Assessment/Plan Acute hypoxic respiratory failure  RLL PNA - possibly related to aspiration Chronic left dependent MPE  LUL NSCLC-SCC stage IIA; c/b radiation esophagitis  NSTEMI w/ VT on admission - hep gtt, daily ASA  SBO  - PSH appendectomy, laparoscopic cholecystectomy, abd hysterectomy - contrast in the colon on KUB, radiologist raised the question of a closed loop obstruction. Low suspicion for this given benign abdominal exam, normal lactate, and single transition point in left mid abdomen on CT scan - having BMs but no reliable flatus - SLP eval and ok for CLD and await further return of bowel function - agree with palliative consult to establish [place]; pt is DNR/DNI  FEN: CLD  ID:  UNASYN VTE: hep gtt, SCDs Dispo: stable for transfer out of ICU per primary team   LOS: 1 day    Obie Dredge, [facility] Please see Amion for pager number during day hours 7:00am-4:30pm

## 2019-10-13 NOTE — Plan of Care (Signed)
  Problem: Nutrition: Goal: Adequate nutrition will be maintained Outcome: Not Progressing Note: Pt has been NPO since arrival. She was advanced to clear liquids today, pending she is able to tolerate them.   Problem: Elimination: Goal: Will not experience complications related to bowel motility Outcome: Not Progressing Note: Pt is not currently passing gas. Last bowel movement 5/23. Goal: Will not experience complications related to urinary retention Outcome: Not Progressing Note: Pt was I/O cath twice over night. No UOP as of this morning. Will bladder scan to evaluate for retention.

## 2019-10-13 NOTE — Progress Notes (Signed)
[facility] for Heparin Indication: chest pain/ACS  Allergies  Allergen Reactions  . Dextroamphetamine Sulfate Er Other (See Comments)    Euphoric feeling    Patient Measurements: Height: 5\' 1"  (154.9 cm) Weight: 46.1 kg (101 lb 10.1 oz) IBW/kg (Calculated) : 47.8 Heparin Dosing Weight: 44.5 kg  Vital Signs: Temp: 97.8 F (36.6 C) (05/24 0333) Temp Source: Axillary (05/24 0333) BP: 166/71 (05/24 0430) Pulse Rate: 65 (05/24 0430)  Labs: Recent Labs    10/12/19 1503 10/12/19 1503 10/12/19 1934 10/12/19 1941 10/12/19 1941 10/12/19 2156 10/12/19 2258 10/13/19 0258  HGB 11.3*   < >  --  12.9   < >  --  11.3* 10.5*  HCT 34.3*   < >  --  38.0  --   --  34.7* 31.9*  PLT 242  --   --   --   --   --  229 203  HEPARINUNFRC  --   --   --   --   --   --   --  0.24*  CREATININE 0.77   < > 0.83  --   --   --  0.98 1.04*  CKTOTAL  --   --  54  --   --   --   --   --   TROPONINIHS  --   --  100*  --   --  1,468*  --   --    < > = values in this interval not displayed.    Estimated Creatinine Clearance: 27.2 mL/min (A) (by C-G formula based on SCr of 1.04 mg/dL (H)).   Medical History: Past Medical History:  Diagnosis Date  . Abdominal aortic atherosclerosis ([place])   . CAD (coronary artery disease)   . Cancer ([place])   . Carotid artery occlusion   . Common bile duct dilation   . Gallstones   . Headache(784.0)   . Hiatal hernia   . HLD (hyperlipidemia)   . Myocardial infarction ([place])   . PONV (postoperative nausea and vomiting)   . Stroke [facility]) 2003    Medications:  Scheduled:  . Chlorhexidine Gluconate Cloth  6 each Topical Q0600  . furosemide  40 mg Intravenous Q12H  . mouth rinse  15 mL Mouth Rinse BID  . ondansetron (ZOFRAN) IV  4 mg Intravenous Once  . sodium chloride flush  3 mL Intravenous Once    Assessment: Patient is a 89 yof that presented to the ED with complaints of constipation and found to have an early SBO. After  CT the patient had an episode of wide complex tachycardia and decreased respirations. The patient was given an amio bolus and nitro drip which improved the patient. Pharmacy has been asked to start heparin for ACS as the patient does have an elevated trop at this time.   Initial heparin level came back slightly subtherapeutic at 0.24, on 550 units/hr. Hgb 10.5, plt 203. No s/sx of bleeding or infusion issues.   Goal of Therapy:  Heparin level 0.3-0.7 units/ml Monitor platelets by anticoagulation protocol: Yes   Plan:  - Increase heparin infusion to 650 units/hr - Heparin level in ~ 8 hours  - Monitor patient for s/s of bleeding and CBC while on heparin   Antonietta Jewel, PharmD, BCCCP Clinical Pharmacist  10/13/2019 5:21 AM  Please check AMION for all [place] phone numbers After 10:00 PM, call [place] [phone]

## 2019-10-14 DIAGNOSIS — R627 Adult failure to thrive: Secondary | ICD-10-CM

## 2019-10-14 MED ORDER — BISACODYL 10 MG RE SUPP: 10.0000 mg | Freq: Every day | RECTAL | Status: DC | PRN

## 2019-10-14 MED ORDER — POLYETHYLENE GLYCOL 3350 17 G PO PACK
17.0000 g | PACK | Freq: Every day | ORAL | Status: DC
  Administered 2019-10-14: 17 g via ORAL
  Filled 2019-10-14: qty 1

## 2019-10-14 MED ORDER — LORAZEPAM 1 MG PO TABS: 1.0000 mg | ORAL_TABLET | ORAL | 0 refills | Status: DC | PRN

## 2019-10-14 MED ORDER — MORPHINE SULFATE (CONCENTRATE) 10 MG/0.5ML PO SOLN: 5.0000 mg | ORAL | 0 refills | Status: DC | PRN

## 2019-10-14 MED ORDER — LORAZEPAM 1 MG PO TABS: 1.0000 mg | ORAL_TABLET | ORAL | Status: DC | PRN

## 2019-10-14 NOTE — Progress Notes (Signed)
Patient discharged home by private care and son.   Son gathered patient's belongings and took with him at time of discharge.  Both acknowledged understanding of the discharge instructions, went over change in medications and gave son hard copy of prescription.  Denied any further questions or needs at this time.

## 2019-10-14 NOTE — Plan of Care (Signed)
  Problem: Education: Goal: Knowledge of General Education information will improve Description: Including pain rating scale, medication(s)/side effects and non-pharmacologic comfort measures 10/14/2019 1707 by Baldomero Lamy, RN Outcome: Adequate for Discharge 10/14/2019 [phone] by Baldomero Lamy, RN Outcome: Progressing   Problem: Health Behavior/Discharge Planning: Goal: Ability to manage health-related needs will improve 10/14/2019 1707 by Baldomero Lamy, RN Outcome: Adequate for Discharge 10/14/[phone] by Baldomero Lamy, RN Outcome: Progressing   Problem: Clinical Measurements: Goal: Ability to maintain clinical measurements within normal limits will improve 10/14/2019 1707 by Baldomero Lamy, RN Outcome: Adequate for Discharge 10/14/2019 0839 by Baldomero Lamy, RN Outcome: Progressing Goal: Will remain free from infection 10/14/2019 1707 by Baldomero Lamy, RN Outcome: Adequate for Discharge 10/14/2019 [phone] by Baldomero Lamy, RN Outcome: Progressing Goal: Diagnostic test results will improve 10/14/2019 1707 by Baldomero Lamy, RN Outcome: Adequate for Discharge 10/14/[phone] by Baldomero Lamy, RN Outcome: Progressing Goal: Respiratory complications will improve 10/14/2019 1707 by Baldomero Lamy, RN Outcome: Adequate for Discharge 10/14/2019 [phone] by Baldomero Lamy, RN Outcome: Progressing Goal: Cardiovascular complication will be avoided 10/14/2019 1707 by Baldomero Lamy, RN Outcome: Adequate for Discharge 10/14/2019 [phone] by Baldomero Lamy, RN Outcome: Progressing   Problem: Activity: Goal: Risk for activity intolerance will decrease 10/14/2019 1707 by Baldomero Lamy, RN Outcome: Adequate for Discharge 10/14/2019 [phone] by Baldomero Lamy, RN Outcome: Progressing   Problem: Nutrition: Goal: Adequate nutrition will be maintained 10/14/2019 1707 by Baldomero Lamy, RN Outcome: Adequate for Discharge 10/14/2019 [phone] by Baldomero Lamy, RN Outcome: Progressing   Problem: Coping: Goal: Level of anxiety  will decrease 10/14/2019 1707 by Baldomero Lamy, RN Outcome: Adequate for Discharge 10/14/2019 [phone] by Baldomero Lamy, RN Outcome: Progressing   Problem: Elimination: Goal: Will not experience complications related to bowel motility 10/14/2019 1707 by Baldomero Lamy, RN Outcome: Adequate for Discharge 10/14/2019 [phone] by Baldomero Lamy, RN Outcome: Progressing Goal: Will not experience complications related to urinary retention 10/14/2019 1707 by Baldomero Lamy, RN Outcome: Adequate for Discharge 10/14/2019 [phone] by Baldomero Lamy, RN Outcome: Progressing   Problem: Pain Managment: Goal: General experience of comfort will improve 10/14/2019 1707 by Baldomero Lamy, RN Outcome: Adequate for Discharge 10/14/[phone] by Baldomero Lamy, RN Outcome: Progressing   Problem: Safety: Goal: Ability to remain free from injury will improve 10/14/2019 1707 by Baldomero Lamy, RN Outcome: Adequate for Discharge 10/14/[phone] by Baldomero Lamy, RN Outcome: Progressing   Problem: Skin Integrity: Goal: Risk for impaired skin integrity will decrease 10/14/2019 1707 by Baldomero Lamy, RN Outcome: Adequate for Discharge 10/14/2019 [phone] by Baldomero Lamy, RN Outcome: Progressing

## 2019-10-14 NOTE — Discharge Summary (Signed)
Physician Discharge Summary         Patient ID: [name] MRN: [id number] DOB/AGE: [birth year] 86 y.o.  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  Discharge summary   [name] is an 86 year old female  stage III non-small cell lung cancer with LUL lung mass treated with chemoradiation completed in 2019 and in remission , CAD, hyperlipidemia who presented with 1 week of constipation and abdominal pain.  She has had a bowel obstruction in the past and stated it felt similar.   She underwent a CT abdomen/pelvis with contrast which showed early SBO with transition point in the left midabdomen.  She subsequently had an episode of vomiting and likely aspiration with worsening respiratory status and wide-complex tachycardia.  She was placed on a nonrebreather mask and given amiodarone '150mg'$  and nitro drip  with improvement in heart rate.  Labs were significant for lactic acid 2.5, NA 129 patient became more altered and was unable to coagulate state whether she wanted to be placed on a ventilator.  Spoke with patient's granddaughter who is power of attorney, she confirmed patient is DNR/DNI.  Surgery also evaluated and recommended NG tube and conservative management She subsequently met with Palliation 5/24, and she desires comfort care. Pain/ anxiety medications recommendations were made, and ordered. Pt made the decision to go home with hospice 5/25 am. She is on Room air, awake and alert and oriented. She was treated with Zosyn and Unasyn, and nebulizer treatments.. She has regained bowel function . She is tolerating a clear liquid  diet. She is a full DNR , and will go home with Hospice Care. Hospice will evaluate patient at home 5/26. Granddaughter ( April) has arranged for 24/7 care with family and friends. Pt. Has a rolling walker and a ramp already. Case Management is working on getting a wheelchair . Pt's home is too small for a hospital bed. Plan is to use the patient's bed for now. 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; c/b radiation esophagitis Currently on RA - Home on 5 days of Augmentin to complete antibiotic therapy - Oxygen as needed for Comfort per Hospice - Duonebs PRN for shortness of breath or wheezing - Roxenol 5 mg po/ SL Q 2 prn pain - Ativan 1 mg po/SL Q 4 prn anxiety/ Sleep  NSTEMI Episode of VT at admission; loaded with amiodarone '150mg'$  Hypertension Can add home Lisinopril if BP becomes an issue  Small bowel obstruction Bowel Function has resolved Tolerating Clear Liquids Plan Per Surgery, advance to Full Liquids  Hyponatremia- correcting after NS No Further treatment once discharged   Compression fracture- diagnosed several weeks PTA after mechanical fall - Pain medications as above - Hospice to adjust once patient is assessed  Comfort Care - Pain medications as noted above   Significant Hospital tests/ studies  5/23 CT abdomen pelvis>>Early/incomplete small bowel obstruction with transition point in the left mid abdomen Procedures    Culture data/antimicrobials   10/13/19 Sars-Cov-2>>pending  10/13/19 BCx2>> Antimicrobials: Zosyn 5/23- 5/24 Unasyn 5/24>>   Consults  PCCM CCS    Discharge Exam: BP (!) 158/78 (BP Location: Right Arm)   Pulse 81   Temp 97.6 F (36.4 C)   Resp 14   Ht '5\' 1"'$  (1.549 m)   Wt 46.1 kg   SpO2 92%   BMI 19.20 kg/m    General:Awake and alert frail elderly female in NAD on RA Skin:Warm and dry, brisk capillary refill, thin and frail HEENT:normocephalic, sclera clear, mucus membranes moist Neck:supple, no JVD, no bruits  Heart:S1S2 RRR without murmur, gallup, rub or click Lungs:clear without rales, rhonchi, or wheezes EUM:PNTI, non tender, + BS, do not palpate liver spleen or masses Ext:no lower ext edema, 2+ pedal pulses, 2+ radial pulses Neuro:alert and oriented X 3, MAE, follows commands, + facial symmetry Labs at discharge   Lab Results  Component Value Date   CREATININE 1.04 (H) 10/13/2019   BUN 14 10/13/2019   NA 131 (L) 10/13/2019   K 4.0 10/13/2019   CL 94 (L) 10/13/2019   CO2 26 10/13/2019   Lab Results  Component Value Date   WBC 7.4 10/13/2019   HGB 10.5 (L) 10/13/2019   HCT 31.9 (L) 10/13/2019   MCV 93.3 10/13/2019   PLT 203 10/13/2019   Lab Results  Component Value Date   ALT 13 10/12/2019   AST 18 10/12/2019   ALKPHOS 172 (H) 10/12/2019   BILITOT 1.1 10/12/2019   Lab Results  Component Value Date   INR 1.04 04/03/2017   INR 0.98 01/09/2014    Current radiological studies    CT Abdomen Pelvis W Contrast  Result Date: 10/12/2019 CLINICAL DATA:  Suspected bowel obstruction. EXAM: CT ABDOMEN AND PELVIS WITH CONTRAST TECHNIQUE: Multidetector CT imaging of the abdomen and pelvis was performed using the standard protocol following bolus administration of intravenous contrast. CONTRAST:  153m OMNIPAQUE IOHEXOL 300 MG/ML  SOLN COMPARISON:  None. FINDINGS: Lower chest: Left pleural effusion. Enlarged heart. Atherosclerotic disease of the aorta with calcified and noncalcified plaque. Hepatobiliary: No focal liver abnormality is seen. Status post cholecystectomy. No biliary dilatation. Pancreas: Unremarkable. No pancreatic ductal dilatation or surrounding inflammatory changes. Spleen: Normal in size without focal abnormality. Adrenals/Urinary Tract: Adrenal glands are unremarkable.  Kidneys are normal, without renal calculi, focal lesion, or hydronephrosis. Bladder is unremarkable. Stomach/Bowel: Normal appearance of the stomach and proximal small bowel. Fluid-filled dilated distal small bowel loops measuring up to 4.1 cm transversely. Transitional point is identified in the left mid abdomen. Residual gas and stool in the colon. Vascular/Lymphatic: Aortic atherosclerosis. No enlarged abdominal or pelvic lymph nodes. Reproductive: Status post hysterectomy. No adnexal masses. Other: No abdominal wall hernia or abnormality. No abdominopelvic ascites. Musculoskeletal: Moderate in severity compression fracture of T10 vertebral body, which was not present on the chest CT dated Oct 03, 2019 chronic T11 compression fracture. IMPRESSION: 1. Early/incomplete small bowel obstruction with transition point in the left mid abdomen. 2. Moderate in severity compression fracture of T10 vertebral body, new from Oct 03, 2019. 3. Stable chronic T12 compression fracture. 4. Left pleural effusion. 5. Enlarged heart. Aortic Atherosclerosis (ICD10-I70.0). Electronically Signed   By: DFidela SalisburyM.D.   On: 10/12/2019 18:57   DG Chest Port 1 View  Result Date: 10/12/2019 CLINICAL DATA:  Shortness of breath EXAM: PORTABLE CHEST 1 VIEW COMPARISON:  CT scan Oct 03, 2019 FINDINGS: Opacity in the left upper lobe is at the site of the patient's known treated malignancy and likely represents  post treatment change. Probable small left effusion. Mild edema identified. No other acute abnormalities. IMPRESSION: 1. Post treatment changes in the left upper lung. Mild edema and small left effusion. Electronically Signed   By: Dorise Bullion III M.D   On: 10/12/2019 19:16   DG Abd Portable 1V  Result Date: 10/13/2019 CLINICAL DATA:  SBO EXAM: PORTABLE ABDOMEN - 1 VIEW COMPARISON:  CT 10/12/2019, abdominal radiograph 01/08/2018 FINDINGS: High attenuation contrast material has traversed to the level of the transverse  colon. There is a single air distended loop of small bowel in the mid abdomen, favor small bowel given the appearance of the CT. No other air distended loops of small bowel are evident to suggest high-grade small bowel obstruction. Excreted contrast media is seen within the bladder. The osseous structures appear diffusely demineralized which may limit detection of small or nondisplaced fractures. Prior right hip hemiarthroplasty. Multilevel degenerative changes noted in the spine as well as in the hips. No acute or worrisome osseous lesion. IMPRESSION: 1. High attenuation contrast material has traversed to the level of the transverse colon. 2. Single air distended loop of small bowel in the mid abdomen, favor small bowel given the appearance of the CT though no direct correlate is seen. A closed loop at this level is not fully excluded though no upstream dilatation of bowel is present. Electronically Signed   By: Lovena Le M.D.   On: 10/13/2019 05:29   VAS [place] LOWER EXTREMITY VENOUS (DVT)  Result Date: 10/13/2019  Lower Venous DVTStudy Indications: Edema.  Risk Factors: None identified. Comparison Study: No prior studies. Performing Technologist: Oliver Hum RVT  Examination Guidelines: A complete evaluation includes B-mode imaging, spectral Doppler, color Doppler, and power Doppler as needed of all accessible portions of each vessel. Bilateral testing is considered an integral part of a complete examination. Limited examinations for reoccurring indications may be performed as noted. The reflux portion of the exam is performed with the patient in reverse Trendelenburg.  +---------+---------------+---------+-----------+----------+--------------+ RIGHT    CompressibilityPhasicitySpontaneityPropertiesThrombus Aging +---------+---------------+---------+-----------+----------+--------------+ CFV      Full           Yes      Yes                                  +---------+---------------+---------+-----------+----------+--------------+ SFJ      Full                                                        +---------+---------------+---------+-----------+----------+--------------+ FV Prox  Full                                                        +---------+---------------+---------+-----------+----------+--------------+ FV Mid   Full                                                        +---------+---------------+---------+-----------+----------+--------------+ FV DistalFull                                                        +---------+---------------+---------+-----------+----------+--------------+  PFV      Full                                                        +---------+---------------+---------+-----------+----------+--------------+ POP      Full           Yes      Yes                                 +---------+---------------+---------+-----------+----------+--------------+ PTV      Full                                                        +---------+---------------+---------+-----------+----------+--------------+ PERO     Full                                                        +---------+---------------+---------+-----------+----------+--------------+ There is evidence of high grade stenosis/occlusion in the superficial femoral artery. Arterial flow appears patent in the popliteal, posterior tibial, peroneal, and anterior tibial arteries.  +---------+---------------+---------+-----------+----------+--------------+ LEFT     CompressibilityPhasicitySpontaneityPropertiesThrombus Aging +---------+---------------+---------+-----------+----------+--------------+ CFV      Full           Yes      Yes                                 +---------+---------------+---------+-----------+----------+--------------+ SFJ      Full                                                         +---------+---------------+---------+-----------+----------+--------------+ FV Prox  Full                                                        +---------+---------------+---------+-----------+----------+--------------+ FV Mid   Full                                                        +---------+---------------+---------+-----------+----------+--------------+ FV DistalFull                                                        +---------+---------------+---------+-----------+----------+--------------+ PFV      Full                                                        +---------+---------------+---------+-----------+----------+--------------+  POP      Full           Yes      Yes                                 +---------+---------------+---------+-----------+----------+--------------+ PTV      Full                                                        +---------+---------------+---------+-----------+----------+--------------+ PERO     Full                                                        +---------+---------------+---------+-----------+----------+--------------+     Summary: RIGHT: - There is no evidence of deep vein thrombosis in the lower extremity.  - No cystic structure found in the popliteal fossa. - There is evidence of high grade stenosis/occlusion in the superficial femoral artery. Arterial flow appears patent in the popliteal, posterior tibial, peroneal, and anterior tibial arteries.  LEFT: - There is no evidence of deep vein thrombosis in the lower extremity.  - No cystic structure found in the popliteal fossa.  *See table(s) above for measurements and observations. Electronically signed by Ruta Hinds MD on 10/13/2019 at 6:35:14 PM.    Final     Disposition:  Home with Hospice Care / [place]      Allergies as of 10/14/2019      Reactions   Dextroamphetamine Sulfate Er Other (See Comments)   Euphoric feeling      Medication  List    STOP taking these medications   aspirin 81 MG EC tablet   b complex vitamins capsule   lisinopril 20 MG tablet Commonly known as: ZESTRIL   Multivitamin Adult Extra C Chew   protein supplement shake Liqd Commonly known as: PREMIER PROTEIN   traMADol 50 MG tablet Commonly known as: ULTRAM   Vitamin D3 75 MCG (3000 UT) Tabs     TAKE these medications   escitalopram 10 MG tablet Commonly known as: LEXAPRO Take 10 mg by mouth daily.   LORazepam 1 MG tablet Commonly known as: ATIVAN Take 1 tablet (1 mg total) by mouth every 4 (four) hours as needed for anxiety or sleep.   morphine CONCENTRATE 10 MG/0.5ML Soln concentrated solution Take 0.25 mLs (5 mg total) by mouth every 2 (two) hours as needed for up to 7 days for moderate pain, severe pain or shortness of breath.            Durable Medical Equipment  (From admission, onward)         Start     Ordered   10/14/19 1337  For home use only DME lightweight manual wheelchair with seat cushion  Once    Comments: Patient suffers from shortness of breath/ lung cancer stage 3/ generalized weakness which impairs their ability to perform daily activities like [id number] in the home.  A walking [id number] will not resolve  issue with performing activities of daily living. A wheelchair will allow patient to safely perform daily activities. Patient is not able to propel themselves  in the home using a standard weight wheelchair due to weakness:20653. Patient can self propel in the lightweight wheelchair. Length of need :22503- [employer]}. Accessories: elevating leg rests (ELRs), wheel locks, extensions and anti-tippers.   10/14/19 1338           Follow-up appointment   Hospice Assessment 5/26 Discharge Condition:    fair  Physician Statement:   The Patient was personally examined, the discharge assessment and plan has been personally reviewed and I agree with ACNP  Groce's  assessment and plan. 60  minutes of time have  been dedicated to discharge assessment, planning and discharge instructions.   Signed: Magdalen Spatz, MSN, AGACNP-BC [place] for personal pager PCCM on call pager [phone] 10/14/2019, 1:46 PM

## 2019-10-14 NOTE — Plan of Care (Signed)
  Problem: Pain Managment: Goal: General experience of comfort will improve Outcome: Progressing   Problem: Safety: Goal: Ability to remain free from injury will improve Outcome: Progressing   Problem: Skin Integrity: Goal: Risk for impaired skin integrity will decrease Outcome: Progressing   

## 2019-10-14 NOTE — Evaluation (Signed)
Physical Therapy Evaluation and Discharge Patient Details Name: [name] MRN: [id number] DOB: 09-03-1928 Today's Date: 10/14/2019   History of Present Illness  Pt is an 86 y/o female admitted secondary to SBO and hypoxic respiratory failure secondary to RLL PNA. Pt also with T10 compression fx. Per notes, pt has decided to go home with hospice services. PMH includes CAD, lung cancer, MI, and CVA.   Clinical Impression  Pt admitted secondary to problem above with deficits below. Pt presenting with increased weakness and fatigue, so mobility limited to chair. Required min A and use of RW to transfer to chair. Educated about DME recommendations as pt going home with hospice services. Pt currently refusing ambulance transport home. Per case manager, family and friends will be able to provide 24/7 assist at home. Will continue to follow acutely to maximize functional mobility independence and safety.     Follow Up Recommendations Other (comment);Supervision/Assistance - 24 hour(home with hospice services)    Equipment Recommendations  Wheelchair (measurements PT);Wheelchair cushion (measurements PT)(pt reports hospital bed will not fit in home)    Recommendations for Other Services       Precautions / Restrictions Precautions Precautions: Fall Restrictions Weight Bearing Restrictions: No      Mobility  Bed Mobility Overal bed mobility: Needs Assistance Bed Mobility: Rolling;Sidelying to Sit Rolling: Min assist Sidelying to sit: Min assist       General bed mobility comments: Min A for assist with rolling and for trunk assist to come to sitting. Educated about using log roll technique.   Transfers Overall transfer level: Needs assistance Equipment used: Rolling walker (2 wheeled) Transfers: Sit to/from [facility] Sit to Stand: Min assist Stand pivot transfers: Min assist       General transfer comment: Min A for lift assist and steadying to stand and  transfer to chair. Was able to stand X2 for clean up as pt's purewick had leaked on the bed.   Ambulation/Gait                Stairs            Wheelchair Mobility    Modified Rankin (Stroke Patients Only)       Balance Overall balance assessment: Needs assistance Sitting-balance support: No upper extremity supported;Feet supported Sitting balance-Leahy Scale: Fair     Standing balance support: Bilateral upper extremity supported;During functional activity Standing balance-Leahy Scale: Poor Standing balance comment: Reliant on BUE supoprt and external assist.                              Pertinent Vitals/Pain Pain Assessment: Faces Faces Pain Scale: Hurts little more Pain Location: generalized Pain Descriptors / Indicators: Grimacing;Guarding Pain Intervention(s): Monitored during session;Limited activity within patient's tolerance;Repositioned    Home Living Family/patient expects to be discharged to:: Private residence Living Arrangements: Alone Available Help at Discharge: Family;Friend(s);Available 24 hours/day Type of Home: House Home Access: Ramped entrance     Home Layout: One level Home Equipment: Walker - 2 wheels;Bedside commode Additional Comments: Per notes, family will be able to provide 24/7 assist at d/c     Prior Function Level of Independence: Independent with assistive device(s)         Comments: Was using RW for ambulation PTA.      Hand Dominance        Extremity/Trunk Assessment   Upper Extremity Assessment Upper Extremity Assessment: Generalized weakness    Lower Extremity  Assessment Lower Extremity Assessment: Generalized weakness    Cervical / Trunk Assessment Cervical / Trunk Assessment: Other exceptions;Kyphotic Cervical / Trunk Exceptions: T 10 compression fx  Communication   Communication: No difficulties  Cognition Arousal/Alertness: Awake/alert Behavior During Therapy: WFL for tasks  assessed/performed Overall Cognitive Status: Within Functional Limits for tasks assessed                                        General Comments General comments (skin integrity, edema, etc.): Discussed DME recommendations with pt. reports hospital bed will likely not fit in her home at this point. Pt also refusing ambulance transport home.     Exercises     Assessment/Plan    PT Assessment Patent does not need any further PT services  PT Problem List         PT Treatment Interventions      PT Goals (Current goals can be found in the Care Plan section)  Acute Rehab PT Goals Patient Stated Goal: to go home PT Goal Formulation: With patient Time For Goal Achievement: 10/14/19 Potential to Achieve Goals: Fair    Frequency     Barriers to discharge        Co-evaluation               AM-PAC PT "6 Clicks" Mobility  Outcome Measure Help needed turning from your back to your side while in a flat bed without using bedrails?: A Little Help needed moving from lying on your back to sitting on the side of a flat bed without using bedrails?: A Little Help needed moving to and from a bed to a chair (including a wheelchair)?: A Little Help needed standing up from a chair using your arms (e.g., wheelchair or bedside chair)?: A Little Help needed to walk in hospital room?: A Lot Help needed climbing 3-5 steps with a railing? : Total 6 Click Score: 15    End of Session Equipment Utilized During Treatment: Gait belt Activity Tolerance: Patient limited by fatigue Patient left: in chair;with call bell/phone within reach;with family/visitor present Nurse Communication: Mobility status PT Visit Diagnosis: Other abnormalities of gait and mobility (R26.89);Muscle weakness (generalized) (M62.81);Difficulty in walking, not elsewhere classified (R26.2)    Time: 7793-9030 PT Time Calculation (min) (ACUTE ONLY): 30 min   Charges:   PT Evaluation $PT Eval Moderate  Complexity: 1 Mod PT Treatments $Therapeutic Activity: 8-22 mins        Lou Miner, DPT  Acute Rehabilitation Services  Pager: [phone] Office: [phone]   Rudean Hitt 10/14/2019, 12:13 PM

## 2019-10-14 NOTE — Care Management (Signed)
    Durable Medical Equipment  (From admission, onward)         Start     Ordered   10/14/19 1337  For home use only DME lightweight manual wheelchair with seat cushion  Once    Comments: Patient suffers from shortness of breath/ lung cancer stage 3/ generalized weakness which impairs their ability to perform daily activities like [id number] in the home.  A walking WCN:[id number] will not resolve  issue with performing activities of daily living. A wheelchair will allow patient to safely perform daily activities. Patient is not able to propel themselves in the home using a standard weight wheelchair due to weakness:20653. Patient can self propel in the lightweight wheelchair. Length of need :22503- [employer]}. Accessories: elevating leg rests (ELRs), wheel locks, extensions and anti-tippers.   10/14/19 1338

## 2019-10-14 NOTE — Progress Notes (Signed)
Patient suffers from weakness/inability to ambulate which impairs their ability to perform daily activities like ambulation, ADL activities. in the home.  A walker alone will not resolve the issues with performing activities of daily living. A wheelchair will allow patient to safely perform daily activities.  The patient can self propel in the home or has a caregiver who can provide assistance.     Reuel Derby, PT, DPT  Acute Rehabilitation Services  Pager: [phone] Office: [phone]

## 2019-10-14 NOTE — TOC Initial Note (Signed)
Transition of Care [facility]) - Initial/Assessment Note    Patient Details  Name: [name] MRN: [id number] Date of Birth: Aug 13, 1928  Transition of Care [facility]) CM/SW Contact:    Curlene Labrum, RN Phone Number: 10/14/2019, 11:41 AM  Clinical Narrative:                 Case Management met the patient and son, [name], at the bedside.  Patient admitted for hypoxia, aspiration pneumonia, respiratory distress, and stage III lung cancer.  Patient is currently alert and oriented times 4, on room air and not in respiratory distress.  Patient verbalized that she would like to go home with hospice.  I called and spoke with [name], granddaughter, and she confirms that she would like her grandmother to be able to go home with hospice services and family and friends will be providing 24 hour supervision since the patient was living alone prior to this admission.  PT to assess the patient prior to discharge to see if any equipment is needed in the home prior to discharge.  Patient has a rolling walker and a ramp in the home.  The granddaughter states that the house is small and may not have room for a hospital bed.  Will follow for discharge needs.  Expected Discharge Plan: Home w Hospice Care Barriers to Discharge: (Martha, granddaughter, providing 24 hour assistance at home with family and friends after discharge)   Patient Goals and CMS Choice Patient states their goals for this hospitalization and ongoing recovery are:: I want to go home with hospice care. CMS Medicare.gov Compare Post Acute Care list provided to:: Patient Choice offered to / list presented to : Patient(confirmed with Martha, granddaughter - G[place])  Expected Discharge Plan and Services Expected Discharge Plan: [place] In-house Referral: Hospice / Palliative Care Discharge Planning Services: CM Consult Post Acute Care Choice: Hospice Living arrangements for the past 2 months: [place]: RN [facility] Agency: Hospice and [place] Date [place]: 10/14/19 Time [place]: 1139 Representative spoke with at [place]: Sherian Rein, RN with [facility]  Prior Living Arrangements/Services Living arrangements for the past 2 months: Single Family Home Lives with:: Self Patient language and need for interpreter reviewed:: Yes Do you feel safe going back to the place where you live?: Yes      Need for Family Participation in Patient Care: Yes (Comment) Care giver support system in place?: Yes (comment) Current home services: DME(patient has RW, bathtub assist but no wheelchair) Criminal Activity/Legal Involvement Pertinent to Current Situation/Hospitalization: No - Comment as needed  Activities of Daily Living      Permission Sought/Granted Permission sought to share information with : Case Manager, Family Supports Permission granted to share information with : Yes, Verbal Permission Granted     Permission granted to share info w AGENCY: [facility] [place] granted to share info w Relationship: granddaughter, Martha     Emotional Assessment Appearance:: Appears stated age Attitude/Demeanor/Rapport: Gracious Affect (typically observed): Accepting Orientation: : Oriented to Self, Oriented to Place, Oriented to  Time, Oriented to Situation Alcohol / Substance Use: Not Applicable Psych Involvement: No (comment)  Admission diagnosis:  Bowel obstruction (HCC) [K56.609] Small bowel obstruction ([place]) [K56.609] SBO (small bowel obstruction) ([place]) [K56.609] Respiratory distress [R06.03] Patient Active  Problem List   Diagnosis Date Noted  . Pressure injury of skin 10/13/2019  . Adult failure to thrive   . DNR (do not resuscitate)   . Palliative care by specialist   . Bowel obstruction ([place]) 10/12/2019  . Respiratory distress   . Elevated troponin   . Drug-induced  polyneuropathy ([place]) 09/30/2019  . Sensorineural hearing loss (SNHL), bilateral 02/13/2018  . Malnutrition of moderate degree 01/08/2018  . SBO (small bowel obstruction) ([place]) 01/06/2018  . Community acquired pneumonia   . Hypercalcemia 05/21/2017  . MDD (major depressive disorder), recurrent episode, moderate ([place]) 05/11/2017  . Dehydration 05/10/2017  . Weakness generalized 05/10/2017  . Encounter for antineoplastic chemotherapy 05/04/2017  . Goals of care, counseling/discussion 05/04/2017  . Malignant neoplasm of bronchus of left upper lobe ([place]) 05/03/2017  . Chest congestion 04/03/2017  . Constipation 04/03/2017  . Adenoma of left adrenal gland 04/03/2017  . SIADH (syndrome of inappropriate ADH production) ([place]) 04/02/2017  . Lung mass 04/02/2017  . Elevated AST (SGOT) 04/02/2017  . Leukocytosis 04/02/2017  . Normocytic anemia 04/02/2017  . Cerumen impaction 11/19/2015  . Hip fracture requiring operative repair ([place]) 01/09/2014  . Closed right hip fracture ([place]) 01/09/2014  . Hyponatremia 01/09/2014  . Symptomatic cholelithiasis 03/20/2013  . Dilated bile duct 03/20/2013  . Abdominal aortic atherosclerosis ([place])   . Hypertension 10/19/2011  . Tobacco abuse 02/18/2009  . HLD (hyperlipidemia) 02/17/2009  . ANXIETY DISORDER 02/17/2009  . CAD (coronary artery disease) 02/17/2009  . CAROTID ARTERY OCCLUSION 02/17/2009  . CVA 02/17/2009   PCP:  Leonard Downing, MD Pharmacy:   [facility], [place] - [address] [place] [address] Phone: [phone] Fax: [phone]     Social Determinants of Health (SDOH) Interventions    Readmission Risk Interventions Readmission Risk Prevention Plan 10/14/2019  Transportation Screening Complete  PCP or Specialist Appt within 5-7 Days Complete  Home Care Screening Complete  Medication Review (RN CM) Complete  Some recent data might be hidden

## 2019-10-14 NOTE — Progress Notes (Signed)
[facility] Progress Note     Subjective: CC-  States that she is miserable, but her abdomen is not giving her any trouble. Tolerating clear liquids. Feels hungry. Denies n/v or abdominal bloating. No flatus, last BM 2 days ago.  Objective: Vital signs in last 24 hours: Temp:  [96.8 F (36 C)-98.2 F (36.8 C)] 97.6 F (36.4 C) (05/25 0807) Pulse Rate:  [59-91] 81 (05/25 0807) Resp:  [14-27] 14 (05/25 0807) BP: (105-182)/(51-97) 158/78 (05/25 0807) SpO2:  [91 %-98 %] 92 % (05/25 0807) Last BM Date: 10/12/19  Intake/Output from previous day: 05/24 0701 - 05/25 0700 In: 220.8 [P.O.:100; I.V.:120.8] Out: 500 [Urine:500] Intake/Output this shift: No intake/output data recorded.  PE: Gen:  Alert, NAD, pleasant Pulm:  rate and effort normal Abd: Soft, non-tender, mild distention, bowel sounds present in all 4 quadrants Skin: warm and dry, no rashes   Lab Results:  Recent Labs    10/12/19 2258 10/13/19 0258  WBC 11.8* 7.4  HGB 11.3* 10.5*  HCT 34.7* 31.9*  PLT 229 203   BMET Recent Labs    10/12/19 2258 10/13/19 0258  NA 129* 131*  K 4.6 4.0  CL 94* 94*  CO2 22 26  GLUCOSE 126* 102*  BUN 15 14  CREATININE 0.98 1.04*  CALCIUM 8.4* 8.4*   PT/INR No results for input(s): LABPROT, INR in the last 72 hours. CMP     Component Value Date/Time   NA 131 (L) 10/13/2019 0258   NA 135 (L) 05/21/2017 0829   K 4.0 10/13/2019 0258   K 3.6 05/21/2017 0829   CL 94 (L) 10/13/2019 0258   CO2 26 10/13/2019 0258   CO2 27 05/21/2017 0829   GLUCOSE 102 (H) 10/13/2019 0258   GLUCOSE 162 (H) 05/21/2017 0829   BUN 14 10/13/2019 0258   BUN 22.6 05/21/2017 0829   CREATININE 1.04 (H) 10/13/2019 0258   CREATININE 1.22 (H) 10/03/2019 0953   CREATININE 1.1 05/21/2017 0829   CALCIUM 8.4 (L) 10/13/2019 0258   CALCIUM 11.3 (H) 05/21/2017 0829   PROT 6.4 (L) 10/12/2019 1503   PROT 6.7 05/21/2017 0829   ALBUMIN 2.9 (L) 10/12/2019 1503   ALBUMIN 2.4 (L) 05/21/2017 0829    AST 18 10/12/2019 1503   AST 22 10/03/2019 0953   AST 14 05/21/2017 0829   ALT 13 10/12/2019 1503   ALT 18 10/03/2019 0953   ALT 15 05/21/2017 0829   ALKPHOS 172 (H) 10/12/2019 1503   ALKPHOS 143 05/21/2017 0829   BILITOT 1.1 10/12/2019 1503   BILITOT 0.6 10/03/2019 0953   BILITOT 0.38 05/21/2017 0829   GFRNONAA 48 (L) 10/13/2019 0258   GFRNONAA 40 (L) 10/03/2019 0953   GFRAA 56 (L) 10/13/2019 0258   GFRAA 46 (L) 10/03/2019 0953   Lipase     Component Value Date/Time   LIPASE 50 10/12/2019 1503       Studies/Results: CT Abdomen Pelvis W Contrast  Result Date: 10/12/2019 CLINICAL DATA:  Suspected bowel obstruction. EXAM: CT ABDOMEN AND PELVIS WITH CONTRAST TECHNIQUE: Multidetector CT imaging of the abdomen and pelvis was performed using the standard protocol following bolus administration of intravenous contrast. CONTRAST:  180mL OMNIPAQUE IOHEXOL 300 MG/ML  SOLN COMPARISON:  None. FINDINGS: Lower chest: Left pleural effusion. Enlarged heart. Atherosclerotic disease of the aorta with calcified and noncalcified plaque. Hepatobiliary: No focal liver abnormality is seen. Status post cholecystectomy. No biliary dilatation. Pancreas: Unremarkable. No pancreatic ductal dilatation or surrounding inflammatory changes. Spleen: Normal in size without  focal abnormality. Adrenals/Urinary Tract: Adrenal glands are unremarkable. Kidneys are normal, without renal calculi, focal lesion, or hydronephrosis. Bladder is unremarkable. Stomach/Bowel: Normal appearance of the stomach and proximal small bowel. Fluid-filled dilated distal small bowel loops measuring up to 4.1 cm transversely. Transitional point is identified in the left mid abdomen. Residual gas and stool in the colon. Vascular/Lymphatic: Aortic atherosclerosis. No enlarged abdominal or pelvic lymph nodes. Reproductive: Status post hysterectomy. No adnexal masses. Other: No abdominal wall hernia or abnormality. No abdominopelvic ascites.  Musculoskeletal: Moderate in severity compression fracture of T10 vertebral body, which was not present on the chest CT dated Oct 03, 2019 chronic T11 compression fracture. IMPRESSION: 1. Early/incomplete small bowel obstruction with transition point in the left mid abdomen. 2. Moderate in severity compression fracture of T10 vertebral body, new from Oct 03, 2019. 3. Stable chronic T12 compression fracture. 4. Left pleural effusion. 5. Enlarged heart. Aortic Atherosclerosis (ICD10-I70.0). Electronically Signed   By: Fidela Salisbury M.D.   On: 10/12/2019 18:57   DG Chest Port 1 View  Result Date: 10/12/2019 CLINICAL DATA:  Shortness of breath EXAM: PORTABLE CHEST 1 VIEW COMPARISON:  CT scan Oct 03, 2019 FINDINGS: Opacity in the left upper lobe is at the site of the patient's known treated malignancy and likely represents post treatment change. Probable small left effusion. Mild edema identified. No other acute abnormalities. IMPRESSION: 1. Post treatment changes in the left upper lung. Mild edema and small left effusion. Electronically Signed   By: Dorise Bullion III M.D   On: 10/12/2019 19:16   DG Abd Portable 1V  Result Date: 10/13/2019 CLINICAL DATA:  SBO EXAM: PORTABLE ABDOMEN - 1 VIEW COMPARISON:  CT 10/12/2019, abdominal radiograph 01/08/2018 FINDINGS: High attenuation contrast material has traversed to the level of the transverse colon. There is a single air distended loop of small bowel in the mid abdomen, favor small bowel given the appearance of the CT. No other air distended loops of small bowel are evident to suggest high-grade small bowel obstruction. Excreted contrast media is seen within the bladder. The osseous structures appear diffusely demineralized which may limit detection of small or nondisplaced fractures. Prior right hip hemiarthroplasty. Multilevel degenerative changes noted in the spine as well as in the hips. No acute or worrisome osseous lesion. IMPRESSION: 1. High attenuation  contrast material has traversed to the level of the transverse colon. 2. Single air distended loop of small bowel in the mid abdomen, favor small bowel given the appearance of the CT though no direct correlate is seen. A closed loop at this level is not fully excluded though no upstream dilatation of bowel is present. Electronically Signed   By: Lovena Le M.D.   On: 10/13/2019 05:29   VAS [place] LOWER EXTREMITY VENOUS (DVT)  Result Date: 10/13/2019  Lower Venous DVTStudy Indications: Edema.  Risk Factors: None identified. Comparison Study: No prior studies. Performing Technologist: Oliver Hum RVT  Examination Guidelines: A complete evaluation includes B-mode imaging, spectral Doppler, color Doppler, and power Doppler as needed of all accessible portions of each vessel. Bilateral testing is considered an integral part of a complete examination. Limited examinations for reoccurring indications may be performed as noted. The reflux portion of the exam is performed with the patient in reverse Trendelenburg.  +---------+---------------+---------+-----------+----------+--------------+ RIGHT    CompressibilityPhasicitySpontaneityPropertiesThrombus Aging +---------+---------------+---------+-----------+----------+--------------+ CFV      Full           Yes      Yes                                 +---------+---------------+---------+-----------+----------+--------------+  SFJ      Full                                                        +---------+---------------+---------+-----------+----------+--------------+ FV Prox  Full                                                        +---------+---------------+---------+-----------+----------+--------------+ FV Mid   Full                                                        +---------+---------------+---------+-----------+----------+--------------+ FV DistalFull                                                         +---------+---------------+---------+-----------+----------+--------------+ PFV      Full                                                        +---------+---------------+---------+-----------+----------+--------------+ POP      Full           Yes      Yes                                 +---------+---------------+---------+-----------+----------+--------------+ PTV      Full                                                        +---------+---------------+---------+-----------+----------+--------------+ PERO     Full                                                        +---------+---------------+---------+-----------+----------+--------------+ There is evidence of high grade stenosis/occlusion in the superficial femoral artery. Arterial flow appears patent in the popliteal, posterior tibial, peroneal, and anterior tibial arteries.  +---------+---------------+---------+-----------+----------+--------------+ LEFT     CompressibilityPhasicitySpontaneityPropertiesThrombus Aging +---------+---------------+---------+-----------+----------+--------------+ CFV      Full           Yes      Yes                                 +---------+---------------+---------+-----------+----------+--------------+ SFJ      Full                                                        +---------+---------------+---------+-----------+----------+--------------+  FV Prox  Full                                                        +---------+---------------+---------+-----------+----------+--------------+ FV Mid   Full                                                        +---------+---------------+---------+-----------+----------+--------------+ FV DistalFull                                                        +---------+---------------+---------+-----------+----------+--------------+ PFV      Full                                                         +---------+---------------+---------+-----------+----------+--------------+ POP      Full           Yes      Yes                                 +---------+---------------+---------+-----------+----------+--------------+ PTV      Full                                                        +---------+---------------+---------+-----------+----------+--------------+ PERO     Full                                                        +---------+---------------+---------+-----------+----------+--------------+     Summary: RIGHT: - There is no evidence of deep vein thrombosis in the lower extremity.  - No cystic structure found in the popliteal fossa. - There is evidence of high grade stenosis/occlusion in the superficial femoral artery. Arterial flow appears patent in the popliteal, posterior tibial, peroneal, and anterior tibial arteries.  LEFT: - There is no evidence of deep vein thrombosis in the lower extremity.  - No cystic structure found in the popliteal fossa.  *See table(s) above for measurements and observations. Electronically signed by Ruta Hinds MD on 10/13/2019 at 6:35:14 PM.    Final     Anti-infectives: Anti-infectives (From admission, onward)   Start     Dose/Rate Route Frequency Ordered Stop   10/13/19 0830  Ampicillin-Sulbactam (UNASYN) 3 g in sodium chloride 0.9 % 100 mL IVPB  Status:  Discontinued    Note to Pharmacy: Unasyn 3 g IV q12h for CrCl < 36mL/min   3 g 200 mL/hr over 30 Minutes Intravenous 2 times daily 10/13/19  4097 10/13/19 1336   10/13/19 0230  piperacillin-tazobactam (ZOSYN) IVPB 3.375 g  Status:  Discontinued     3.375 g 12.5 mL/hr over 240 Minutes Intravenous Every 8 hours 10/12/19 2024 10/13/19 0726   10/12/19 2030  piperacillin-tazobactam (ZOSYN) IVPB 3.375 g     3.375 g 100 mL/hr over 30 Minutes Intravenous  Once 10/12/19 2024 10/12/19 2104       Assessment/Plan Acute hypoxic respiratory failure  RLL PNA - possibly related to  aspiration Chronic left dependent MPE  LUL NSCLC-SCC stage IIA; c/b radiation esophagitis  NSTEMI w/ VT on admission - hep gtt, daily ASA  SBO  - PSH appendectomy, laparoscopic cholecystectomy, abd hysterectomy - contrast in the colon on KUB, radiologist raised the question of a closed loop obstruction. Low suspicion for this given benign abdominal exam, normal lactate, and single transition point in left mid abdomen on CT scan - having BMs but no reliable flatus  Plan: Patient is transitioning to comfort care. She is tolerating clear liquids and bowel function returning. Will advance to full liquids, advance diet as tolerated. General surgery will sign off, call with concerns.    LOS: 2 days    Wellington [place], [facility] 10/14/2019, 8:41 AM Please see Amion for pager number during day hours 7:00am-4:30pm

## 2019-10-14 NOTE — Discharge Instructions (Signed)
Hospice will come to your home to assess you 10/15/2019.  Please call [place] with any needs after discharge home.Sherian Rein)

## 2019-10-14 NOTE — Plan of Care (Signed)

## 2019-10-15 ENCOUNTER — Telehealth: Payer: Self-pay | Admitting: Acute Care

## 2019-10-15 ENCOUNTER — Other Ambulatory Visit: Payer: Self-pay | Admitting: Acute Care

## 2019-10-15 ENCOUNTER — Telehealth: Payer: Self-pay | Admitting: Medical Oncology

## 2019-10-15 DIAGNOSIS — C3490 Malignant neoplasm of unspecified part of unspecified bronchus or lung: Secondary | ICD-10-CM

## 2019-10-15 MED ORDER — LORAZEPAM 1 MG PO TABS: 1.0000 mg | ORAL_TABLET | ORAL | 0 refills | Status: AC | PRN

## 2019-10-15 MED ORDER — MORPHINE SULFATE (CONCENTRATE) 10 MG /0.5 ML PO SOLN: 5.0000 mg | ORAL | 0 refills | Status: AC | PRN

## 2019-10-15 NOTE — Telephone Encounter (Signed)
Pt's son returning call.  [phone].

## 2019-10-15 NOTE — Telephone Encounter (Signed)
Steva Ready daughter is son returning phone call. Kim phone number is [phone].

## 2019-10-15 NOTE — Telephone Encounter (Signed)
Called and spoke with pt's son [name] who stated when pt was discharged from the hospital, there was an Rx printed for morphine sulfate and lorazepam which were prescribed by SG at discharge.  When they took the Rx sheet to the pharmacy to get the meds filled for pt, they were not able to get it taken care of as neither of the meds on the Rx sheet had a signature from [place] on it.  [name] is requesting these to be sent electronically to the pharmacy by Judson Roch. Pharmacy that these meds need to be sent to is [facility].  Sarah, please advise if you are okay e-signing the meds sending them to pt's pharmacy since the printed Rx was not signed.  meds have been pended.

## 2019-10-15 NOTE — Telephone Encounter (Signed)
Called Hospice and told Butch Penny -that Dr Julien Nordmann will be attending.

## 2019-10-15 NOTE — Telephone Encounter (Signed)
[name] son is returning phone call. [name] phone number is [phone].

## 2019-10-15 NOTE — Telephone Encounter (Signed)
Attempted to call pt but unable to reach. Left message for pt to return call. 

## 2019-10-17 LAB — CULTURE, BLOOD (ROUTINE X 2)
Culture: NO GROWTH
Culture: NO GROWTH
Special Requests: ADEQUATE

## 2019-10-21 NOTE — Telephone Encounter (Signed)
Called and spoke with patient's son. He stated that his mother did receive her medications. Nothing further needed at time of call.

## 2019-11-26 ENCOUNTER — Telehealth: Payer: Self-pay | Admitting: Medical Oncology

## 2019-11-26 NOTE — Telephone Encounter (Signed)
Gave pt the number to [facility].

## 2020-02-05 ENCOUNTER — Telehealth: Payer: Self-pay | Admitting: Medical Oncology

## 2020-02-05 NOTE — Telephone Encounter (Signed)
status of lung cancer.  I told her the only way to see status is via scans. The patient is not up to that. I told her friend that the lung cancer was fairly stable on last CT and that the  primary issue is her SBO .  I told her to call if she wanted an appt with [facility].

## 2020-04-02 ENCOUNTER — Telehealth: Payer: Self-pay | Admitting: *Deleted

## 2020-04-02 NOTE — Telephone Encounter (Signed)
I received a call from central scheduling that patient does not want her scan.  I looked at a few notes and I am not clear if she is on Hospice care or not.  I called the son and left a vm message to call with an update.

## 2020-04-02 NOTE — Telephone Encounter (Signed)
I received a call back from patient's son stating that she does not want to have scans.  Patient is on Hospice care and is seen at home 4 days a week by them.  I called radiology to cancel her scan.

## 2020-04-05 ENCOUNTER — Inpatient Hospital Stay: Payer: Medicare Other

## 2020-04-05 ENCOUNTER — Ambulatory Visit (HOSPITAL_COMMUNITY): Payer: Medicare Other

## 2020-04-07 ENCOUNTER — Inpatient Hospital Stay: Payer: Medicare Other | Attending: Internal Medicine | Admitting: Internal Medicine

## 2021-05-05 ENCOUNTER — Encounter (HOSPITAL_COMMUNITY): Payer: Self-pay

## 2021-05-05 ENCOUNTER — Emergency Department (HOSPITAL_COMMUNITY)

## 2021-05-05 ENCOUNTER — Emergency Department (HOSPITAL_COMMUNITY)
Admission: EM | Admit: 2021-05-05 | Discharge: 2021-05-06 | Disposition: A | Discharge status: Home / Self Care | Attending: Emergency Medicine | Admitting: Emergency Medicine

## 2021-05-05 ENCOUNTER — Other Ambulatory Visit: Payer: Self-pay

## 2021-05-05 DIAGNOSIS — I10 Essential (primary) hypertension: Secondary | ICD-10-CM | POA: Diagnosis not present

## 2021-05-05 DIAGNOSIS — Z85118 Personal history of other malignant neoplasm of bronchus and lung: Secondary | ICD-10-CM | POA: Diagnosis not present

## 2021-05-05 DIAGNOSIS — F17210 Nicotine dependence, cigarettes, uncomplicated: Secondary | ICD-10-CM | POA: Diagnosis not present

## 2021-05-05 DIAGNOSIS — R000 Tachycardia, unspecified: Secondary | ICD-10-CM | POA: Insufficient documentation

## 2021-05-05 DIAGNOSIS — Z515 Encounter for palliative care: Secondary | ICD-10-CM | POA: Insufficient documentation

## 2021-05-05 DIAGNOSIS — U071 COVID-19: Secondary | ICD-10-CM | POA: Diagnosis not present

## 2021-05-05 DIAGNOSIS — R0902 Hypoxemia: Secondary | ICD-10-CM | POA: Insufficient documentation

## 2021-05-05 DIAGNOSIS — I2510 Atherosclerotic heart disease of native coronary artery without angina pectoris: Secondary | ICD-10-CM | POA: Insufficient documentation

## 2021-05-05 DIAGNOSIS — Z79899 Other long term (current) drug therapy: Secondary | ICD-10-CM | POA: Insufficient documentation

## 2021-05-05 NOTE — ED Notes (Signed)
Pt top denture and glasses at bedside.

## 2021-05-05 NOTE — ED Triage Notes (Signed)
BIB [facility] from home. Family called out due to pt having labored breathing and shortness of breath. Pt was 72% on room air. Placed on CPAP with EMS. Prior to arrival pt received 2 SL nitro from EMS.   230/128 40RR 90s on CPAP 120 heart rate CBG 141  20 G L FA

## 2021-05-05 NOTE — ED Provider Notes (Signed)
Patient care assumed at 2330.  Patient currently on hospice, came in for shortness of breath and hypoxia.  Symptoms have improved during ED stay and on supplemental oxygen.  Plan to continue to wean down oxygen with case management consult for assistance in obtaining increased hospice services at home.  Called to bedside because patient is tachycardic.  On assessment patient is tachycardic, no acute distress.  She is asking when she gets to go home.  She is on 2 L nasal cannula.  COVID test is positive today.  Discussed results with patient.  Is unclear when she developed symptoms.  She does not want any blood work.  She is not a good pax of a candidate due to no recent blood work and unclear duration of her symptoms.  Updated patient's granddaughter COVID-19 status and current working plan to discharge home.  Granddaughter does state that she has concerns about safety of home oxygen due to both the patient and her aunt smoking heavily.  We will see what hospice resources are available for the patient.  We will continue to plan to discharge home when a safe plan can be arranged.  Patient care transferred pending hospice recommendations.   Quintella Reichert, MD 05/06/21 [phone]

## 2021-05-05 NOTE — ED Provider Notes (Signed)
[facility] EMERGENCY DEPARTMENT Provider Note   CSN: [id number] Arrival date & time: 05/05/21  2128     History Chief Complaint  Patient presents with   Respiratory Distress    [name] is a 87 y.o. female.  HPI Level 5 caveat due to respiratory distress. Patient brought in for hypoxia.  Sats reportedly in the 70%.  Brought in on CPAP by EMS.  Unsure history.  Patient somewhat difficult to get history from. Reviewing records appears as if she is in hospice or at least was placed there in May of last year.  EMS not been informed of her hospice status.    Past Medical History:  Diagnosis Date   Abdominal aortic atherosclerosis (HCC)    CAD (coronary artery disease)    Cancer (HCC)    Carotid artery occlusion    Common bile duct dilation    Gallstones    Headache(784.0)    Hiatal hernia    HLD (hyperlipidemia)    Myocardial infarction (HCC)    PONV (postoperative nausea and vomiting)    Stroke [facility]) 2003    Patient Active Problem List   Diagnosis Date Noted   Pressure injury of skin 10/13/2019   Adult failure to thrive    DNR (do not resuscitate)    Palliative care by specialist    Bowel obstruction ([employer]) 10/12/2019   Respiratory distress    Elevated troponin    Drug-induced polyneuropathy ([place]) 09/30/2019   Sensorineural hearing loss (SNHL), bilateral 02/13/2018   Malnutrition of moderate degree 01/08/2018   SBO (small bowel obstruction) ([place]) 01/06/2018   Community acquired pneumonia    Hypercalcemia 05/21/2017   MDD (major depressive disorder), recurrent episode, moderate ([place]) 05/11/2017   Dehydration 05/10/2017   Weakness generalized 05/10/2017   Encounter for antineoplastic chemotherapy 05/04/2017   Goals of care, counseling/discussion 05/04/2017   Malignant neoplasm of bronchus of left upper lobe ([place]) 05/03/2017   Chest congestion 04/03/2017   Constipation 04/03/2017   Adenoma of left adrenal gland 04/03/2017   SIADH  (syndrome of inappropriate ADH production) ([place]) 04/02/2017   Lung mass 04/02/2017   Elevated AST (SGOT) 04/02/2017   Leukocytosis 04/02/2017   Normocytic anemia 04/02/2017   Cerumen impaction 11/19/2015   Hip fracture requiring operative repair ([place]) 01/09/2014   Closed right hip fracture ([place]) 01/09/2014   Hyponatremia 01/09/2014   Symptomatic cholelithiasis 03/20/2013   Dilated bile duct 03/20/2013   Abdominal aortic atherosclerosis ([place])    Hypertension 10/19/2011   Tobacco abuse 02/18/2009   HLD (hyperlipidemia) 02/17/2009   ANXIETY DISORDER 02/17/2009   CAD (coronary artery disease) 02/17/2009   CAROTID ARTERY OCCLUSION 02/17/2009   CVA 02/17/2009    Past Surgical History:  Procedure Laterality Date   ABDOMINAL HYSTERECTOMY     ANKLE FRACTURE SURGERY     APPENDECTOMY     BLADDER SURGERY     CHOLECYSTECTOMY N/A 05/05/2013   Procedure: LAPAROSCOPIC CHOLECYSTECTOMY WITH INTRAOPERATIVE CHOLANGIOGRAM;  Surgeon: Ralene Ok, MD;  Location: [facility];  Service: General;  Laterality: N/A;   HIP ARTHROPLASTY Right 01/10/2014   Procedure: RIGHT HIP HEMIARTHROPLASTY;  Surgeon: Johnn Hai, MD;  Location: [facility];  Service: Orthopedics;  Laterality: Right;  DEPUY AML LATERAL WITH MARK II   ROTATOR CUFF REPAIR Left    TONSILLECTOMY       OB History   No obstetric history on file.     Family History  Problem Relation Age of Onset   Stroke Mother  Breast cancer Maternal Aunt    Prostate cancer Maternal Grandfather    Neuropathy Neg Hx     Social History   Tobacco Use   Smoking status: Every Day    Packs/day: 0.50    Types: Cigarettes   Smokeless tobacco: Never  Vaping Use   Vaping Use: Never used  Substance Use Topics   Alcohol use: Not Currently   Drug use: No    Home Medications Prior to Admission medications   Medication Sig Start Date End Date Taking? Authorizing Provider  escitalopram (LEXAPRO) 10 MG tablet Take 10 mg by mouth daily. 02/11/19    [provider]  Morphine Sulfate (MORPHINE CONCENTRATE) 10 mg / 0.5 ml concentrated solution Take 0.25 mLs (5 mg total) by mouth every 2 (two) hours as needed for severe pain. 10/15/19   Magdalen Spatz, NP    Allergies    Dextroamphetamine sulfate er  Review of Systems   Review of Systems  Unable to perform ROS: Severe respiratory distress   Physical Exam Updated Vital Signs BP (!) 148/75    Pulse 96    Temp 97.6 F (36.4 C) (Axillary)    Resp (!) 24    SpO2 93%   Physical Exam Vitals reviewed.  HENT:     Head: Normocephalic.  Cardiovascular:     Rate and Rhythm: Tachycardia present.  Pulmonary:     Comments: Diffuse harsh breath sounds. Abdominal:     Tenderness: There is no abdominal tenderness.  Musculoskeletal:        General: No tenderness.  Skin:    Capillary Refill: Capillary refill takes less than 2 seconds.  Neurological:     Mental Status: She is alert.     Comments: Difficult to get history from secondary to respiratory distress.    ED Results / Procedures / Treatments   Labs (all labs ordered are listed, but only abnormal results are displayed) Labs Reviewed  RESP PANEL BY RT-PCR (FLU A&B, COVID) [id number]    EKG None  Radiology DG Chest Portable 1 View  Result Date: 05/05/2021 CLINICAL DATA:  Labored breathing, shortness of breath, desaturation 72% on room air. History coronary artery disease post MI, smoker, lung cancer LEFT upper lobe EXAM: PORTABLE CHEST 1 VIEW COMPARISON:  Portable exam 2150 hours compared to 10/12/2019 FINDINGS: Normal heart size and pulmonary vascularity. Volume loss and scarring in the RIGHT upper lobe likely related to lung cancer and treatment. Atherosclerotic calcification aorta. Emphysematous changes with chronic accentuation of interstitial markings. Subsegmental atelectasis RIGHT lung base with scattered skin folds over the RIGHT hemithorax. No definite acute infiltrate. IMPRESSION: Chronic accentuation of interstitial  markings bilaterally with minimal subsegmental atelectasis at RIGHT base. Chronic volume loss and scarring in LEFT upper lobe from known lung cancer and radiation therapy. No definite acute infiltrate. Electronically Signed   By: Lavonia Dana M.D.   On: 05/05/2021 22:01    Procedures Procedures   Medications Ordered in ED Medications - No data to display  ED Course  I have reviewed the triage vital signs and the nursing notes.  Pertinent labs & imaging results that were available during my care of the patient were reviewed by me and considered in my medical decision making (see chart for details).    MDM Rules/Calculators/A&P                         Patient brought in with hypoxia and respiratory distress.  Brought in on CPAP.  Reviewing records patient is on hospice.  Discussed with the hospice provider.  Patient is still in hospice and on comfort care only.  Discussed with the patient's granddaughter and she states that her and is having a hard time dealing with that the patient is in hospice.  Patient has been significant that she does not really want to come in the hospital and does not want extraordinary measures.  Have been able to titrate off BiPAP.  Now on nasal cannula.  However patient was somewhat scared by the event.  She is willing to stay in the ER for now.  Will have patient seen by transitions of care and potentially palliative medicine providers tomorrow if needed.  May need some oxygen at home for symptom relief.  Care turned over to Dr. Ralene Bathe.    Final Clinical Impression(s) / ED Diagnoses Final diagnoses:  Hypoxia  Palliative care status    Rx / DC Orders ED Discharge Orders     None        Davonna Belling, MD 05/05/21 2357

## 2021-05-05 NOTE — Progress Notes (Signed)
RT removed BIPAP per verbal order. Patient resting comfortably on room air. SPO2 100%. RR 18. HR 94. RT will monitor as needed.

## 2021-05-06 LAB — RESP PANEL BY RT-PCR (FLU A&B, COVID) ARPGX2
Influenza A by PCR: NEGATIVE
Influenza B by PCR: NEGATIVE
SARS Coronavirus 2 by RT PCR: POSITIVE — AB

## 2021-05-06 MED ORDER — MOLNUPIRAVIR EUA 200MG CAPSULE: 4.0000 | ORAL_CAPSULE | Freq: Two times a day (BID) | ORAL | 0 refills | Status: AC

## 2021-05-06 MED ORDER — NIRMATRELVIR/RITONAVIR (PAXLOVID)TABLET: 3.0000 | ORAL_TABLET | Freq: Two times a day (BID) | ORAL | 0 refills | Status: DC

## 2021-05-06 NOTE — Progress Notes (Signed)
Manufacturing engineer [facility]) Hospice  Visited Ms. Wolden at the bedside, discussed her wishes and the conferred with her POA April.   Ms. Pincock would like to dc home. She has maintained her O2 saturation. She is hesitant to accept O2 in the home due to heavy smoking and the size of the home. She was in agreement to let her hospice homecare nurse (whom she has been working with for almost two years) come to see her later today and assess if she needs it or not at that time.  Please use [facility] for ambulance transport as they contract this service for our active hospice patients.  Updated care team of plans via epic chat.   Thank you, Venia Carbon RN, BSN, [facility] Liaison

## 2021-05-06 NOTE — Care Management (Addendum)
Patient reviewed on 2LPM of oxygen. Messaged RN regarding oxygen qualifications. Patient is COVID positive. Niece has a safety concern as people smoke heavily in the home. Patient is currently on hospice.   Messaged with Hospice ( [facility]) Patient is saturating well on room air. Hospice spoke to April, niece and patient. The patient stated she probably would not wear oxygen anyway. NO o2 needed at this time. The patient will DC home via [facility].

## 2021-05-06 NOTE — ED Notes (Signed)
EMS forgot pt teeth, granddaughter April was called and advised that this nurse would leave them with the charge nurse. April advised someone would come and pick them up this evening. Teeth left at charge desk.

## 2021-05-06 NOTE — ED Notes (Signed)
MD made aware about pt heart rate. Pt is not complaining of pain and is mentally at baseline

## 2021-05-06 NOTE — ED Notes (Signed)
Pt unable to ambulate. Attempted to ambulate using a walker, pt states that she is usually able to walk at home but is unable to do so now. Pt states "I forgot how to walk", unsteady and leaned against walker as if she were about to fall. Pt assisted back to bed. RN made aware.

## 2021-05-06 NOTE — ED Notes (Signed)
[facility] called to transport patient home

## 2021-05-17 ENCOUNTER — Telehealth: Payer: Self-pay

## 2021-05-17 NOTE — Telephone Encounter (Signed)
Fax received from [facility] advising that pt passed 05/23/21 at 8:24pm.

## 2021-05-22 DEATH — deceased
